# Patient Record
Sex: Female | Born: 1985 | Race: White | Hispanic: No | Marital: Single | Smoking: Current some day smoker
Health system: Southern US, Community
[De-identification: ages and names within clinical notes are randomized; demographics above are authoritative.]

## PROBLEM LIST (undated history)

## (undated) ENCOUNTER — Inpatient Hospital Stay (HOSPITAL_COMMUNITY): Payer: Self-pay

## (undated) ENCOUNTER — Inpatient Hospital Stay: Payer: Self-pay

## (undated) DIAGNOSIS — Z8489 Family history of other specified conditions: Secondary | ICD-10-CM

## (undated) DIAGNOSIS — K649 Unspecified hemorrhoids: Secondary | ICD-10-CM

## (undated) DIAGNOSIS — J45909 Unspecified asthma, uncomplicated: Secondary | ICD-10-CM

## (undated) DIAGNOSIS — R002 Palpitations: Secondary | ICD-10-CM

## (undated) DIAGNOSIS — Z720 Tobacco use: Secondary | ICD-10-CM

## (undated) DIAGNOSIS — B999 Unspecified infectious disease: Secondary | ICD-10-CM

## (undated) DIAGNOSIS — K219 Gastro-esophageal reflux disease without esophagitis: Secondary | ICD-10-CM

## (undated) DIAGNOSIS — R079 Chest pain, unspecified: Secondary | ICD-10-CM

## (undated) DIAGNOSIS — Z9289 Personal history of other medical treatment: Secondary | ICD-10-CM

## (undated) DIAGNOSIS — K5792 Diverticulitis of intestine, part unspecified, without perforation or abscess without bleeding: Secondary | ICD-10-CM

## (undated) DIAGNOSIS — Z973 Presence of spectacles and contact lenses: Secondary | ICD-10-CM

## (undated) DIAGNOSIS — R87629 Unspecified abnormal cytological findings in specimens from vagina: Secondary | ICD-10-CM

## (undated) DIAGNOSIS — I10 Essential (primary) hypertension: Secondary | ICD-10-CM

## (undated) HISTORY — DX: Palpitations: R00.2

## (undated) HISTORY — DX: Chest pain, unspecified: R07.9

## (undated) HISTORY — DX: Personal history of other medical treatment: Z92.89

## (undated) HISTORY — DX: Diverticulitis of intestine, part unspecified, without perforation or abscess without bleeding: K57.92

## (undated) HISTORY — DX: Tobacco use: Z72.0

## (undated) HISTORY — DX: Essential (primary) hypertension: I10

## (undated) HISTORY — PX: TONSILLECTOMY: SUR1361

---
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
## 2006-10-19 ENCOUNTER — Ambulatory Visit (HOSPITAL_COMMUNITY): Admission: RE | Admit: 2006-10-19 | Discharge: 2006-10-19 | Payer: Self-pay | Admitting: Obstetrics

## 2008-01-23 ENCOUNTER — Emergency Department (HOSPITAL_COMMUNITY): Admission: EM | Admit: 2008-01-23 | Discharge: 2008-01-23 | Payer: Self-pay | Admitting: *Deleted

## 2008-01-25 ENCOUNTER — Ambulatory Visit: Payer: Self-pay | Admitting: Gastroenterology

## 2008-01-25 DIAGNOSIS — R112 Nausea with vomiting, unspecified: Secondary | ICD-10-CM

## 2008-01-25 DIAGNOSIS — R197 Diarrhea, unspecified: Secondary | ICD-10-CM | POA: Insufficient documentation

## 2008-01-25 DIAGNOSIS — R110 Nausea: Secondary | ICD-10-CM | POA: Insufficient documentation

## 2008-01-25 LAB — CONVERTED CEMR LAB: Tissue Transglutaminase Ab, IgA: 0.3 units (ref <7)

## 2008-01-31 LAB — CONVERTED CEMR LAB
BUN: 11 mg/dL (ref 6–23)
CO2: 28 meq/L (ref 19–32)
Calcium: 9 mg/dL (ref 8.4–10.5)
Chloride: 107 meq/L (ref 96–112)
Creatinine, Ser: 0.5 mg/dL (ref 0.4–1.2)
GFR calc Af Amer: 198 mL/min
GFR calc non Af Amer: 164 mL/min
Glucose, Bld: 104 mg/dL — ABNORMAL HIGH (ref 70–99)
IgA: 249 mg/dL (ref 68–378)
Potassium: 3.3 meq/L — ABNORMAL LOW (ref 3.5–5.1)
Sed Rate: 24 mm/hr — ABNORMAL HIGH (ref 0–22)
Sodium: 140 meq/L (ref 135–145)
TSH: 1.64 microintl units/mL (ref 0.35–5.50)

## 2008-02-21 ENCOUNTER — Ambulatory Visit: Payer: Self-pay | Admitting: Gastroenterology

## 2008-02-21 LAB — CONVERTED CEMR LAB
BUN: 17 mg/dL (ref 6–23)
CO2: 30 meq/L (ref 19–32)
Calcium: 9.6 mg/dL (ref 8.4–10.5)
Chloride: 105 meq/L (ref 96–112)
Creatinine, Ser: 0.5 mg/dL (ref 0.4–1.2)
GFR calc Af Amer: 198 mL/min
GFR calc non Af Amer: 164 mL/min
Glucose, Bld: 93 mg/dL (ref 70–99)
Potassium: 4.4 meq/L (ref 3.5–5.1)
Sodium: 142 meq/L (ref 135–145)

## 2008-02-22 ENCOUNTER — Ambulatory Visit: Payer: Self-pay | Admitting: Gastroenterology

## 2010-02-04 NOTE — Assessment & Plan Note (Signed)
History of Present Illness Visit Type: consult Primary GI MD: Rob Bunting MD Primary Provider: Holley Bouche, MD Requesting Provider: Holley Bouche, MD Chief Complaint: diarrhea, vomiting History of Present Illness:      very pleasant 25 year old woman who had recent acute vomiting, and diarrheal illness.  Vomitting and diarrhea started acutely 3 days ago, occured 20 times or so.  No overt bleeding.  Began to slow down after went to ER (immodium, Phenergan, Zofran, hydrocodone for lower abdominal cramps were all given).  Was also told she also had a UTI and was given Abx. Ct of chest done for elevated d dimer and hyperventilation, was negative.  NEver had this happen before.  labs in the ER showed elevated Lawal count of 17,000, hemoconcentration (elevated hemoglobin), abnormal urinalysis.  since then her symptoms have definitely improved however last night she had some or diarrhea.  No known sick contacts.  For about a month prior to this, would have to move bowels fairly soon after eating meal.  Greasy meals definitely worse, red meat as well.  A year ago she would once or twice a day.  Has been drinking less caffeine lately.  No antibiotics in past several months except for UTI abx given by er 2-3 days ago.             Prior Medications Reviewed Using: Medication Bottles  Updated Prior Medication List: ZOFRAN ODT 8 MG TBDP (ONDANSETRON) 1 by mouth q 4-6 hours as needed NORCO 5-325 MG TABS (HYDROCODONE-ACETAMINOPHEN) 1-2 by mouth q 4-6 hours  as needed NITROFURANTOIN MACROCRYSTAL 100 MG CAPS (NITROFURANTOIN MACROCRYSTAL) Take 1 cap two times a day for seven days PHENAZOPYRIDINE HCL 200 MG TABS (PHENAZOPYRIDINE HCL) Take 1 tab three times a day for 2 days  Current Allergies: No known allergies   Past Medical History:    Unremarkable    recent obesity  Past Surgical History:    Tonsillectomy    Family History:    Grandmas sister has colon cancer    Diabetes  Heart disease  Social History:    she works as a Diplomatic Services operational officer at [employer], she is single, she has no children, she does not smoke cigarettes, she does not drink alcohol, she drinks one to 2 caffeinated drinks daily    Review of Systems       Pertinent positive and negative review of systems were noted in the above HPI and GI specific review of systems.  All other review of systems was otherwise negative.    Vital Signs:  Patient Profile:   25 Years Old Female Height:     63 inches Weight:      202.50 pounds BMI:     36.00 Pulse rate:   60 / minute Pulse rhythm:   regular BP sitting:   112 / 60  (left arm)  Vitals Entered By: June McMurray CMA (January 25, 2008 9:26 AM)                  Physical Exam  Constitutional: obese otherwise generally well appearing Psychiatric: alert and oriented times 3 Eyes: extraocular movements intact Mouth: oropharynx moist, no lesions Neck: supple, no lymphadenopathy Cardiovascular: heart regular rate and rythm Lungs: CTA bilaterally Abdomen: soft, non-tender, non-distended, no obvious ascites, no peritoneal signs, normal bowel sounds Extremities: no lower extremity edema bilaterally Skin: no lesions on visible extremities     Impression & Recommendations:  Problem # 1:  acute vomiting, diarrheal illness I suspect she had a viral  gastroenteritis. [employer] health commissioners just alerted physicians of another outbreak of [place] virus and so that may be the cause here. She is definitely improving I told her she should expect to be back to her usual within one to 2 more weeks. She can continue her Imodium p.r.n. as well as her Zofran p.r.n.  Problem # 2:  post prandial loose stools for the past 2-3 months she has had postprandial urgency and loose stools. Perhaps she has new lactose intolerance. Perhaps she is developing IBD. I will arrange for her to get a basic set of labs today including a basic metabolic profile,  sedimentation rate, TTG, IgA. She should continue Imodium as needed for now. She will try a lactose free diet in about 2 weeks time to give her further time to recover from her acute illness. If that does not show that she is lactose intolerant then she will begin keeping a food diary. She'll return to see me in 4-6 weeks and sooner if needed.  Other Orders: TLB-Sedimentation Rate (ESR) (85652-ESR) TLB-BMP (Basic Metabolic Panel-BMET) (80048-METABOL) TLB-TSH (Thyroid Stimulating Hormone) (84443-TSH) TLB-IgA (Immunoglobulin A) (82784-IGA) T-Tissue Transglutamase Ab IgA ([id number])   Patient Instructions: 1)  In 2 weeks, completely avoid dairy for 5 days. If you feel better, then have a large dairy meal...if you feel bad again then you are lactose intolerant. 2)  If no changes with dairy free diet, then keep a food diary. 3)  Please schedule a follow-up appointment in 4 to 6 weeks. 4)  You will get lab test(s) done today (esr, bmet, tsh, ttg, Iga).

## 2010-02-04 NOTE — Assessment & Plan Note (Signed)
  GI problem list: 1. Likely viral gastroenteritis, January 2010. Presented with acute vomiting and diarrheal illness that subsided within 2-3 days. Residual dyspepsia, nausea for one to 2 weeks.    History of Present Illness Primary GI MD: Rob Bunting MD Primary Provider: Holley Bouche, MD Requesting Provider: n/a Chief Complaint: 4 week f/u History of Present Illness:     She is perfectly normal now.  No vomitting, no nauseas, no diarrhea.  The mild dyspepsia leading up to her acute GE has also subsided.           Updated Prior Medication List: No Medications Current Allergies (reviewed today): No known allergies   Vital Signs:  Patient Profile:   25 Years Old Female Height:     63 inches Weight:      199.50 pounds BMI:     35.47 Pulse rate:   60 / minute Pulse rhythm:   regular BP sitting:   142 / 64  (right arm) Cuff size:   regular  Vitals Entered By: Christie Nottingham CMA (February 22, 2008 9:35 AM)                  Physical Exam  Constitutional: generally well appearing Psychiatric: alert and oriented times 3 Abdomen: soft, non-tender, non-distended, normal bowel sounds    Impression & Recommendations:  Problem # 1:  DIARRHEA, nausea vomiting I suspect she indeed had a viral gastroenteritis last month. Her severe symptoms resolved after one to 2 days. She was left with post infectious dyspepsia, nausea, slightly loose stools. This is not uncommon. Fortunately her symptoms have completely resolved and she is back to normal. She will return to see me as needed.   Patient Instructions: 1)  Return to see Dr. Christella Hartigan as needed.

## 2010-03-06 ENCOUNTER — Emergency Department (HOSPITAL_COMMUNITY)
Admission: EM | Admit: 2010-03-06 | Discharge: 2010-03-06 | Disposition: A | Discharge status: Home / Self Care | Payer: BC Managed Care – PPO | Attending: Emergency Medicine | Admitting: Emergency Medicine

## 2010-03-06 DIAGNOSIS — R1915 Other abnormal bowel sounds: Secondary | ICD-10-CM | POA: Insufficient documentation

## 2010-03-06 DIAGNOSIS — R197 Diarrhea, unspecified: Secondary | ICD-10-CM | POA: Insufficient documentation

## 2010-03-06 DIAGNOSIS — R109 Unspecified abdominal pain: Secondary | ICD-10-CM | POA: Insufficient documentation

## 2010-03-06 DIAGNOSIS — R112 Nausea with vomiting, unspecified: Secondary | ICD-10-CM | POA: Insufficient documentation

## 2010-03-06 DIAGNOSIS — E860 Dehydration: Secondary | ICD-10-CM | POA: Insufficient documentation

## 2010-03-06 LAB — CBC
HCT: 38.2 % (ref 36.0–46.0)
Hemoglobin: 13.4 g/dL (ref 12.0–15.0)
MCH: 30.9 pg (ref 26.0–34.0)
MCHC: 35.1 g/dL (ref 30.0–36.0)
MCV: 88.2 fL (ref 78.0–100.0)
Platelets: 258 10*3/uL (ref 150–400)
RBC: 4.33 MIL/uL (ref 3.87–5.11)
RDW: 12.2 % (ref 11.5–15.5)
WBC: 13 10*3/uL — ABNORMAL HIGH (ref 4.0–10.5)

## 2010-03-06 LAB — BASIC METABOLIC PANEL
BUN: 10 mg/dL (ref 6–23)
CO2: 24 mEq/L (ref 19–32)
Calcium: 9.9 mg/dL (ref 8.4–10.5)
Chloride: 101 mEq/L (ref 96–112)
Creatinine, Ser: 0.74 mg/dL (ref 0.4–1.2)
GFR calc Af Amer: >60 mL/min (ref >60)
GFR calc non Af Amer: >60 mL/min (ref >60)
Glucose, Bld: 144 mg/dL — ABNORMAL HIGH (ref 70–99)
Potassium: 3.5 mEq/L (ref 3.5–5.1)
Sodium: 138 mEq/L (ref 135–145)

## 2010-03-06 LAB — DIFFERENTIAL
Basophils Absolute: 0 10*3/uL (ref 0.0–0.1)
Basophils Relative: 0 % (ref 0–1)
Eosinophils Absolute: 0 10*3/uL (ref 0.0–0.7)
Eosinophils Relative: 0 % (ref 0–5)
Lymphocytes Relative: 10 % — ABNORMAL LOW (ref 12–46)
Lymphs Abs: 1.3 10*3/uL (ref 0.7–4.0)
Monocytes Absolute: 0.5 10*3/uL (ref 0.1–1.0)
Monocytes Relative: 4 % (ref 3–12)
Neutro Abs: 11.1 10*3/uL — ABNORMAL HIGH (ref 1.7–7.7)
Neutrophils Relative %: 86 % — ABNORMAL HIGH (ref 43–77)

## 2010-03-06 LAB — HEPATIC FUNCTION PANEL
ALT: 37 U/L — ABNORMAL HIGH (ref 0–35)
AST: 26 U/L (ref 0–37)
Albumin: 4.2 g/dL (ref 3.5–5.2)
Alkaline Phosphatase: 82 U/L (ref 39–117)
Bilirubin, Direct: <0.1 mg/dL (ref 0.0–0.3)
Total Bilirubin: 0.7 mg/dL (ref 0.3–1.2)
Total Protein: 7.5 g/dL (ref 6.0–8.3)

## 2010-03-06 LAB — URINALYSIS, ROUTINE W REFLEX MICROSCOPIC
Bilirubin Urine: NEGATIVE
Ketones, ur: >80 mg/dL — AB
Leukocytes, UA: NEGATIVE
Nitrite: NEGATIVE
Protein, ur: 100 mg/dL — AB
Specific Gravity, Urine: 1.025 (ref 1.005–1.030)
Urine Glucose, Fasting: NEGATIVE mg/dL
Urobilinogen, UA: 0.2 mg/dL (ref 0.0–1.0)
pH: 8.5 — ABNORMAL HIGH (ref 5.0–8.0)

## 2010-03-06 LAB — LIPASE, BLOOD: Lipase: 23 U/L (ref 11–59)

## 2010-03-06 LAB — URINE MICROSCOPIC-ADD ON

## 2010-04-21 LAB — URINE MICROSCOPIC-ADD ON

## 2010-04-21 LAB — POCT I-STAT, CHEM 8
BUN: 16 mg/dL (ref 6–23)
Calcium, Ion: 1.11 mmol/L — ABNORMAL LOW (ref 1.12–1.32)
Chloride: 107 mEq/L (ref 96–112)
Creatinine, Ser: 0.8 mg/dL (ref 0.4–1.2)
Glucose, Bld: 163 mg/dL — ABNORMAL HIGH (ref 70–99)
HCT: 44 % (ref 36.0–46.0)
Hemoglobin: 15 g/dL (ref 12.0–15.0)
Potassium: 3.7 mEq/L (ref 3.5–5.1)
Sodium: 142 mEq/L (ref 135–145)
TCO2: 24 mmol/L (ref 0–100)

## 2010-04-21 LAB — URINALYSIS, ROUTINE W REFLEX MICROSCOPIC
Bilirubin Urine: NEGATIVE
Glucose, UA: NEGATIVE mg/dL
Ketones, ur: 15 mg/dL — AB
Nitrite: NEGATIVE
Protein, ur: 30 mg/dL — AB
Specific Gravity, Urine: 1.03 (ref 1.005–1.030)
Urobilinogen, UA: 0.2 mg/dL (ref 0.0–1.0)
pH: 6 (ref 5.0–8.0)

## 2010-04-21 LAB — DIFFERENTIAL
Basophils Absolute: 0 10*3/uL (ref 0.0–0.1)
Basophils Relative: 0 % (ref 0–1)
Eosinophils Absolute: 0.1 10*3/uL (ref 0.0–0.7)
Eosinophils Relative: 1 % (ref 0–5)
Lymphocytes Relative: 8 % — ABNORMAL LOW (ref 12–46)
Lymphs Abs: 1.4 10*3/uL (ref 0.7–4.0)
Monocytes Absolute: 0.6 10*3/uL (ref 0.1–1.0)
Monocytes Relative: 4 % (ref 3–12)
Neutro Abs: 14.8 10*3/uL — ABNORMAL HIGH (ref 1.7–7.7)
Neutrophils Relative %: 88 % — ABNORMAL HIGH (ref 43–77)

## 2010-04-21 LAB — D-DIMER, QUANTITATIVE: D-Dimer, Quant: 0.99 ug/mL-FEU — ABNORMAL HIGH (ref 0.00–0.48)

## 2010-04-21 LAB — CBC
HCT: 45.9 % (ref 36.0–46.0)
Hemoglobin: 15.4 g/dL — ABNORMAL HIGH (ref 12.0–15.0)
MCHC: 33.5 g/dL (ref 30.0–36.0)
MCV: 93.5 fL (ref 78.0–100.0)
Platelets: 283 10*3/uL (ref 150–400)
RBC: 4.91 MIL/uL (ref 3.87–5.11)
RDW: 12.7 % (ref 11.5–15.5)
WBC: 16.9 10*3/uL — ABNORMAL HIGH (ref 4.0–10.5)

## 2010-04-21 LAB — POCT PREGNANCY, URINE: Preg Test, Ur: NEGATIVE

## 2011-04-06 ENCOUNTER — Emergency Department (INDEPENDENT_AMBULATORY_CARE_PROVIDER_SITE_OTHER)
Admission: EM | Admit: 2011-04-06 | Discharge: 2011-04-06 | Disposition: A | Discharge status: Home / Self Care | Payer: BC Managed Care – PPO | Source: Home / Self Care | Attending: Family Medicine | Admitting: Family Medicine

## 2011-04-06 ENCOUNTER — Encounter (HOSPITAL_COMMUNITY): Payer: Self-pay | Admitting: *Deleted

## 2011-04-06 DIAGNOSIS — J302 Other seasonal allergic rhinitis: Secondary | ICD-10-CM

## 2011-04-06 DIAGNOSIS — H6690 Otitis media, unspecified, unspecified ear: Secondary | ICD-10-CM

## 2011-04-06 DIAGNOSIS — J309 Allergic rhinitis, unspecified: Secondary | ICD-10-CM

## 2011-04-06 DIAGNOSIS — H6691 Otitis media, unspecified, right ear: Secondary | ICD-10-CM

## 2011-04-06 MED ORDER — FEXOFENADINE HCL 180 MG PO TABS: 180.0000 mg | ORAL_TABLET | Freq: Every day | ORAL | Status: DC

## 2011-04-06 MED ORDER — FLUTICASONE PROPIONATE 50 MCG/ACT NA SUSP: 2.0000 | Freq: Every day | NASAL | Status: DC

## 2011-04-06 MED ORDER — AMOXICILLIN 500 MG PO CAPS
500.0000 mg | ORAL_CAPSULE | Freq: Three times a day (TID) | ORAL | Status: AC
Start: 2011-04-06 — End: 2011-04-16

## 2011-04-06 NOTE — ED Provider Notes (Addendum)
History     CSN: [id number]  Arrival date & time 04/06/11  1909   First MD Initiated Contact with Patient 04/06/11 1912      Chief Complaint  Patient presents with  . Otalgia    (Consider location/radiation/quality/duration/timing/severity/associated sxs/prior treatment) Patient is a 26 y.o. female presenting with ear pain. The history is provided by the patient and a parent.  Otalgia This is a new problem. The current episode started more than 2 days ago. There is pain in the right (took amox for 5 days last week when left ear became painful, sx improved then moved to right ear , assoc with  seasonal allergy cong.) ear. The problem has been gradually worsening. There has been no fever. The pain is moderate. Associated symptoms include rhinorrhea. Pertinent negatives include no ear discharge.    History reviewed. No pertinent past medical history.  History reviewed. No pertinent past surgical history.  Family History  Problem Relation Age of Onset  . Hypertension Other     History  Substance Use Topics  . Smoking status: Not on file  . Smokeless tobacco: Not on file  . Alcohol Use:     OB History    Grav Para Term Preterm Abortions TAB SAB Ect Mult Living                  Review of Systems  Constitutional: Negative.   HENT: Positive for ear pain, congestion, rhinorrhea, sneezing and postnasal drip. Negative for ear discharge.     Allergies  Review of patient's allergies indicates no known allergies.  Home Medications   Current Outpatient Rx  Name Route Sig Dispense Refill  . ACETAMINOPHEN 500 MG PO TABS Oral Take 500 mg by mouth every 6 (six) hours as needed.    . AMOXICILLIN 250 MG PO CAPS Oral Take 250 mg by mouth 3 (three) times daily.    . AMOXICILLIN 500 MG PO CAPS Oral Take 1 capsule (500 mg total) by mouth 3 (three) times daily. 30 capsule 0  . FEXOFENADINE HCL 180 MG PO TABS Oral Take 1 tablet (180 mg total) by mouth daily. 30 tablet 2  . FLUTICASONE  PROPIONATE 50 MCG/ACT NA SUSP Nasal Place 2 sprays into the nose daily. 1 g 2    BP 146/88  Pulse 60  Temp(Src) 98.1 F (36.7 C) (Oral)  Resp 16  SpO2 100%  LMP 03/29/2011  Physical Exam  Nursing note and vitals reviewed. Constitutional: She appears well-developed and well-nourished.  HENT:  Head: Normocephalic.  Right Ear: External ear and ear canal normal. Tympanic membrane is injected, erythematous and bulging. A middle ear effusion is present.  Left Ear: External ear and ear canal normal. Tympanic membrane is not injected, not erythematous and not bulging. A middle ear effusion is present.  Nose: Mucosal edema and rhinorrhea present.  Mouth/Throat: Oropharynx is clear and moist.  Pulmonary/Chest: Breath sounds normal.  Skin: Skin is warm and dry.    ED Course  Procedures (including critical care time)  Labs Reviewed - No data to display No results found.   1. Otitis media of right ear   2. Seasonal allergic rhinitis       MDM          Linna Hoff, MD 04/06/11 [id number]  Linna Hoff, MD 04/06/11 2050

## 2011-04-06 NOTE — ED Notes (Signed)
Pt  Reports  She  Has  Had   An earache  r  Side      Associated  With  Symptoms  Of  Upper  resp  Congestion  Sinus  stuffyness       X   1  Week  She  Reports  She  Has  Been taking OTC meds   As  Well  As  amox  She  Had         On hand    She  Exhibits normal  behabviourr  Sitting  Upright on exam table  Speaking in  Complete  sentances  In no severe  distress

## 2012-10-01 ENCOUNTER — Encounter (HOSPITAL_COMMUNITY): Payer: Self-pay

## 2012-10-01 ENCOUNTER — Emergency Department (HOSPITAL_COMMUNITY)
Admission: EM | Admit: 2012-10-01 | Discharge: 2012-10-01 | Disposition: A | Discharge status: Home / Self Care | Payer: BC Managed Care – PPO | Attending: Emergency Medicine | Admitting: Emergency Medicine

## 2012-10-01 DIAGNOSIS — F17200 Nicotine dependence, unspecified, uncomplicated: Secondary | ICD-10-CM | POA: Insufficient documentation

## 2012-10-01 DIAGNOSIS — R112 Nausea with vomiting, unspecified: Secondary | ICD-10-CM | POA: Insufficient documentation

## 2012-10-01 DIAGNOSIS — Z3202 Encounter for pregnancy test, result negative: Secondary | ICD-10-CM | POA: Insufficient documentation

## 2012-10-01 DIAGNOSIS — R1013 Epigastric pain: Secondary | ICD-10-CM | POA: Insufficient documentation

## 2012-10-01 LAB — PREGNANCY, URINE: Preg Test, Ur: NEGATIVE

## 2012-10-01 LAB — COMPREHENSIVE METABOLIC PANEL
ALT: 36 U/L — ABNORMAL HIGH (ref 0–35)
AST: 22 U/L (ref 0–37)
Albumin: 4.5 g/dL (ref 3.5–5.2)
Alkaline Phosphatase: 78 U/L (ref 39–117)
BUN: 12 mg/dL (ref 6–23)
CO2: 25 mEq/L (ref 19–32)
Calcium: 9.6 mg/dL (ref 8.4–10.5)
Chloride: 97 mEq/L (ref 96–112)
Creatinine, Ser: 0.51 mg/dL (ref 0.50–1.10)
GFR calc Af Amer: >90 mL/min (ref >90)
GFR calc non Af Amer: >90 mL/min (ref >90)
Glucose, Bld: 127 mg/dL — ABNORMAL HIGH (ref 70–99)
Potassium: 3.5 mEq/L (ref 3.5–5.1)
Sodium: 136 mEq/L (ref 135–145)
Total Bilirubin: 0.4 mg/dL (ref 0.3–1.2)
Total Protein: 8.2 g/dL (ref 6.0–8.3)

## 2012-10-01 LAB — CBC
HCT: 39.2 % (ref 36.0–46.0)
Hemoglobin: 13.6 g/dL (ref 12.0–15.0)
MCH: 31.2 pg (ref 26.0–34.0)
MCHC: 34.7 g/dL (ref 30.0–36.0)
MCV: 89.9 fL (ref 78.0–100.0)
Platelets: 289 10*3/uL (ref 150–400)
RBC: 4.36 MIL/uL (ref 3.87–5.11)
RDW: 12.5 % (ref 11.5–15.5)
WBC: 10.2 10*3/uL (ref 4.0–10.5)

## 2012-10-01 LAB — LIPASE, BLOOD: Lipase: 16 U/L (ref 11–59)

## 2012-10-01 MED ORDER — ONDANSETRON HCL 4 MG/2ML IJ SOLN
4.0000 mg | Freq: Once | INTRAMUSCULAR | Status: AC
  Administered 2012-10-01: 4 mg via INTRAVENOUS
  Filled 2012-10-01: qty 2

## 2012-10-01 MED ORDER — MORPHINE SULFATE 4 MG/ML IJ SOLN
4.0000 mg | Freq: Once | INTRAMUSCULAR | Status: AC
  Administered 2012-10-01: 4 mg via INTRAVENOUS
  Filled 2012-10-01: qty 1

## 2012-10-01 MED ORDER — SODIUM CHLORIDE 0.9 % IV BOLUS (SEPSIS)
1000.0000 mL | Freq: Once | INTRAVENOUS | Status: AC
  Administered 2012-10-01: 1000 mL via INTRAVENOUS

## 2012-10-01 MED ORDER — OMEPRAZOLE 20 MG PO CPDR: 20.0000 mg | DELAYED_RELEASE_CAPSULE | Freq: Every day | ORAL | Status: DC

## 2012-10-01 MED ORDER — SUCRALFATE 1 G PO TABS: 1.0000 g | ORAL_TABLET | Freq: Four times a day (QID) | ORAL | Status: DC

## 2012-10-01 MED ORDER — GI COCKTAIL ~~LOC~~
30.0000 mL | Freq: Once | ORAL | Status: AC
  Administered 2012-10-01: 30 mL via ORAL
  Filled 2012-10-01: qty 30

## 2012-10-01 MED ORDER — PANTOPRAZOLE SODIUM 40 MG IV SOLR
40.0000 mg | Freq: Once | INTRAVENOUS | Status: AC
  Administered 2012-10-01: 40 mg via INTRAVENOUS
  Filled 2012-10-01: qty 40

## 2012-10-01 NOTE — ED Notes (Signed)
Pt c/o epigastric pain x last few months.  Starting at 0200 today she has been vomiting.  Has had cold chills.  Also c/o diarrhea.

## 2012-10-01 NOTE — ED Notes (Signed)
Per pt, n/v/d since this am around 0230. Pt states "pees when coughing" and at least 10 episodes of vomiting today. Pt states episode of diarrhea this am but not at present time.  Pt states with "dye heaves nose numbness and curling of hands with vomiting." Pt states being "sweaty and clammy." Pt states constant pain in epigastric area since this am. Pt denies any other symptoms at present time.

## 2012-10-01 NOTE — ED Notes (Signed)
EDP notified that pt had HR in 40s while resting and when up talking HR in 80s.

## 2012-10-01 NOTE — ED Provider Notes (Signed)
CSN: [id number]     Arrival date & time 10/01/12  1336 History   First MD Initiated Contact with Patient 10/01/12 1459     Chief Complaint  Patient presents with  . Abdominal Pain  . Emesis   (Consider location/radiation/quality/duration/timing/severity/associated sxs/prior Treatment) HPI Patient presents with concern of abdominal pain, nausea, vomiting. She has had abdominal pain for approximately one month.  There was no clear precipitant.  Over the past month she has had episodic epigastric severe pain.  This seems to be correlation with intake of fatty food. However, the patient denies other specific alleviating or exacerbating factors. Approximately 12 hours ago the pain became more severe.  It is now persistent.  There is associated with persistent nausea with innumerable episodes of emesis. No fever, no diarrhea, no other abdominal pain, no chest pain. Patient occasionally smokes, occasionally drinks, has no prior medical problems.  History reviewed. No pertinent past medical history. Past Surgical History  Procedure Laterality Date  . Tonsillectomy     Family History  Problem Relation Age of Onset  . Hypertension Other    History  Substance Use Topics  . Smoking status: Current Some Day Smoker    Types: Cigarettes  . Smokeless tobacco: Not on file  . Alcohol Use: Yes     Comment: occasionally   OB History   Grav Para Term Preterm Abortions TAB SAB Ect Mult Living                 Review of Systems  Constitutional:       Per HPI, otherwise negative  HENT:       Per HPI, otherwise negative  Respiratory:       Per HPI, otherwise negative  Cardiovascular:       Per HPI, otherwise negative  Gastrointestinal: Positive for nausea and vomiting.  Endocrine:       Negative aside from HPI  Genitourinary:       Neg aside from HPI   Musculoskeletal:       Per HPI, otherwise negative  Skin: Negative.   Neurological: Negative for syncope.    Allergies  Review of  patient's allergies indicates no known allergies.  Home Medications  No current outpatient prescriptions on file. BP 139/83  Pulse 86  Temp(Src) 97.6 F (36.4 C) (Oral)  Resp 16  Ht 5\' 3"  (1.6 m)  Wt 200 lb (90.719 kg)  BMI 35.44 kg/m2  SpO2 99%  LMP 09/19/2012 Physical Exam  Nursing note and vitals reviewed. Constitutional: She is oriented to person, place, and time. She appears well-developed and well-nourished. No distress.  HENT:  Head: Normocephalic and atraumatic.  Eyes: Conjunctivae and EOM are normal.  Cardiovascular: Normal rate and regular rhythm.   Pulmonary/Chest: Effort normal and breath sounds normal. No stridor. No respiratory distress.  Abdominal: She exhibits no distension. There is no hepatosplenomegaly. There is tenderness in the epigastric area. There is guarding. There is no rigidity, no rebound and no CVA tenderness.  Musculoskeletal: She exhibits no edema.  Neurological: She is alert and oriented to person, place, and time. No cranial nerve deficit.  Skin: Skin is warm and dry.  Psychiatric: She has a normal mood and affect.    ED Course  Procedures (including critical care time) Labs Review Labs Reviewed  CBC  COMPREHENSIVE METABOLIC PANEL  LIPASE, BLOOD  PREGNANCY, URINE   Imaging Review No results found.   5:22 PM Patient resting comfortably.  We discussed all results.  MDM  No diagnosis  found. This generally well-appearing female presents with worsening epigastric pain, new nausea and vomiting. On exam patient is tenderness about the epigastrium, but is in no distress, is awake, alert, afebrile, hemodynamically stable. With minimal risk factors, and her young age, there is low suspicion for ACS or other cardiopulmonary etiology. Patient improved here with initiation of antacid therapy. Patient was discharged with followup instructions and return precautions after initiation of antacid therapy.    Gerhard Munch, MD 10/01/12 1723

## 2012-10-24 ENCOUNTER — Emergency Department (HOSPITAL_COMMUNITY)
Admission: EM | Admit: 2012-10-24 | Discharge: 2012-10-25 | Disposition: A | Discharge status: Home / Self Care | Payer: BC Managed Care – PPO | Attending: Emergency Medicine | Admitting: Emergency Medicine

## 2012-10-24 ENCOUNTER — Encounter (HOSPITAL_COMMUNITY): Payer: Self-pay | Admitting: Emergency Medicine

## 2012-10-24 DIAGNOSIS — R197 Diarrhea, unspecified: Secondary | ICD-10-CM | POA: Insufficient documentation

## 2012-10-24 DIAGNOSIS — F17200 Nicotine dependence, unspecified, uncomplicated: Secondary | ICD-10-CM | POA: Insufficient documentation

## 2012-10-24 DIAGNOSIS — Z3202 Encounter for pregnancy test, result negative: Secondary | ICD-10-CM | POA: Insufficient documentation

## 2012-10-24 DIAGNOSIS — IMO0001 Reserved for inherently not codable concepts without codable children: Secondary | ICD-10-CM | POA: Insufficient documentation

## 2012-10-24 DIAGNOSIS — R1013 Epigastric pain: Secondary | ICD-10-CM

## 2012-10-24 DIAGNOSIS — R112 Nausea with vomiting, unspecified: Secondary | ICD-10-CM | POA: Insufficient documentation

## 2012-10-24 NOTE — ED Notes (Signed)
Pt states she woke up this morning around 8am feeling sick  Pt states since then she has been vomiting  Pt is c/o burning and pain in her epigastric area

## 2012-10-25 ENCOUNTER — Emergency Department (HOSPITAL_COMMUNITY): Payer: BC Managed Care – PPO

## 2012-10-25 LAB — COMPREHENSIVE METABOLIC PANEL
ALT: 44 U/L — ABNORMAL HIGH (ref 0–35)
AST: 30 U/L (ref 0–37)
Albumin: 4.4 g/dL (ref 3.5–5.2)
Alkaline Phosphatase: 88 U/L (ref 39–117)
BUN: 16 mg/dL (ref 6–23)
CO2: 27 mEq/L (ref 19–32)
Calcium: 10.3 mg/dL (ref 8.4–10.5)
Chloride: 97 mEq/L (ref 96–112)
Creatinine, Ser: 0.59 mg/dL (ref 0.50–1.10)
GFR calc Af Amer: >90 mL/min (ref >90)
GFR calc non Af Amer: >90 mL/min (ref >90)
Glucose, Bld: 135 mg/dL — ABNORMAL HIGH (ref 70–99)
Potassium: 3.5 mEq/L (ref 3.5–5.1)
Sodium: 138 mEq/L (ref 135–145)
Total Bilirubin: 0.3 mg/dL (ref 0.3–1.2)
Total Protein: 8.6 g/dL — ABNORMAL HIGH (ref 6.0–8.3)

## 2012-10-25 LAB — URINALYSIS, ROUTINE W REFLEX MICROSCOPIC
Bilirubin Urine: NEGATIVE
Glucose, UA: 100 mg/dL — AB
Ketones, ur: NEGATIVE mg/dL
Leukocytes, UA: NEGATIVE
Nitrite: NEGATIVE
Protein, ur: 100 mg/dL — AB
Specific Gravity, Urine: 1.031 — ABNORMAL HIGH (ref 1.005–1.030)
Urobilinogen, UA: 0.2 mg/dL (ref 0.0–1.0)
pH: 8 (ref 5.0–8.0)

## 2012-10-25 LAB — CBC WITH DIFFERENTIAL/PLATELET
Basophils Absolute: 0 10*3/uL (ref 0.0–0.1)
Basophils Relative: 0 % (ref 0–1)
Eosinophils Absolute: 0 10*3/uL (ref 0.0–0.7)
Eosinophils Relative: 0 % (ref 0–5)
HCT: 43.7 % (ref 36.0–46.0)
Hemoglobin: 15.3 g/dL — ABNORMAL HIGH (ref 12.0–15.0)
Lymphocytes Relative: 10 % — ABNORMAL LOW (ref 12–46)
Lymphs Abs: 1.5 10*3/uL (ref 0.7–4.0)
MCH: 31.5 pg (ref 26.0–34.0)
MCHC: 35 g/dL (ref 30.0–36.0)
MCV: 89.9 fL (ref 78.0–100.0)
Monocytes Absolute: 0.9 10*3/uL (ref 0.1–1.0)
Monocytes Relative: 5 % (ref 3–12)
Neutro Abs: 13.8 10*3/uL — ABNORMAL HIGH (ref 1.7–7.7)
Neutrophils Relative %: 85 % — ABNORMAL HIGH (ref 43–77)
Platelets: 282 10*3/uL (ref 150–400)
RBC: 4.86 MIL/uL (ref 3.87–5.11)
RDW: 12.5 % (ref 11.5–15.5)
WBC: 16.2 10*3/uL — ABNORMAL HIGH (ref 4.0–10.5)

## 2012-10-25 LAB — URINE MICROSCOPIC-ADD ON

## 2012-10-25 LAB — POCT PREGNANCY, URINE: Preg Test, Ur: NEGATIVE

## 2012-10-25 LAB — LIPASE, BLOOD: Lipase: 18 U/L (ref 11–59)

## 2012-10-25 MED ORDER — GI COCKTAIL ~~LOC~~
30.0000 mL | Freq: Once | ORAL | Status: AC
  Administered 2012-10-25: 30 mL via ORAL
  Filled 2012-10-25: qty 30

## 2012-10-25 MED ORDER — ONDANSETRON HCL 4 MG PO TABS: 4.0000 mg | ORAL_TABLET | Freq: Four times a day (QID) | ORAL | Status: DC

## 2012-10-25 MED ORDER — SODIUM CHLORIDE 0.9 % IV BOLUS (SEPSIS)
1000.0000 mL | Freq: Once | INTRAVENOUS | Status: AC
  Administered 2012-10-25: 1000 mL via INTRAVENOUS

## 2012-10-25 MED ORDER — PROMETHAZINE HCL 25 MG/ML IJ SOLN
12.5000 mg | Freq: Once | INTRAMUSCULAR | Status: AC
  Administered 2012-10-25: 12.5 mg via INTRAVENOUS
  Filled 2012-10-25: qty 1

## 2012-10-25 MED ORDER — ONDANSETRON HCL 4 MG/2ML IJ SOLN
4.0000 mg | Freq: Once | INTRAMUSCULAR | Status: AC
  Administered 2012-10-25: 4 mg via INTRAVENOUS
  Filled 2012-10-25: qty 2

## 2012-10-25 NOTE — ED Provider Notes (Signed)
CSN: [id number]     Arrival date & time 10/24/12  2301 History   First MD Initiated Contact with Patient 10/25/12 0125     Chief Complaint  Patient presents with  . Emesis  . Abdominal Pain   (Consider location/radiation/quality/duration/timing/severity/associated sxs/prior Treatment) Patient is a 27 y.o. female presenting with vomiting and abdominal pain. The history is provided by the patient. No language interpreter was used.  Emesis Severity:  Moderate Duration:  1 day Associated symptoms: abdominal pain, diarrhea and myalgias   Associated symptoms comment:  She started having symptoms of N, V, D early this morning after having a normal day yesterday. The diarrhea subsided but the N, V persisted throughout the day. No fever. She reports epigastric pain, worse with vomiting but persistent pain. No hematemesis or bloody bowel movements. Abdominal Pain Associated symptoms: diarrhea, nausea and vomiting   Associated symptoms: no fever     History reviewed. No pertinent past medical history. Past Surgical History  Procedure Laterality Date  . Tonsillectomy     Family History  Problem Relation Age of Onset  . Hypertension Other   . Cancer Other    History  Substance Use Topics  . Smoking status: Current Some Day Smoker    Types: Cigarettes  . Smokeless tobacco: Not on file  . Alcohol Use: Yes     Comment: occasionally   OB History   Grav Para Term Preterm Abortions TAB SAB Ect Mult Living                 Review of Systems  Constitutional: Negative for fever.  Respiratory: Negative.   Cardiovascular: Negative.   Gastrointestinal: Positive for nausea, vomiting, abdominal pain and diarrhea. Negative for blood in stool.  Musculoskeletal: Positive for myalgias.  Neurological: Negative for light-headedness.    Allergies  Review of patient's allergies indicates no known allergies.  Home Medications  No current outpatient prescriptions on file. BP 143/78  Pulse 91   Temp(Src) 98.4 F (36.9 C) (Oral)  Resp 20  Ht 5\' 3"  (1.6 m)  Wt 205 lb (92.987 kg)  BMI 36.32 kg/m2  SpO2 97%  LMP 09/19/2012 Physical Exam  Constitutional: She is oriented to person, place, and time. She appears well-developed and well-nourished.  HENT:  Head: Normocephalic.  Neck: Normal range of motion. Neck supple.  Cardiovascular: Normal rate and regular rhythm.   No murmur heard. Pulmonary/Chest: Effort normal and breath sounds normal.  Abdominal: Soft. Bowel sounds are normal. There is tenderness. There is no rebound and no guarding.  Epigastric tenderness without mass or guarding.   Musculoskeletal: Normal range of motion.  Neurological: She is alert and oriented to person, place, and time. A cranial nerve deficit is present.  Skin: Skin is warm and dry. No rash noted.  Psychiatric: She has a normal mood and affect.    ED Course  Procedures (including critical care time) Labs Review Labs Reviewed  CBC WITH DIFFERENTIAL - Abnormal; Notable for the following:    WBC 16.2 (*)    Hemoglobin 15.3 (*)    Neutrophils Relative % 85 (*)    Neutro Abs 13.8 (*)    Lymphocytes Relative 10 (*)    All other components within normal limits  COMPREHENSIVE METABOLIC PANEL - Abnormal; Notable for the following:    Glucose, Bld 135 (*)    Total Protein 8.6 (*)    ALT 44 (*)    All other components within normal limits  LIPASE, BLOOD  CBC WITH DIFFERENTIAL  URINALYSIS, ROUTINE W REFLEX MICROSCOPIC  POCT PREGNANCY, URINE   Results for orders placed during the hospital encounter of 10/24/12  URINALYSIS, ROUTINE W REFLEX MICROSCOPIC      Result Value Range   Color, Urine YELLOW  YELLOW   APPearance CLOUDY (*) CLEAR   Specific Gravity, Urine 1.031 (*) 1.005 - 1.030   pH 8.0  5.0 - 8.0   Glucose, UA 100 (*) NEGATIVE mg/dL   Hgb urine dipstick SMALL (*) NEGATIVE   Bilirubin Urine NEGATIVE  NEGATIVE   Ketones, ur NEGATIVE  NEGATIVE mg/dL   Protein, ur 478 (*) NEGATIVE mg/dL    Urobilinogen, UA 0.2  0.0 - 1.0 mg/dL   Nitrite NEGATIVE  NEGATIVE   Leukocytes, UA NEGATIVE  NEGATIVE  CBC WITH DIFFERENTIAL      Result Value Range   WBC 16.2 (*) 4.0 - 10.5 K/uL   RBC 4.86  3.87 - 5.11 MIL/uL   Hemoglobin 15.3 (*) 12.0 - 15.0 g/dL   HCT 29.5  62.1 - 30.8 %   MCV 89.9  78.0 - 100.0 fL   MCH 31.5  26.0 - 34.0 pg   MCHC 35.0  30.0 - 36.0 g/dL   RDW 65.7  84.6 - 96.2 %   Platelets 282  150 - 400 K/uL   Neutrophils Relative % 85 (*) 43 - 77 %   Neutro Abs 13.8 (*) 1.7 - 7.7 K/uL   Lymphocytes Relative 10 (*) 12 - 46 %   Lymphs Abs 1.5  0.7 - 4.0 K/uL   Monocytes Relative 5  3 - 12 %   Monocytes Absolute 0.9  0.1 - 1.0 K/uL   Eosinophils Relative 0  0 - 5 %   Eosinophils Absolute 0.0  0.0 - 0.7 K/uL   Basophils Relative 0  0 - 1 %   Basophils Absolute 0.0  0.0 - 0.1 K/uL  COMPREHENSIVE METABOLIC PANEL      Result Value Range   Sodium 138  135 - 145 mEq/L   Potassium 3.5  3.5 - 5.1 mEq/L   Chloride 97  96 - 112 mEq/L   CO2 27  19 - 32 mEq/L   Glucose, Bld 135 (*) 70 - 99 mg/dL   BUN 16  6 - 23 mg/dL   Creatinine, Ser 9.52  0.50 - 1.10 mg/dL   Calcium 84.1  8.4 - 32.4 mg/dL   Total Protein 8.6 (*) 6.0 - 8.3 g/dL   Albumin 4.4  3.5 - 5.2 g/dL   AST 30  0 - 37 U/L   ALT 44 (*) 0 - 35 U/L   Alkaline Phosphatase 88  39 - 117 U/L   Total Bilirubin 0.3  0.3 - 1.2 mg/dL   GFR calc non Af Amer >90  >90 mL/min   GFR calc Af Amer >90  >90 mL/min  LIPASE, BLOOD      Result Value Range   Lipase 18  11 - 59 U/L  URINE MICROSCOPIC-ADD ON      Result Value Range   Squamous Epithelial / LPF FEW (*) RARE   RBC / HPF 3-6  <3 RBC/hpf   Bacteria, UA RARE  RARE   Urine-Other MUCOUS PRESENT    POCT PREGNANCY, URINE      Result Value Range   Preg Test, Ur NEGATIVE  NEGATIVE    Imaging Review Dg Chest Portable 1 View  10/25/2012   CLINICAL DATA:  Upper abdominal pain and vomiting.  EXAM: PORTABLE CHEST - 1 VIEW  COMPARISON:  CT chest 01/23/2008  FINDINGS: The heart size  and mediastinal contours are within normal limits. Both lungs are clear. The visualized skeletal structures are unremarkable.  IMPRESSION: No active disease.   Electronically Signed   By: Burman Nieves M.D.   On: 10/25/2012 02:10    EKG Interpretation   None       MDM  No diagnosis found. 1. Epigastric pain 2. N, V, D  DDx: viral GE vs cholecystitis. Symptoms controlled, afebrile. No RUQ tenderness on initial exam or on re-evaluation. Tolerating PO fluids. She reports feeling able to go home. Discussed 24 hour follow up with Dr. Tiburcio Pea.    Arnoldo Hooker, PA-C 10/25/12 [phone]

## 2012-10-25 NOTE — ED Provider Notes (Signed)
Medical screening examination/treatment/procedure(s) were performed by non-physician practitioner and as supervising physician I was immediately available for consultation/collaboration.  Sunnie Nielsen, MD 10/25/12 [phone]

## 2012-10-25 NOTE — Progress Notes (Signed)
300cc of emesis immediately following GI cocktail and fluid challenge.

## 2013-01-05 NOTE — L&D Delivery Note (Signed)
Delivery Note At 9:42 PM a non-viable female was delivered via Vaginal, Spontaneous Delivery in the toilet while the patient was attempting to void.  APGAR: 0, 0; weight .   Placenta status remains in situ  Called to see patient because when she went to the bathroom to urinate the fetus delivered in the toilet. The cord was clamped and cut and the fetus was extricated from the commode. The patient was then returned to her bed. Exam demonstrated the placenta remained within the uterus. The patient was experiencing minimal vaginal bleeding. Methergine 0.2 mg was administered intramuscularly and 800 mcg of Cytotec was placed vaginally. Will wait to see if the placenta will deliver spontaneously. Discussed with the patient that surgery involving a dilation and curettage may be required for placental delivery.  Anesthesia: None  Episiotomy: None Lacerations: None Suture Repair: None Est. Blood Loss (mL):    Tyla Burgner H. 09/06/2013, 11:47 PM

## 2013-07-02 ENCOUNTER — Encounter (HOSPITAL_COMMUNITY): Payer: Self-pay | Admitting: *Deleted

## 2013-07-02 ENCOUNTER — Inpatient Hospital Stay (HOSPITAL_COMMUNITY)
Admission: AD | Admit: 2013-07-02 | Discharge: 2013-07-03 | Disposition: A | Discharge status: Home / Self Care | Payer: BC Managed Care – PPO | Source: Ambulatory Visit | Attending: Obstetrics and Gynecology | Admitting: Obstetrics and Gynecology

## 2013-07-02 DIAGNOSIS — R1032 Left lower quadrant pain: Secondary | ICD-10-CM | POA: Insufficient documentation

## 2013-07-02 DIAGNOSIS — O219 Vomiting of pregnancy, unspecified: Secondary | ICD-10-CM

## 2013-07-02 DIAGNOSIS — O210 Mild hyperemesis gravidarum: Secondary | ICD-10-CM | POA: Insufficient documentation

## 2013-07-02 HISTORY — DX: Gastro-esophageal reflux disease without esophagitis: K21.9

## 2013-07-02 LAB — POCT PREGNANCY, URINE: Preg Test, Ur: POSITIVE — AB

## 2013-07-02 NOTE — MAU Note (Signed)
Pt reports she has been vomiting since Friday am, MD called in phenergan but it is not helping.

## 2013-07-03 ENCOUNTER — Inpatient Hospital Stay (HOSPITAL_COMMUNITY): Payer: BC Managed Care – PPO

## 2013-07-03 ENCOUNTER — Encounter (HOSPITAL_COMMUNITY): Payer: Self-pay | Admitting: *Deleted

## 2013-07-03 DIAGNOSIS — O219 Vomiting of pregnancy, unspecified: Secondary | ICD-10-CM

## 2013-07-03 LAB — COMPREHENSIVE METABOLIC PANEL
ALT: 24 U/L (ref 0–35)
AST: 26 U/L (ref 0–37)
Albumin: 4.3 g/dL (ref 3.5–5.2)
Alkaline Phosphatase: 74 U/L (ref 39–117)
BUN: 14 mg/dL (ref 6–23)
CO2: 29 mEq/L (ref 19–32)
Calcium: 10.5 mg/dL (ref 8.4–10.5)
Chloride: 90 mEq/L — ABNORMAL LOW (ref 96–112)
Creatinine, Ser: 0.61 mg/dL (ref 0.50–1.10)
GFR calc Af Amer: >90 mL/min (ref >90)
GFR calc non Af Amer: >90 mL/min (ref >90)
Glucose, Bld: 115 mg/dL — ABNORMAL HIGH (ref 70–99)
Potassium: 3.3 mEq/L — ABNORMAL LOW (ref 3.7–5.3)
Sodium: 135 mEq/L — ABNORMAL LOW (ref 137–147)
Total Bilirubin: 0.6 mg/dL (ref 0.3–1.2)
Total Protein: 7.8 g/dL (ref 6.0–8.3)

## 2013-07-03 LAB — URINALYSIS, ROUTINE W REFLEX MICROSCOPIC
Bilirubin Urine: NEGATIVE
Glucose, UA: NEGATIVE mg/dL
Ketones, ur: >80 mg/dL — AB
Leukocytes, UA: NEGATIVE
Nitrite: POSITIVE — AB
Protein, ur: 100 mg/dL — AB
Specific Gravity, Urine: >1.03 — ABNORMAL HIGH (ref 1.005–1.030)
Urobilinogen, UA: 0.2 mg/dL (ref 0.0–1.0)
pH: 6 (ref 5.0–8.0)

## 2013-07-03 LAB — URINE MICROSCOPIC-ADD ON

## 2013-07-03 LAB — CBC
HCT: 43.1 % (ref 36.0–46.0)
Hemoglobin: 15.6 g/dL — ABNORMAL HIGH (ref 12.0–15.0)
MCH: 32.4 pg (ref 26.0–34.0)
MCHC: 36.2 g/dL — ABNORMAL HIGH (ref 30.0–36.0)
MCV: 89.6 fL (ref 78.0–100.0)
Platelets: 274 10*3/uL (ref 150–400)
RBC: 4.81 MIL/uL (ref 3.87–5.11)
RDW: 13 % (ref 11.5–15.5)
WBC: 14.3 10*3/uL — ABNORMAL HIGH (ref 4.0–10.5)

## 2013-07-03 LAB — HCG, QUANTITATIVE, PREGNANCY: hCG, Beta Chain, Quant, S: 10364 m[IU]/mL — ABNORMAL HIGH (ref <5)

## 2013-07-03 MED ORDER — ONDANSETRON 8 MG PO TBDP: 8.0000 mg | ORAL_TABLET | Freq: Three times a day (TID) | ORAL | Status: DC | PRN

## 2013-07-03 MED ORDER — M.V.I. ADULT IV INJ
Freq: Once | INTRAVENOUS | Status: AC
  Administered 2013-07-03: 02:00:00 via INTRAVENOUS
  Filled 2013-07-03: qty 10

## 2013-07-03 MED ORDER — POTASSIUM CHLORIDE CRYS ER 20 MEQ PO TBCR: 20.0000 meq | EXTENDED_RELEASE_TABLET | Freq: Every day | ORAL | Status: DC

## 2013-07-03 MED ORDER — ONDANSETRON 8 MG PO TBDP
8.0000 mg | ORAL_TABLET | Freq: Once | ORAL | Status: AC
  Administered 2013-07-03: 8 mg via ORAL
  Filled 2013-07-03: qty 1

## 2013-07-03 MED ORDER — POTASSIUM CHLORIDE CRYS ER 20 MEQ PO TBCR
30.0000 meq | EXTENDED_RELEASE_TABLET | Freq: Once | ORAL | Status: AC
  Administered 2013-07-03: 30 meq via ORAL
  Filled 2013-07-03: qty 1

## 2013-07-03 MED ORDER — PROMETHAZINE HCL 25 MG/ML IJ SOLN
25.0000 mg | Freq: Once | INTRAVENOUS | Status: AC
  Administered 2013-07-03: 25 mg via INTRAVENOUS
  Filled 2013-07-03: qty 1

## 2013-07-03 NOTE — Discharge Instructions (Signed)

## 2013-07-03 NOTE — MAU Provider Note (Signed)
History     CSN: [id number]  Arrival date and time: 07/02/13 2317   First Provider Initiated Contact with Patient 07/03/13 0019      Chief Complaint  Patient presents with  . Hyperemesis Gravidarum   HPI  Bonnie Shepard is a 28 y.o. G1P0 at ~5 weeks who presents today with nausea and vomiting. She states that since Friday she has been vomiting 10-15 times per day. She states that she has an RX for phenergan at home, but isn't helping. She states that she after about 2 hours she is vomiting again. She reports a burning sensation in the upper part of her abdomen as well. She has also been having muscle cramps in her hands. She has had some pain with urination, and LLQ pain.   Past Medical History  Diagnosis Date  . Acid reflux     Past Surgical History  Procedure Laterality Date  . Tonsillectomy      Family History  Problem Relation Age of Onset  . Hypertension Other   . Cancer Other     History  Substance Use Topics  . Smoking status: Current Some Day Smoker    Types: Cigarettes  . Smokeless tobacco: Not on file  . Alcohol Use: No     Comment: occasionally    Allergies: No Known Allergies  Prescriptions prior to admission  Medication Sig Dispense Refill  . omeprazole (PRILOSEC) 10 MG capsule Take 10 mg by mouth daily.      . Prenatal Vit-Fe Fumarate-FA (MULTIVITAMIN-PRENATAL) 27-0.8 MG TABS tablet Take 1 tablet by mouth daily at 12 noon.      . promethazine (PHENERGAN) 25 MG tablet Take 25 mg by mouth every 6 (six) hours as needed for nausea or vomiting.        ROS Physical Exam   Blood pressure 147/83, pulse 58, temperature 98.4 F (36.9 C), temperature source Oral, resp. rate 18, height 5\' 3"  (1.6 m), weight 80.74 kg (178 lb), last menstrual period 04/07/2013, SpO2 100.00%.  Physical Exam  MAU Course  Procedures  Results for orders placed during the hospital encounter of 07/02/13 (from the past 24 hour(s))  URINALYSIS, ROUTINE W REFLEX MICROSCOPIC      Status: Abnormal   Collection Time    07/02/13 11:44 PM      Result Value Ref Range   Color, Urine YELLOW  YELLOW   APPearance HAZY (*) CLEAR   Specific Gravity, Urine >1.030 (*) 1.005 - 1.030   pH 6.0  5.0 - 8.0   Glucose, UA NEGATIVE  NEGATIVE mg/dL   Hgb urine dipstick MODERATE (*) NEGATIVE   Bilirubin Urine NEGATIVE  NEGATIVE   Ketones, ur >80 (*) NEGATIVE mg/dL   Protein, ur 409 (*) NEGATIVE mg/dL   Urobilinogen, UA 0.2  0.0 - 1.0 mg/dL   Nitrite POSITIVE (*) NEGATIVE   Leukocytes, UA NEGATIVE  NEGATIVE  URINE MICROSCOPIC-ADD ON     Status: Abnormal   Collection Time    07/02/13 11:44 PM      Result Value Ref Range   Squamous Epithelial / LPF MANY (*) RARE   WBC, UA 0-2  <3 WBC/hpf   RBC / HPF 3-6  <3 RBC/hpf   Bacteria, UA MANY (*) RARE   Urine-Other MUCOUS PRESENT    POCT PREGNANCY, URINE     Status: Abnormal   Collection Time    07/02/13 11:53 PM      Result Value Ref Range   Preg Test, Ur POSITIVE (*) NEGATIVE  CBC     Status: Abnormal   Collection Time    07/03/13 12:35 AM      Result Value Ref Range   WBC 14.3 (*) 4.0 - 10.5 K/uL   RBC 4.81  3.87 - 5.11 MIL/uL   Hemoglobin 15.6 (*) 12.0 - 15.0 g/dL   HCT 16.143.1  09.636.0 - 04.546.0 %   MCV 89.6  78.0 - 100.0 fL   MCH 32.4  26.0 - 34.0 pg   MCHC 36.2 (*) 30.0 - 36.0 g/dL   RDW 40.913.0  81.111.5 - 91.415.5 %   Platelets 274  150 - 400 K/uL  COMPREHENSIVE METABOLIC PANEL     Status: Abnormal   Collection Time    07/03/13 12:35 AM      Result Value Ref Range   Sodium 135 (*) 137 - 147 mEq/L   Potassium 3.3 (*) 3.7 - 5.3 mEq/L   Chloride 90 (*) 96 - 112 mEq/L   CO2 29  19 - 32 mEq/L   Glucose, Bld 115 (*) 70 - 99 mg/dL   BUN 14  6 - 23 mg/dL   Creatinine, Ser 7.820.61  0.50 - 1.10 mg/dL   Calcium 95.610.5  8.4 - 21.310.5 mg/dL   Total Protein 7.8  6.0 - 8.3 g/dL   Albumin 4.3  3.5 - 5.2 g/dL   AST 26  0 - 37 U/L   ALT 24  0 - 35 U/L   Alkaline Phosphatase 74  39 - 117 U/L   Total Bilirubin 0.6  0.3 - 1.2 mg/dL   GFR calc non Af Amer  >90  >90 mL/min   GFR calc Af Amer >90  >90 mL/min  HCG, QUANTITATIVE, PREGNANCY     Status: Abnormal   Collection Time    07/03/13 12:35 AM      Result Value Ref Range   hCG, Beta Chain, Quant, S 10364 (*) <5 mIU/mL   [place] Ob Comp Less 14 Wks  07/03/2013   CLINICAL DATA:  Early pregnancy with left lower quadrant pain  EXAM: OBSTETRIC <14 WK US AND TRANSVAGINAL OB US  TECHNIQUE: Both transabdominal and transvaginal ultrasound examinations were performed for complete evaluation of the gestation as well as the maternal uterus, adnexal regions, and pelvic cul-de-sac. Transvaginal technique was performed to assess early pregnancy.  COMPARISON:  None.  FINDINGS: Intrauterine gestational sac: Visualized/normal in shape.  Yolk sac:  Present  Embryo:  Present  Cardiac Activity: Present  Heart Rate:  104 bpm  CRL:   3.6  mm   6 w 1 d                  US EDC: 02/25/2014  Maternal uterus/adnexae: No subchorionic hemorrhage. The ovaries are symmetric and normal in appearance. No adnexal mass. No free pelvic fluid.  IMPRESSION: Single, living intrauterine gestation - sonographic age 66 weeks 1 day. No findings to explain left lower quadrant pain.   Electronically Signed   By: Tiburcio PeaJonathan  Watts M.D.   On: 07/03/2013 03:07   [place] Ob Transvaginal  07/03/2013   CLINICAL DATA:  Early pregnancy with left lower quadrant pain  EXAM: OBSTETRIC <14 WK US AND TRANSVAGINAL OB US  TECHNIQUE: Both transabdominal and transvaginal ultrasound examinations were performed for complete evaluation of the gestation as well as the maternal uterus, adnexal regions, and pelvic cul-de-sac. Transvaginal technique was performed to assess early pregnancy.  COMPARISON:  None.  FINDINGS: Intrauterine gestational sac: Visualized/normal in shape.  Yolk sac:  Present  Embryo:  Present  Cardiac Activity: Present  Heart Rate:  104 bpm  CRL:   3.6  mm   6 w 1 d                  US EDC: 02/25/2014  Maternal uterus/adnexae: No subchorionic hemorrhage. The  ovaries are symmetric and normal in appearance. No adnexal mass. No free pelvic fluid.  IMPRESSION: Single, living intrauterine gestation - sonographic age 25 weeks 1 day. No findings to explain left lower quadrant pain.   Electronically Signed   By: Tiburcio PeaJonathan  Watts M.D.   On: 07/03/2013 03:07    0243: 1L D5LR with 25mg  phenergan           1L D5LR with multivitamins  Patient has not vomited, and is tolerating ice chips.  0250: Patient vomited once 0311: 8 mg Zofran ODT and will attempt PO K-DUR 0415: Patient was able to keep down PO K-DUR  Assessment and Plan   1. Nausea/vomiting in pregnancy    First trimester precautions reviewed Return to MAU as needed RX: zofran 8mg  ODT 30 meq KDUR #4  Follow-up Information   [facility] (As scheduled)    Contact information:   [address] [place] [address] [phone]       Tawnya CrookHogan, Heather Donovan 07/03/2013, 12:21 AM

## 2013-07-06 LAB — URINE CULTURE: Colony Count: >=100000

## 2013-07-26 ENCOUNTER — Encounter (HOSPITAL_COMMUNITY): Payer: Self-pay | Admitting: *Deleted

## 2013-07-26 ENCOUNTER — Inpatient Hospital Stay (HOSPITAL_COMMUNITY): Payer: BC Managed Care – PPO

## 2013-07-26 ENCOUNTER — Inpatient Hospital Stay (HOSPITAL_COMMUNITY)
Admission: AD | Admit: 2013-07-26 | Discharge: 2013-07-26 | Disposition: A | Discharge status: Home / Self Care | Payer: BC Managed Care – PPO | Source: Ambulatory Visit | Attending: Obstetrics & Gynecology | Admitting: Obstetrics & Gynecology

## 2013-07-26 DIAGNOSIS — O210 Mild hyperemesis gravidarum: Secondary | ICD-10-CM | POA: Insufficient documentation

## 2013-07-26 DIAGNOSIS — O209 Hemorrhage in early pregnancy, unspecified: Secondary | ICD-10-CM | POA: Insufficient documentation

## 2013-07-26 DIAGNOSIS — O219 Vomiting of pregnancy, unspecified: Secondary | ICD-10-CM

## 2013-07-26 LAB — WET PREP, GENITAL
Clue Cells Wet Prep HPF POC: NONE SEEN
Trich, Wet Prep: NONE SEEN
Yeast Wet Prep HPF POC: NONE SEEN

## 2013-07-26 LAB — URINALYSIS, ROUTINE W REFLEX MICROSCOPIC
Bilirubin Urine: NEGATIVE
Glucose, UA: NEGATIVE mg/dL
Ketones, ur: >80 mg/dL — AB
Leukocytes, UA: NEGATIVE
Nitrite: NEGATIVE
Protein, ur: 100 mg/dL — AB
Specific Gravity, Urine: 1.01 (ref 1.005–1.030)
Urobilinogen, UA: 0.2 mg/dL (ref 0.0–1.0)
pH: 7.5 (ref 5.0–8.0)

## 2013-07-26 LAB — URINE MICROSCOPIC-ADD ON

## 2013-07-26 MED ORDER — METOCLOPRAMIDE HCL 10 MG PO TABS: 10.0000 mg | ORAL_TABLET | Freq: Four times a day (QID) | ORAL | Status: DC

## 2013-07-26 MED ORDER — METOCLOPRAMIDE HCL 5 MG/ML IJ SOLN
10.0000 mg | Freq: Once | INTRAMUSCULAR | Status: AC
  Administered 2013-07-26: 10 mg via INTRAVENOUS
  Filled 2013-07-26: qty 2

## 2013-07-26 MED ORDER — LACTATED RINGERS IV BOLUS (SEPSIS)
1000.0000 mL | Freq: Once | INTRAVENOUS | Status: AC
  Administered 2013-07-26: 1000 mL via INTRAVENOUS

## 2013-07-26 MED ORDER — DEXTROSE IN LACTATED RINGERS 5 % IV SOLN: INTRAVENOUS | Status: DC

## 2013-07-26 NOTE — MAU Note (Signed)
Patient states she has been having nausea and vomiting for about 6 weeks. States about 1100  had a gush of dark red vaginal bleeding, no pain

## 2013-07-26 NOTE — Discharge Instructions (Signed)
Vaginal Bleeding During Pregnancy, First Trimester  A small amount of bleeding (spotting) from the vagina is relatively common in early pregnancy. It usually stops on its own. Various things may cause bleeding or spotting in early pregnancy. Some bleeding may be related to the pregnancy, and some may not. In most cases, the bleeding is normal and is not a problem. However, bleeding can also be a sign of something serious. Be sure to tell your health care provider about any vaginal bleeding right away.  Some possible causes of vaginal bleeding during the first trimester include:  · Infection or inflammation of the cervix.  · Growths (polyps) on the cervix.  · Miscarriage or threatened miscarriage.  · Pregnancy tissue has developed outside of the uterus and in a fallopian tube (tubal pregnancy).  · Tiny cysts have developed in the uterus instead of pregnancy tissue (molar pregnancy).  HOME CARE INSTRUCTIONS   Watch your condition for any changes. The following actions may help to lessen any discomfort you are feeling:  · Follow your health care provider's instructions for limiting your activity. If your health care provider orders bed rest, you may need to stay in bed and only get up to use the bathroom. However, your health care provider may allow you to continue light activity.  · If needed, make plans for someone to help with your regular activities and responsibilities while you are on bed rest.  · Keep track of the number of pads you use each day, how often you change pads, and how soaked (saturated) they are. Write this down.  · Do not use tampons. Do not douche.  · Do not have sexual intercourse or orgasms until approved by your health care provider.  · If you pass any tissue from your vagina, save the tissue so you can show it to your health care provider.  · Only take over-the-counter or prescription medicines as directed by your health care provider.  · Do not take aspirin because it can make you  bleed.  · Keep all follow-up appointments as directed by your health care provider.  SEEK MEDICAL CARE IF:  · You have any vaginal bleeding during any part of your pregnancy.  · You have cramps or labor pains.  · You have a fever, not controlled by medicine.  SEEK IMMEDIATE MEDICAL CARE IF:   · You have severe cramps in your back or belly (abdomen).  · You pass large clots or tissue from your vagina.  · Your bleeding increases.  · You feel light-headed or weak, or you have fainting episodes.  · You have chills.  · You are leaking fluid or have a gush of fluid from your vagina.  · You pass out while having a bowel movement.  MAKE SURE YOU:  · Understand these instructions.  · Will watch your condition.  · Will get help right away if you are not doing well or get worse.  Document Released: 10/01/2004 Document Revised: 12/27/2012 Document Reviewed: 08/29/2012  ExitCare® Patient Information ©2015 [employer]. This information is not intended to replace advice given to you by your health care provider. Make sure you discuss any questions you have with your health care provider.

## 2013-07-27 LAB — CULTURE, OB URINE: Colony Count: 30000

## 2013-07-27 LAB — GC/CHLAMYDIA PROBE AMP
CT Probe RNA: NEGATIVE
GC Probe RNA: NEGATIVE

## 2013-08-17 ENCOUNTER — Encounter (HOSPITAL_COMMUNITY): Payer: Self-pay | Admitting: *Deleted

## 2013-08-17 ENCOUNTER — Inpatient Hospital Stay (HOSPITAL_COMMUNITY)
Admission: AD | Admit: 2013-08-17 | Discharge: 2013-08-17 | Disposition: A | Discharge status: Home / Self Care | Payer: BC Managed Care – PPO | Source: Ambulatory Visit | Attending: Obstetrics and Gynecology | Admitting: Obstetrics and Gynecology

## 2013-08-17 DIAGNOSIS — O219 Vomiting of pregnancy, unspecified: Secondary | ICD-10-CM

## 2013-08-17 DIAGNOSIS — O210 Mild hyperemesis gravidarum: Secondary | ICD-10-CM | POA: Insufficient documentation

## 2013-08-17 DIAGNOSIS — O99330 Smoking (tobacco) complicating pregnancy, unspecified trimester: Secondary | ICD-10-CM | POA: Diagnosis not present

## 2013-08-17 HISTORY — DX: Unspecified infectious disease: B99.9

## 2013-08-17 HISTORY — DX: Unspecified abnormal cytological findings in specimens from vagina: R87.629

## 2013-08-17 HISTORY — DX: Unspecified asthma, uncomplicated: J45.909

## 2013-08-17 LAB — URINALYSIS, ROUTINE W REFLEX MICROSCOPIC
Bilirubin Urine: NEGATIVE
Glucose, UA: NEGATIVE mg/dL
Ketones, ur: 40 mg/dL — AB
Leukocytes, UA: NEGATIVE
Nitrite: NEGATIVE
Protein, ur: NEGATIVE mg/dL
Specific Gravity, Urine: 1.02 (ref 1.005–1.030)
Urobilinogen, UA: 0.2 mg/dL (ref 0.0–1.0)
pH: 6.5 (ref 5.0–8.0)

## 2013-08-17 LAB — URINE MICROSCOPIC-ADD ON

## 2013-08-17 MED ORDER — METOCLOPRAMIDE HCL 10 MG PO TABS: 10.0000 mg | ORAL_TABLET | Freq: Three times a day (TID) | ORAL | Status: DC

## 2013-08-17 MED ORDER — METOCLOPRAMIDE HCL 5 MG/ML IJ SOLN
10.0000 mg | Freq: Once | INTRAMUSCULAR | Status: AC
  Administered 2013-08-17: 10 mg via INTRAVENOUS
  Filled 2013-08-17: qty 2

## 2013-08-17 MED ORDER — DEXTROSE 5 % IN LACTATED RINGERS IV BOLUS
1000.0000 mL | Freq: Once | INTRAVENOUS | Status: AC
  Administered 2013-08-17: 1000 mL via INTRAVENOUS

## 2013-08-17 NOTE — MAU Provider Note (Signed)
Chief Complaint: Emesis During Pregnancy   First Provider Initiated Contact with Patient 08/17/13 1139     SUBJECTIVE HPI: Bonnie Shepard is a 28 y.o. G1P0 at [redacted]w[redacted]d by LMP who presents with exacerbation of N/V of pregnancy. Phenergan not helping. Keeping down some sips of water. Denies fever, chills, sick contact, diarrhea, constipation, abd pain or VB.   Past Medical History  Diagnosis Date  . Acid reflux   . Asthma   . Infection     freq UTI  . Vaginal Pap smear, abnormal     f/u wnl   OB History  Gravida Para Term Preterm AB SAB TAB Ectopic Multiple Living  1             # Outcome Date GA Lbr Len/2nd Weight Sex Delivery Anes PTL Lv  1 CUR              Past Surgical History  Procedure Laterality Date  . Tonsillectomy     History   Social History  . Marital Status: Single    Spouse Name: N/A    Number of Children: N/A  . Years of Education: N/A   Occupational History  . Not on file.   Social History Main Topics  . Smoking status: Current Some Day Smoker -- 7 years    Types: Cigarettes  . Smokeless tobacco: Never Used  . Alcohol Use: No     Comment: occasionally  . Drug Use: No  . Sexual Activity: Yes    Birth Control/ Protection: Condom   Other Topics Concern  . Not on file   Social History Narrative  . No narrative on file   No current facility-administered medications on file prior to encounter.   Current Outpatient Prescriptions on File Prior to Encounter  Medication Sig Dispense Refill  . promethazine (PHENERGAN) 25 MG tablet Take 25 mg by mouth every 6 (six) hours as needed for nausea or vomiting.       No Known Allergies  ROS: Pertinent items in HPI  OBJECTIVE Blood pressure 133/77, pulse 81, temperature 98 F (36.7 C), temperature source Oral, resp. rate 18, height 5\' 2"  (1.575 m), weight 81.194 kg (179 lb), last menstrual period 04/07/2013. GENERAL: Well-developed, well-nourished female in mild distress. Tearful, spitting frequently. Vomiting  once witnessed by staff.  HEENT: Normocephalic. Mucus membranes moist.  HEART: normal rate RESP: normal effort ABDOMEN: Soft, non-tender NEURO: Alert and oriented FHR 141 by doppler.  LAB RESULTS Results for orders placed during the hospital encounter of 08/17/13 (from the past 24 hour(s))  URINALYSIS, ROUTINE W REFLEX MICROSCOPIC     Status: Abnormal   Collection Time    08/17/13 10:40 AM      Result Value Ref Range   Color, Urine YELLOW  YELLOW   APPearance CLEAR  CLEAR   Specific Gravity, Urine 1.020  1.005 - 1.030   pH 6.5  5.0 - 8.0   Glucose, UA NEGATIVE  NEGATIVE mg/dL   Hgb urine dipstick SMALL (*) NEGATIVE   Bilirubin Urine NEGATIVE  NEGATIVE   Ketones, ur 40 (*) NEGATIVE mg/dL   Protein, ur NEGATIVE  NEGATIVE mg/dL   Urobilinogen, UA 0.2  0.0 - 1.0 mg/dL   Nitrite NEGATIVE  NEGATIVE   Leukocytes, UA NEGATIVE  NEGATIVE  URINE MICROSCOPIC-ADD ON     Status: None   Collection Time    08/17/13 10:40 AM      Result Value Ref Range   Squamous Epithelial / LPF RARE  RARE  WBC, UA 3-6  <3 WBC/hpf   Bacteria, UA RARE  RARE    IMAGING NA  MAU COURSE Regan. IV bolus.  Felling much better and tolerating PO's.  ASSESSMENT 1. Nausea and vomiting of pregnancy, antepartum     PLAN Discharge home in stable; condition per consult w/ Dr. Henderson CloudHorvath. Rec taking Reglan on schedule for next week.      Follow-up Information   Follow up with [facility] (As scheduled or , As needed if symptoms worsen)    Contact information:   [address] [place] [address] [phone]      [facility] with [facility] (As needed in emergencies)    Contact information:   [address] [id number] [place] [address] [phone]       Medication List         metoCLOPramide 10 MG tablet  Commonly known as:  REGLAN  Take 1 tablet (10 mg total) by mouth 3 (three)  times daily with meals.     ondansetron 8 MG disintegrating tablet  Commonly known as:  ZOFRAN-ODT  Take 8 mg by mouth every 8 (eight) hours as needed for nausea or vomiting.     pantoprazole 40 MG tablet  Commonly known as:  PROTONIX  Take 40 mg by mouth daily as needed (for acid reflux.).     prenatal multivitamin Tabs tablet  Take 1 tablet by mouth daily at 12 noon.     promethazine 25 MG tablet  Commonly known as:  PHENERGAN  Take 25 mg by mouth every 6 (six) hours as needed for nausea or vomiting.         [place], CNM 08/17/2013  1:24 PM

## 2013-08-17 NOTE — MAU Note (Signed)
Ongoing/worsening problem with vomiting.  "didn't wait as long this time"

## 2013-08-17 NOTE — Discharge Instructions (Signed)
Morning Sickness °Morning sickness is when you feel sick to your stomach (nauseous) during pregnancy. This nauseous feeling may or may not come with vomiting. It often occurs in the morning but can be a problem any time of day. Morning sickness is most common during the first trimester, but it may continue throughout pregnancy. While morning sickness is unpleasant, it is usually harmless unless you develop severe and continual vomiting (hyperemesis gravidarum). This condition requires more intense treatment.  °CAUSES  °The cause of morning sickness is not completely known but seems to be related to normal hormonal changes that occur in pregnancy. °RISK FACTORS °You are at greater risk if you: °· Experienced nausea or vomiting before your pregnancy. °· Had morning sickness during a previous pregnancy. °· Are pregnant with more than one baby, such as twins. °TREATMENT  °Do not use any medicines (prescription, over-the-counter, or herbal) for morning sickness without first talking to your health care provider. Your health care provider may prescribe or recommend: °· Vitamin B6 supplements. °· Anti-nausea medicines. °· The herbal medicine ginger. °HOME CARE INSTRUCTIONS  °· Only take over-the-counter or prescription medicines as directed by your health care provider. °· Taking multivitamins before getting pregnant can prevent or decrease the severity of morning sickness in most women. °· Eat a piece of dry toast or unsalted crackers before getting out of bed in the morning. °· Eat five or six small meals a day. °· Eat dry and bland foods (rice, baked potato). Foods high in carbohydrates are often helpful. °· Do not drink liquids with your meals. Drink liquids between meals. °· Avoid greasy, fatty, and spicy foods. °· Get someone to cook for you if the smell of any food causes nausea and vomiting. °· If you feel nauseous after taking prenatal vitamins, take the vitamins at night or with a snack.  °· Snack on protein  foods (nuts, yogurt, cheese) between meals if you are hungry. °· Eat unsweetened gelatins for desserts. °· Wearing an acupressure wristband (worn for sea sickness) may be helpful. °· Acupuncture may be helpful. °· Do not smoke. °· Get a humidifier to keep the air in your house free of odors. °· Get plenty of fresh air. °SEEK MEDICAL CARE IF:  °· Your home remedies are not working, and you need medicine. °· You feel dizzy or lightheaded. °· You are losing weight. °SEEK IMMEDIATE MEDICAL CARE IF:  °· You have persistent and uncontrolled nausea and vomiting. °· You pass out (faint). °MAKE SURE YOU: °· Understand these instructions. °· Will watch your condition. °· Will get help right away if you are not doing well or get worse. °Document Released: 02/12/2006 Document Revised: 12/27/2012 Document Reviewed: 06/08/2012 °ExitCare® Patient Information ©2015 [employer]. This information is not intended to replace advice given to you by your health care provider. Make sure you discuss any questions you have with your health care provider. ° ° °Eating Plan for Hyperemesis Gravidarum °Severe cases of hyperemesis gravidarum can lead to dehydration and malnutrition. The hyperemesis eating plan is one way to lessen the symptoms of nausea and vomiting. It is often used with prescribed medicines to control your symptoms.  °WHAT CAN I DO TO RELIEVE MY SYMPTOMS? °Listen to your body. Everyone is different and has different preferences. Find what works best for you. Some of the following things may help: °· Eat and drink slowly. °· Eat 5-6 small meals daily instead of 3 large meals.   °· Eat crackers before you get out of bed in the   morning.   °· Starchy foods are usually well tolerated (such as cereal, toast, bread, potatoes, pasta, rice, and pretzels).   °· Ginger may help with nausea. Add ¼ tsp ground ginger to hot tea or choose ginger tea.   °· Try drinking 100% fruit juice or an electrolyte drink. °· Continue to take your  prenatal vitamins as directed by your health care provider. If you are having trouble taking your prenatal vitamins, talk with your health care provider about different options. °· Include at least 1 serving of protein with your meals and snacks (such as meats or poultry, beans, nuts, eggs, or yogurt). Try eating a protein-rich snack before bed (such as cheese and crackers or a half turkey or peanut butter sandwich). °WHAT THINGS SHOULD I AVOID TO REDUCE MY SYMPTOMS? °The following things may help reduce your symptoms: °· Avoid foods with strong smells. Try eating meals in well-ventilated areas that are free of odors. °· Avoid drinking water or other beverages with meals. Try not to drink anything less than 30 minutes before and after meals. °· Avoid drinking more than 1 cup of fluid at a time. °· Avoid fried or high-fat foods, such as butter and cream sauces. °· Avoid spicy foods. °· Avoid skipping meals the best you can. Nausea can be more intense on an empty stomach. If you cannot tolerate food at that time, do not force it. Try sucking on ice chips or other frozen items and make up the calories later. °· Avoid lying down within 2 hours after eating. °Document Released: 10/19/2006 Document Revised: 12/27/2012 Document Reviewed: 10/26/2012 °ExitCare® Patient Information ©2015 [employer]. This information is not intended to replace advice given to you by your health care provider. Make sure you discuss any questions you have with your health care provider. ° ° °

## 2013-09-06 ENCOUNTER — Inpatient Hospital Stay (HOSPITAL_COMMUNITY)
Admission: AD | Admit: 2013-09-06 | Discharge: 2013-09-08 | DRG: 770 | Disposition: A | Discharge status: Home / Self Care | Payer: BC Managed Care – PPO | Source: Ambulatory Visit | Attending: Obstetrics and Gynecology | Admitting: Obstetrics and Gynecology

## 2013-09-06 ENCOUNTER — Inpatient Hospital Stay (HOSPITAL_COMMUNITY): Payer: BC Managed Care – PPO

## 2013-09-06 ENCOUNTER — Encounter (HOSPITAL_COMMUNITY): Payer: Self-pay | Admitting: Pediatrics

## 2013-09-06 DIAGNOSIS — O209 Hemorrhage in early pregnancy, unspecified: Secondary | ICD-10-CM | POA: Diagnosis present

## 2013-09-06 DIAGNOSIS — J45909 Unspecified asthma, uncomplicated: Secondary | ICD-10-CM | POA: Diagnosis present

## 2013-09-06 DIAGNOSIS — O021 Missed abortion: Secondary | ICD-10-CM | POA: Diagnosis present

## 2013-09-06 DIAGNOSIS — F17200 Nicotine dependence, unspecified, uncomplicated: Secondary | ICD-10-CM | POA: Diagnosis present

## 2013-09-06 DIAGNOSIS — K219 Gastro-esophageal reflux disease without esophagitis: Secondary | ICD-10-CM | POA: Diagnosis present

## 2013-09-06 DIAGNOSIS — Z8249 Family history of ischemic heart disease and other diseases of the circulatory system: Secondary | ICD-10-CM

## 2013-09-06 DIAGNOSIS — IMO0002 Reserved for concepts with insufficient information to code with codable children: Secondary | ICD-10-CM

## 2013-09-06 DIAGNOSIS — IMO0001 Reserved for inherently not codable concepts without codable children: Secondary | ICD-10-CM

## 2013-09-06 LAB — TYPE AND SCREEN
ABO/RH(D): O POS
Antibody Screen: NEGATIVE

## 2013-09-06 LAB — CBC WITH DIFFERENTIAL/PLATELET
Basophils Absolute: 0 10*3/uL (ref 0.0–0.1)
Basophils Relative: 0 % (ref 0–1)
Eosinophils Absolute: 0 10*3/uL (ref 0.0–0.7)
Eosinophils Relative: 0 % (ref 0–5)
HCT: 37.5 % (ref 36.0–46.0)
Hemoglobin: 13.7 g/dL (ref 12.0–15.0)
Lymphocytes Relative: 4 % — ABNORMAL LOW (ref 12–46)
Lymphs Abs: 1.6 10*3/uL (ref 0.7–4.0)
MCH: 32.9 pg (ref 26.0–34.0)
MCHC: 36.5 g/dL — ABNORMAL HIGH (ref 30.0–36.0)
MCV: 89.9 fL (ref 78.0–100.0)
Monocytes Absolute: 1.6 10*3/uL — ABNORMAL HIGH (ref 0.1–1.0)
Monocytes Relative: 4 % (ref 3–12)
Neutro Abs: 38 10*3/uL — ABNORMAL HIGH (ref 1.7–7.7)
Neutrophils Relative %: 92 % — ABNORMAL HIGH (ref 43–77)
Platelets: 229 10*3/uL (ref 150–400)
RBC: 4.17 MIL/uL (ref 3.87–5.11)
RDW: 12.6 % (ref 11.5–15.5)
WBC Morphology: <5
WBC: 41.2 10*3/uL — ABNORMAL HIGH (ref 4.0–10.5)

## 2013-09-06 LAB — ABO/RH: ABO/RH(D): O POS

## 2013-09-06 MED ORDER — PROMETHAZINE HCL 25 MG/ML IJ SOLN
25.0000 mg | Freq: Once | INTRAMUSCULAR | Status: AC
  Administered 2013-09-06: 25 mg via INTRAVENOUS
  Filled 2013-09-06: qty 1

## 2013-09-06 MED ORDER — HYDROMORPHONE HCL PF 1 MG/ML IJ SOLN
1.0000 mg | INTRAMUSCULAR | Status: DC | PRN
  Administered 2013-09-07: 1 mg via INTRAVENOUS
  Filled 2013-09-06: qty 1

## 2013-09-06 MED ORDER — MISOPROSTOL 200 MCG PO TABS
800.0000 ug | ORAL_TABLET | Freq: Once | ORAL | Status: AC
  Administered 2013-09-06: 800 ug via VAGINAL

## 2013-09-06 MED ORDER — FAMOTIDINE IN NACL 20-0.9 MG/50ML-% IV SOLN
20.0000 mg | Freq: Once | INTRAVENOUS | Status: AC
  Administered 2013-09-06: 20 mg via INTRAVENOUS
  Filled 2013-09-06: qty 50

## 2013-09-06 MED ORDER — PHENYLEPHRINE 40 MCG/ML (10ML) SYRINGE FOR IV PUSH (FOR BLOOD PRESSURE SUPPORT): 80.0000 ug | PREFILLED_SYRINGE | INTRAVENOUS | Status: DC | PRN

## 2013-09-06 MED ORDER — FENTANYL 2.5 MCG/ML BUPIVACAINE 1/10 % EPIDURAL INFUSION (WH - ANES): 14.0000 mL/h | INTRAMUSCULAR | Status: DC | PRN

## 2013-09-06 MED ORDER — DIPHENHYDRAMINE HCL 50 MG/ML IJ SOLN
12.5000 mg | INTRAMUSCULAR | Status: DC | PRN
Start: 2013-09-06 — End: 2013-09-07

## 2013-09-06 MED ORDER — HYDROMORPHONE HCL PF 1 MG/ML IJ SOLN
1.0000 mg | INTRAMUSCULAR | Status: AC
  Administered 2013-09-06: 1 mg via INTRAVENOUS
  Filled 2013-09-06: qty 1

## 2013-09-06 MED ORDER — LACTATED RINGERS IV SOLN: 500.0000 mL | Freq: Once | INTRAVENOUS | Status: DC

## 2013-09-06 MED ORDER — EPHEDRINE 5 MG/ML INJ: 10.0000 mg | INTRAVENOUS | Status: DC | PRN

## 2013-09-06 MED ORDER — METHYLERGONOVINE MALEATE 0.2 MG/ML IJ SOLN
INTRAMUSCULAR | Status: AC
  Administered 2013-09-06: 0.2 mg
  Filled 2013-09-06: qty 1

## 2013-09-06 MED ORDER — AMPICILLIN SODIUM 2 G IJ SOLR
1.0000 g | Freq: Four times a day (QID) | INTRAMUSCULAR | Status: DC
  Filled 2013-09-06 (×3): qty 1000

## 2013-09-06 MED ORDER — AMPICILLIN SODIUM 2 G IJ SOLR
2.0000 g | Freq: Once | INTRAMUSCULAR | Status: DC
  Filled 2013-09-06: qty 2000

## 2013-09-06 MED ORDER — SODIUM CHLORIDE 0.9 % IV SOLN
1.0000 g | Freq: Four times a day (QID) | INTRAVENOUS | Status: DC
  Administered 2013-09-07: 1 g via INTRAVENOUS
  Filled 2013-09-06 (×4): qty 1000

## 2013-09-06 MED ORDER — SODIUM CHLORIDE 0.9 % IV SOLN
2.0000 g | Freq: Once | INTRAVENOUS | Status: AC
  Administered 2013-09-06: 2 g via INTRAVENOUS
  Filled 2013-09-06: qty 2000

## 2013-09-06 MED ORDER — LACTATED RINGERS IV BOLUS (SEPSIS)
125.0000 mL | Freq: Once | INTRAVENOUS | Status: AC
  Administered 2013-09-06: 125 mL via INTRAVENOUS
  Administered 2013-09-06: 500 mL via INTRAVENOUS
  Administered 2013-09-06: 1000 mL via INTRAVENOUS

## 2013-09-06 MED ORDER — OXYTOCIN 40 UNITS IN LACTATED RINGERS INFUSION - SIMPLE MED
INTRAVENOUS | Status: AC
  Filled 2013-09-06: qty 1000

## 2013-09-06 MED ORDER — LACTATED RINGERS IV BOLUS (SEPSIS): 1000.0000 mL | Freq: Once | INTRAVENOUS | Status: DC

## 2013-09-06 MED ORDER — GENTAMICIN SULFATE 40 MG/ML IJ SOLN
130.0000 mg | Freq: Three times a day (TID) | INTRAVENOUS | Status: DC
  Administered 2013-09-06 – 2013-09-07 (×2): 130 mg via INTRAVENOUS
  Filled 2013-09-06 (×3): qty 3.25

## 2013-09-06 MED ORDER — METHYLERGONOVINE MALEATE 0.2 MG PO TABS: 0.2000 mg | ORAL_TABLET | Freq: Once | ORAL | Status: DC

## 2013-09-06 MED ORDER — MISOPROSTOL 200 MCG PO TABS
ORAL_TABLET | ORAL | Status: AC
  Filled 2013-09-06: qty 4

## 2013-09-06 NOTE — Progress Notes (Signed)
Bedside ultrasound ordered questionable fetal parts in vagina.

## 2013-09-06 NOTE — Plan of Care (Signed)
Problem: Consults Goal: Chaplain Consult [facility] Goal: Lactation Consult Initiated if indicated Outcome: Not Applicable Date Met:  13/08/65 [facility] Goal: Neonatologist Consult Outcome: Not Applicable Date Met:  78/46/96 [facility] Goal: NICU Tour Outcome: Not Applicable Date Met:  29/52/84 [facility]     Problem: Phase I Progression Outcomes Goal: FHR checked 5 minutes after meds (ROM) Rupture of Membranes Outcome: Not Applicable Date Met:  13/24/40 [facility]  Problem: Phase II Progression Outcomes Goal: Fetal monitoring per orders Outcome: Not Applicable Date Met:  11/01/23 [facility] Goal: Initiate breastfeeding within 1hr delivery Outcome: Not Applicable Date Met:  36/64/40 [facility]

## 2013-09-06 NOTE — MAU Note (Signed)
Pt reports she started having abd cramping at 10 am and then started bleeding.  Reports bleeding is heavy changed pad 3 times since 12.

## 2013-09-06 NOTE — Anesthesia Preprocedure Evaluation (Addendum)
Anesthesia Evaluation  Patient identified by MRN, date of birth, ID band Patient awake    Reviewed: Allergy & Precautions, H&P , NPO status , Patient's Chart, lab work & pertinent test results  Airway Mallampati: II TM Distance: >3 FB Neck ROM: Full    Dental no notable dental hx.    Pulmonary asthma , Current Smoker,  breath sounds clear to auscultation  Pulmonary exam normal       Cardiovascular negative cardio ROS  Rhythm:Regular Rate:Normal     Neuro/Psych negative neurological ROS  negative psych ROS   GI/Hepatic Neg liver ROS, GERD-  Medicated,  Endo/Other  negative endocrine ROS  Renal/GU negative Renal ROS     Musculoskeletal negative musculoskeletal ROS (+)   Abdominal   Peds  Hematology negative hematology ROS (+)   Anesthesia Other Findings   Reproductive/Obstetrics negative OB ROS                          Anesthesia Physical Anesthesia Plan  ASA: II  Anesthesia Plan: MAC   Post-op Pain Management:    Induction: Intravenous  Airway Management Planned:   Additional Equipment:   Intra-op Plan:   Post-operative Plan:   Informed Consent: I have reviewed the patients History and Physical, chart, labs and discussed the procedure including the risks, benefits and alternatives for the proposed anesthesia with the patient or authorized representative who has indicated his/her understanding and acceptance.   Dental advisory given  Plan Discussed with: CRNA  Anesthesia Plan Comments:        Anesthesia Quick Evaluation

## 2013-09-06 NOTE — H&P (Signed)
Bonnie Shepard is a 28 y.o. female presenting for vaginal bleeding  28 year old gravida 1 para 0 at 15+3 weeks presents for evaluation of vaginal bleeding. A speculum exam was performed by the nurse midwife in maternity admissions. Upon speculum exam she reported visualization of the umbilical cord protruding through the cervical os. Fetal heart tones could not be auscultated. A bedside ultrasound was performed. Ultrasound demonstrated the fetal vertex to be within the cervical os. Anhydramnios was noted. No fetal heart tones were visualized. The decision was made to admit the patient to labor and delivery for pain management and anticipated delivery.  The night before admission the patient states that she was having nausea and vomiting. She was vomiting every hour. Approximately 10 AM this morning she began experiencing vaginal bleeding. By noon she says that she started feeling cramping. The patient did not present to maternity admissions until later in the day due to needing to secure coverage for her work. She denies fever, she denies any sick contacts, she denies consuming any foods normally associated with Listeria contamination.  History OB History   Grav Para Term Preterm Abortions TAB SAB Ect Mult Living   1              Past Medical History  Diagnosis Date  . Acid reflux   . Asthma   . Infection     freq UTI  . Vaginal Pap smear, abnormal     f/u wnl   Past Surgical History  Procedure Laterality Date  . Tonsillectomy     Family History: family history includes Cancer in her other; Heart disease in her maternal grandmother; Hypertension in her father, maternal grandmother, mother, and other. There is no history of Hearing loss. Social History:  reports that she has been smoking Cigarettes.  She has been smoking about 0.00 packs per day for the past 7 years. She has never used smokeless tobacco. She reports that she does not drink alcohol or use illicit drugs.   Prenatal Transfer  Tool  Maternal Diabetes: Too early for screening Genetic Screening: Normal NIPT <1:10000 T13/18/21, female gender Maternal Ultrasounds/Referrals: formal anatomy ultrasound not yet performed. First trimester ultrasound within normal limits Fetal Ultrasounds or other Referrals:  None Maternal Substance Abuse:  Occassional tobacco Significant Maternal Medications:  None Significant Maternal Lab Results:  WBC 40K Other Comments:    Results for orders placed during the hospital encounter of 09/06/13 (from the past 24 hour(s))  CBC WITH DIFFERENTIAL     Status: Abnormal   Collection Time    09/06/13  6:01 PM      Result Value Ref Range   WBC 41.2 (*) 4.0 - 10.5 K/uL   RBC 4.17  3.87 - 5.11 MIL/uL   Hemoglobin 13.7  12.0 - 15.0 g/dL   HCT 69.6  29.5 - 28.4 %   MCV 89.9  78.0 - 100.0 fL   MCH 32.9  26.0 - 34.0 pg   MCHC 36.5 (*) 30.0 - 36.0 g/dL   RDW 13.2  44.0 - 10.2 %   Platelets 229  150 - 400 K/uL   Neutrophils Relative % 92 (*) 43 - 77 %   Lymphocytes Relative 4 (*) 12 - 46 %   Monocytes Relative 4  3 - 12 %   Eosinophils Relative 0  0 - 5 %   Basophils Relative 0  0 - 1 %   Neutro Abs 38.0 (*) 1.7 - 7.7 K/uL   Lymphs Abs 1.6  0.7 -  4.0 K/uL   Monocytes Absolute 1.6 (*) 0.1 - 1.0 K/uL   Eosinophils Absolute 0.0  0.0 - 0.7 K/uL   Basophils Absolute 0.0  0.0 - 0.1 K/uL   WBC Morphology <5% BANDS SEEN     ABO/RH     Status: None   Collection Time    09/06/13  6:02 PM      Result Value Ref Range   ABO/RH(D) O POS    TYPE AND SCREEN     Status: None   Collection Time    09/06/13  6:30 PM      Result Value Ref Range   ABO/RH(D) O POS     Antibody Screen NEG     Sample Expiration 09/09/2013      ROS    Blood pressure 130/67, pulse 86, temperature 98.5 F (36.9 C), temperature source Oral, resp. rate 20, height  (1.575 m), weight 81.194 kg (179 lb), last menstrual period 04/07/2013, SpO2 100.00%. Exam Physical Exam  Prenatal labs: ABO, Rh: --/--/O POS (09/02  1830) Antibody: NEG (09/02 1830) Rubella:  immune RPR:   nonreactive HBsAg:   negative HIV:   nonreactive GBS:   not done  Assessment/Plan: 1) admit 2) pain management with either epidural, fentanyl PCA, IV Dilaudid as per patient preference 3) if patient does not seem to be progressing we'll plan to augment with vaginal Cytotec 4) given the patient's significant Azzarello count despite being afebrile we'll proceed with IV ampicillin and gentamicin for presumed septic abortion. 5) will plan to send placenta for pathology. Also we'll perform a placental bacterial culture.   Bonnie Shepard H. 09/06/2013, 11:27 PM

## 2013-09-06 NOTE — MAU Note (Signed)
Urine in lab 

## 2013-09-06 NOTE — Progress Notes (Signed)
ANTIBIOTIC CONSULT NOTE - INITIAL  Pharmacy Consult for Gentamicin Indication: leukocytosis/spontaneous abortion  No Known Allergies  Patient Measurements: Height:  (157.5 cm) Weight: 179 lb (81.194 kg) IBW/kg (Calculated) : 50.1 Adjusted Body Weight: 59.5 kg  Vital Signs: Temp: 98.6 F (37 C) (09/02 1950) Temp src: Oral (09/02 1950) BP: 129/71 mmHg (09/02 1950) Pulse Rate: 88 (09/02 1950)  Labs:  Recent Labs  09/06/13 1801  WBC 41.2*  HGB 13.7  PLT 229    Medications:  Ampicillin 2 g IV x 1 followed by 1g Q6h  Assessment: 28 y.o. female G1P0 at [redacted]w[redacted]d admitted with spontaneous abortion. WBC elevated at 41.  Estimated Ke = 0.287, Vd = 0.33  Goal of Therapy:  Gentamicin peak 6-8 mg/L and Trough < 1 mg/L  Plan:  Gentamicin 130 mg IV every 8 hrs  Check Scr with next labs if gentamicin continued. Will check gentamicin levels if continued > 72hr or clinically indicated.  Lenore Manner [place] 09/06/2013,8:04 PM

## 2013-09-06 NOTE — MAU Note (Signed)
Pt states she started vomiting last night around 2000.  Today at 1200 she started having bright red vaginal bleeding.  States she has bleed through 3 pads since 1200, but it has lightened up now.

## 2013-09-06 NOTE — MAU Provider Note (Signed)
History     CSN: [id number]  Arrival date and time: 09/06/13 1524   First Provider Initiated Contact with Patient 09/06/13 1724      Chief Complaint  Patient presents with  . Vaginal Bleeding   HPI Bonnie Shepard 28 y.o. G1P0 at [redacted]w[redacted]d presents to MAU with nausea and vomiting that began yesterday at 8pm. She has had significant nausea and vomiting all through the pregnancy lasting 2-3 days occurring once a month.  Today at 10 am, she began having cramping that is 7-10/10 pain and at noon, she noticed bleeding of bright red blood.  She continues to have cramping in her lower abdomen as well as significant back pain.  Her bleeding was consistent from 12-2 and then lightened up some after that.  She has had a prior episode of bleeding in July that spontaneously resolved.   OB History   Grav Para Term Preterm Abortions TAB SAB Ect Mult Living   1               Past Medical History  Diagnosis Date  . Acid reflux   . Asthma   . Infection     freq UTI  . Vaginal Pap smear, abnormal     f/u wnl    Past Surgical History  Procedure Laterality Date  . Tonsillectomy      Family History  Problem Relation Age of Onset  . Hypertension Other   . Cancer Other   . Hypertension Mother   . Hypertension Father   . Hypertension Maternal Grandmother   . Heart disease Maternal Grandmother   . Hearing loss Neg Hx     History  Substance Use Topics  . Smoking status: Current Some Day Smoker -- 7 years    Types: Cigarettes  . Smokeless tobacco: Never Used  . Alcohol Use: No     Comment: occasionally    Allergies: No Known Allergies  Prescriptions prior to admission  Medication Sig Dispense Refill  . metoCLOPramide (REGLAN) 10 MG tablet Take 1 tablet (10 mg total) by mouth 3 (three) times daily with meals.  60 tablet  2  . ondansetron (ZOFRAN-ODT) 8 MG disintegrating tablet Take 8 mg by mouth every 8 (eight) hours as needed for nausea or vomiting.      . pantoprazole (PROTONIX) 40 MG  tablet Take 40 mg by mouth daily as needed (for acid reflux.).      Marland Kitchen Prenatal Vit-Fe Fumarate-FA (PRENATAL MULTIVITAMIN) TABS tablet Take 1 tablet by mouth daily at 12 noon.      . promethazine (PHENERGAN) 25 MG tablet Take 25 mg by mouth every 6 (six) hours as needed for nausea or vomiting.        Review of Systems  Constitutional: Positive for chills and diaphoresis. Negative for fever.  HENT: Positive for sore throat. Negative for congestion.   Eyes: Negative for blurred vision and double vision.  Respiratory: Positive for cough. Negative for shortness of breath and wheezing.   Cardiovascular: Negative for chest pain and palpitations.  Gastrointestinal: Positive for heartburn, nausea, vomiting, abdominal pain and constipation. Negative for diarrhea.  Genitourinary: Negative for dysuria and frequency.  Skin: Negative for itching and rash.  Neurological: Positive for weakness and headaches. Negative for dizziness and tingling.  Psychiatric/Behavioral: Negative for depression, suicidal ideas and substance abuse.   Physical Exam   Blood pressure 148/80, pulse 84, temperature 98.4 F (36.9 C), resp. rate 18, height  (1.575 m), weight 81.194 kg (179 lb), last menstrual  period 04/07/2013, SpO2 100.00%.  Physical Exam  Constitutional: She is oriented to person, place, and time. She appears well-developed and well-nourished.  HENT:  Head: Normocephalic and atraumatic.  Eyes: EOM are normal.  Neck: Normal range of motion.  Cardiovascular: Normal rate and regular rhythm.   Respiratory: Effort normal and breath sounds normal. No respiratory distress.  GI: Soft. Bowel sounds are normal. She exhibits no distension. There is no tenderness.  Genitourinary:  Moderate amt of blood in vaginal vault with minimal pooling.  Excess blood removed to reveal bluish umbilical cord.   Cervical check reveals cervix open to 2cm with fetal parts present in os.    Musculoskeletal: Normal range of motion.   Neurological: She is alert and oriented to person, place, and time.  Skin: Skin is warm and dry.  Psychiatric: She has a normal mood and affect.   Results for orders placed during the hospital encounter of 09/06/13 (from the past 24 hour(s))  CBC WITH DIFFERENTIAL     Status: Abnormal (Preliminary result)   Collection Time    09/06/13  6:01 PM      Result Value Ref Range   WBC 41.2 (*) 4.0 - 10.5 K/uL   RBC 4.17  3.87 - 5.11 MIL/uL   Hemoglobin 13.7  12.0 - 15.0 g/dL   HCT 16.1  09.6 - 04.5 %   MCV 89.9  78.0 - 100.0 fL   MCH 32.9  26.0 - 34.0 pg   MCHC 36.5 (*) 30.0 - 36.0 g/dL   RDW 40.9  81.1 - 91.4 %   Platelets 229  150 - 400 K/uL   Neutrophils Relative % PENDING  43 - 77 %   Neutro Abs PENDING  1.7 - 7.7 K/uL   Band Neutrophils PENDING  0 - 10 %   Lymphocytes Relative PENDING  12 - 46 %   Lymphs Abs PENDING  0.7 - 4.0 K/uL   Monocytes Relative PENDING  3 - 12 %   Monocytes Absolute PENDING  0.1 - 1.0 K/uL   Eosinophils Relative PENDING  0 - 5 %   Eosinophils Absolute PENDING  0.0 - 0.7 K/uL   Basophils Relative PENDING  0 - 1 %   Basophils Absolute PENDING  0.0 - 0.1 K/uL   WBC Morphology PENDING     RBC Morphology PENDING     Smear Review PENDING     nRBC PENDING  0 /100 WBC   Metamyelocytes Relative PENDING     Myelocytes PENDING     Promyelocytes Absolute PENDING     Blasts PENDING     Prelim U/S report reveals fetal head in cervix.  No cardiac activity.   MAU Course  Procedures None  IV in place - LR - given  Pepcid,  Phenergan -  Dilaudid.  Pt continues to feel cramping q 5 minutes.  She reports feeling like the baby is coming out.    MDM Discussed with Dr. Fredia Sorrow.  Informed of U/S report/Leukocytosis/orders thus far.  She plans to give nurse orders to admit pt to L&D for further care.  Assessment and Plan  A: Spontaneous Abortion  P: Admit to L&D.   Care turned over to Dr. Tenny Craw.    Bertram Denver 09/06/2013, 5:26 PM

## 2013-09-07 ENCOUNTER — Encounter (HOSPITAL_COMMUNITY)
Admission: AD | Disposition: A | Discharge status: Home / Self Care | Payer: Self-pay | Source: Ambulatory Visit | Attending: Obstetrics and Gynecology

## 2013-09-07 ENCOUNTER — Encounter (HOSPITAL_COMMUNITY): Payer: BC Managed Care – PPO | Admitting: Anesthesiology

## 2013-09-07 ENCOUNTER — Inpatient Hospital Stay (HOSPITAL_COMMUNITY): Payer: BC Managed Care – PPO | Admitting: Anesthesiology

## 2013-09-07 ENCOUNTER — Encounter (HOSPITAL_COMMUNITY): Payer: Self-pay | Admitting: Anesthesiology

## 2013-09-07 DIAGNOSIS — IMO0001 Reserved for inherently not codable concepts without codable children: Secondary | ICD-10-CM

## 2013-09-07 HISTORY — PX: DILATION AND EVACUATION: SHX1459

## 2013-09-07 LAB — CBC
HCT: 30.8 % — ABNORMAL LOW (ref 36.0–46.0)
Hemoglobin: 10.9 g/dL — ABNORMAL LOW (ref 12.0–15.0)
MCH: 32.3 pg (ref 26.0–34.0)
MCHC: 35.4 g/dL (ref 30.0–36.0)
MCV: 91.4 fL (ref 78.0–100.0)
Platelets: 192 10*3/uL (ref 150–400)
RBC: 3.37 MIL/uL — ABNORMAL LOW (ref 3.87–5.11)
RDW: 12.9 % (ref 11.5–15.5)
WBC: 34.6 10*3/uL — ABNORMAL HIGH (ref 4.0–10.5)

## 2013-09-07 SURGERY — DILATION AND EVACUATION, UTERUS
Anesthesia: Monitor Anesthesia Care | Site: Vagina

## 2013-09-07 SURGERY — Surgical Case
Anesthesia: Regional

## 2013-09-07 MED ORDER — MIDAZOLAM HCL 5 MG/5ML IJ SOLN
INTRAMUSCULAR | Status: DC | PRN
  Administered 2013-09-07: 2 mg via INTRAVENOUS

## 2013-09-07 MED ORDER — CITRIC ACID-SODIUM CITRATE 334-500 MG/5ML PO SOLN: 30.0000 mL | ORAL | Status: DC | PRN

## 2013-09-07 MED ORDER — IBUPROFEN 600 MG PO TABS
600.0000 mg | ORAL_TABLET | Freq: Four times a day (QID) | ORAL | Status: DC
  Administered 2013-09-07 – 2013-09-08 (×5): 600 mg via ORAL
  Filled 2013-09-07 (×5): qty 1

## 2013-09-07 MED ORDER — OXYCODONE-ACETAMINOPHEN 5-325 MG PO TABS: 1.0000 | ORAL_TABLET | ORAL | Status: DC | PRN

## 2013-09-07 MED ORDER — BENZOCAINE-MENTHOL 20-0.5 % EX AERO: 1.0000 "application " | INHALATION_SPRAY | CUTANEOUS | Status: DC | PRN

## 2013-09-07 MED ORDER — FLEET ENEMA 7-19 GM/118ML RE ENEM: 1.0000 | ENEMA | RECTAL | Status: DC | PRN

## 2013-09-07 MED ORDER — MISOPROSTOL 200 MCG PO TABS
ORAL_TABLET | ORAL | Status: AC
  Filled 2013-09-07: qty 2

## 2013-09-07 MED ORDER — ONDANSETRON HCL 4 MG/2ML IJ SOLN
INTRAMUSCULAR | Status: DC | PRN
  Administered 2013-09-07: 4 mg via INTRAVENOUS

## 2013-09-07 MED ORDER — LACTATED RINGERS IV SOLN: 500.0000 mL | INTRAVENOUS | Status: DC | PRN

## 2013-09-07 MED ORDER — ONDANSETRON HCL 4 MG PO TABS: 4.0000 mg | ORAL_TABLET | ORAL | Status: DC | PRN

## 2013-09-07 MED ORDER — FAMOTIDINE 20 MG PO TABS: 20.0000 mg | ORAL_TABLET | Freq: Two times a day (BID) | ORAL | Status: DC

## 2013-09-07 MED ORDER — OXYTOCIN 10 UNIT/ML IJ SOLN
INTRAMUSCULAR | Status: AC
  Filled 2013-09-07: qty 2

## 2013-09-07 MED ORDER — METHYLERGONOVINE MALEATE 0.2 MG/ML IJ SOLN
INTRAMUSCULAR | Status: DC | PRN
  Administered 2013-09-07: 0.2 mg via INTRAMUSCULAR

## 2013-09-07 MED ORDER — DOXYCYCLINE HYCLATE 100 MG PO TABS
100.0000 mg | ORAL_TABLET | Freq: Two times a day (BID) | ORAL | Status: DC
  Administered 2013-09-07 – 2013-09-08 (×3): 100 mg via ORAL
  Filled 2013-09-07 (×5): qty 1

## 2013-09-07 MED ORDER — VASOPRESSIN 20 UNIT/ML IJ SOLN
INTRAMUSCULAR | Status: AC
  Filled 2013-09-07: qty 1

## 2013-09-07 MED ORDER — LIDOCAINE HCL (PF) 1 % IJ SOLN: 30.0000 mL | INTRAMUSCULAR | Status: DC | PRN

## 2013-09-07 MED ORDER — SIMETHICONE 80 MG PO CHEW: 80.0000 mg | CHEWABLE_TABLET | ORAL | Status: DC | PRN

## 2013-09-07 MED ORDER — LIDOCAINE HCL 1 % IJ SOLN
INTRAMUSCULAR | Status: AC
  Filled 2013-09-07: qty 20

## 2013-09-07 MED ORDER — OXYCODONE HCL 5 MG/5ML PO SOLN: 5.0000 mg | Freq: Once | ORAL | Status: DC | PRN

## 2013-09-07 MED ORDER — MEPERIDINE HCL 25 MG/ML IJ SOLN: 6.2500 mg | INTRAMUSCULAR | Status: DC | PRN

## 2013-09-07 MED ORDER — LIDOCAINE HCL 1 % IJ SOLN
INTRAMUSCULAR | Status: DC | PRN
  Administered 2013-09-07 (×2): 10 mL

## 2013-09-07 MED ORDER — HYDROMORPHONE HCL PF 1 MG/ML IJ SOLN: 0.2500 mg | INTRAMUSCULAR | Status: DC | PRN

## 2013-09-07 MED ORDER — TETANUS-DIPHTH-ACELL PERTUSSIS 5-2.5-18.5 LF-MCG/0.5 IM SUSP: 0.5000 mL | Freq: Once | INTRAMUSCULAR | Status: DC

## 2013-09-07 MED ORDER — SENNOSIDES-DOCUSATE SODIUM 8.6-50 MG PO TABS
2.0000 | ORAL_TABLET | ORAL | Status: DC
  Administered 2013-09-08: 2 via ORAL
  Filled 2013-09-07: qty 2

## 2013-09-07 MED ORDER — VASOPRESSIN 20 UNIT/ML IJ SOLN
INTRAVENOUS | Status: DC | PRN
  Administered 2013-09-07: 08:00:00 via INTRAMUSCULAR

## 2013-09-07 MED ORDER — LACTATED RINGERS IV SOLN
INTRAVENOUS | Status: DC | PRN
  Administered 2013-09-07 (×3): via INTRAVENOUS

## 2013-09-07 MED ORDER — WITCH HAZEL-GLYCERIN EX PADS: 1.0000 "application " | MEDICATED_PAD | CUTANEOUS | Status: DC | PRN

## 2013-09-07 MED ORDER — LIDOCAINE HCL (CARDIAC) 20 MG/ML IV SOLN
INTRAVENOUS | Status: DC | PRN
  Administered 2013-09-07: 60 mg via INTRAVENOUS

## 2013-09-07 MED ORDER — PROPOFOL INFUSION 10 MG/ML OPTIME
INTRAVENOUS | Status: DC | PRN
  Administered 2013-09-07: 60 ug/kg/min via INTRAVENOUS

## 2013-09-07 MED ORDER — MISOPROSTOL 200 MCG PO TABS
800.0000 ug | ORAL_TABLET | Freq: Once | ORAL | Status: AC
  Administered 2013-09-07: 800 ug via VAGINAL
  Filled 2013-09-07: qty 4

## 2013-09-07 MED ORDER — PROMETHAZINE HCL 25 MG/ML IJ SOLN: 6.2500 mg | INTRAMUSCULAR | Status: DC | PRN

## 2013-09-07 MED ORDER — MIDAZOLAM HCL 2 MG/2ML IJ SOLN
INTRAMUSCULAR | Status: AC
  Filled 2013-09-07: qty 2

## 2013-09-07 MED ORDER — LIDOCAINE HCL (CARDIAC) 20 MG/ML IV SOLN
INTRAVENOUS | Status: AC
  Filled 2013-09-07: qty 5

## 2013-09-07 MED ORDER — OXYTOCIN 40 UNITS IN LACTATED RINGERS INFUSION - SIMPLE MED: 62.5000 mL/h | INTRAVENOUS | Status: DC

## 2013-09-07 MED ORDER — ONDANSETRON HCL 4 MG/2ML IJ SOLN
INTRAMUSCULAR | Status: AC
  Filled 2013-09-07: qty 2

## 2013-09-07 MED ORDER — ONDANSETRON HCL 4 MG/2ML IJ SOLN
4.0000 mg | INTRAMUSCULAR | Status: DC | PRN
  Administered 2013-09-07: 4 mg via INTRAVENOUS
  Filled 2013-09-07: qty 2

## 2013-09-07 MED ORDER — OXYCODONE-ACETAMINOPHEN 5-325 MG PO TABS: 2.0000 | ORAL_TABLET | ORAL | Status: DC | PRN

## 2013-09-07 MED ORDER — SODIUM CHLORIDE 0.9 % IJ SOLN
INTRAMUSCULAR | Status: AC
  Filled 2013-09-07: qty 50

## 2013-09-07 MED ORDER — ONDANSETRON HCL 4 MG/2ML IJ SOLN: 4.0000 mg | Freq: Four times a day (QID) | INTRAMUSCULAR | Status: DC | PRN

## 2013-09-07 MED ORDER — FENTANYL CITRATE 0.05 MG/ML IJ SOLN
INTRAMUSCULAR | Status: AC
  Filled 2013-09-07: qty 2

## 2013-09-07 MED ORDER — OXYTOCIN 40 UNITS IN LACTATED RINGERS INFUSION - SIMPLE MED
INTRAVENOUS | Status: DC | PRN
  Administered 2013-09-07: 40 [IU] via INTRAVENOUS

## 2013-09-07 MED ORDER — DIBUCAINE 1 % RE OINT: 1.0000 "application " | TOPICAL_OINTMENT | RECTAL | Status: DC | PRN

## 2013-09-07 MED ORDER — PROPOFOL 10 MG/ML IV EMUL
INTRAVENOUS | Status: AC
  Filled 2013-09-07: qty 40

## 2013-09-07 MED ORDER — MISOPROSTOL 100 MCG PO TABS
ORAL_TABLET | ORAL | Status: DC | PRN
  Administered 2013-09-07: 800 ug via RECTAL

## 2013-09-07 MED ORDER — OXYTOCIN BOLUS FROM INFUSION: 500.0000 mL | INTRAVENOUS | Status: DC

## 2013-09-07 MED ORDER — LACTATED RINGERS IV SOLN: INTRAVENOUS | Status: DC

## 2013-09-07 MED ORDER — PRENATAL MULTIVITAMIN CH
1.0000 | ORAL_TABLET | Freq: Every day | ORAL | Status: DC
  Administered 2013-09-07 – 2013-09-08 (×2): 1 via ORAL
  Filled 2013-09-07 (×2): qty 1

## 2013-09-07 MED ORDER — DEXTROSE 5 % IV SOLN
2.0000 g | Freq: Four times a day (QID) | INTRAVENOUS | Status: DC
  Administered 2013-09-07 – 2013-09-08 (×5): 2 g via INTRAVENOUS
  Filled 2013-09-07 (×7): qty 2

## 2013-09-07 MED ORDER — FAMOTIDINE 20 MG PO TABS
20.0000 mg | ORAL_TABLET | Freq: Two times a day (BID) | ORAL | Status: DC | PRN
  Administered 2013-09-07: 20 mg via ORAL
  Filled 2013-09-07: qty 1

## 2013-09-07 MED ORDER — OXYCODONE HCL 5 MG PO TABS: 5.0000 mg | ORAL_TABLET | Freq: Once | ORAL | Status: DC | PRN

## 2013-09-07 MED ORDER — LANOLIN HYDROUS EX OINT: TOPICAL_OINTMENT | CUTANEOUS | Status: DC | PRN

## 2013-09-07 MED ORDER — ACETAMINOPHEN 325 MG PO TABS: 650.0000 mg | ORAL_TABLET | ORAL | Status: DC | PRN

## 2013-09-07 MED ORDER — CITRIC ACID-SODIUM CITRATE 334-500 MG/5ML PO SOLN
ORAL | Status: AC
  Administered 2013-09-07: 30 mL
  Filled 2013-09-07: qty 15

## 2013-09-07 MED ORDER — DIPHENHYDRAMINE HCL 25 MG PO CAPS: 25.0000 mg | ORAL_CAPSULE | Freq: Four times a day (QID) | ORAL | Status: DC | PRN

## 2013-09-07 MED ORDER — CALCIUM CARBONATE ANTACID 500 MG PO CHEW
1.0000 | CHEWABLE_TABLET | Freq: Three times a day (TID) | ORAL | Status: DC | PRN
  Administered 2013-09-07: 200 mg via ORAL
  Filled 2013-09-07: qty 1

## 2013-09-07 MED ORDER — FENTANYL CITRATE 0.05 MG/ML IJ SOLN
INTRAMUSCULAR | Status: DC | PRN
  Administered 2013-09-07: 25 ug via INTRAVENOUS

## 2013-09-07 MED ORDER — MISOPROSTOL 200 MCG PO TABS
ORAL_TABLET | ORAL | Status: AC
  Filled 2013-09-07: qty 4

## 2013-09-07 MED ORDER — ZOLPIDEM TARTRATE 5 MG PO TABS: 5.0000 mg | ORAL_TABLET | Freq: Every evening | ORAL | Status: DC | PRN

## 2013-09-07 SURGICAL SUPPLY — 19 items
ADAPTER VACURETTE TBG SET 14 (CANNULA) ×1 IMPLANT
CATH ROBINSON RED A/P 16FR (CATHETERS) ×2 IMPLANT
CLOTH BEACON ORANGE TIMEOUT ST (SAFETY) ×2 IMPLANT
DECANTER SPIKE VIAL GLASS SM (MISCELLANEOUS) ×2 IMPLANT
DILATOR CANAL MILEX (MISCELLANEOUS) IMPLANT
GLOVE BIO SURGEON STRL SZ7 (GLOVE) ×2 IMPLANT
GOWN STRL REUS W/TWL LRG LVL3 (GOWN DISPOSABLE) ×4 IMPLANT
KIT BERKELEY 1ST TRIMESTER 3/8 (MISCELLANEOUS) ×2 IMPLANT
NS IRRIG 1000ML POUR BTL (IV SOLUTION) ×2 IMPLANT
PACK VAGINAL MINOR WOMEN LF (CUSTOM PROCEDURE TRAY) ×2 IMPLANT
PAD OB MATERNITY 4.3X12.25 (PERSONAL CARE ITEMS) ×2 IMPLANT
PAD PREP 24X48 CUFFED NSTRL (MISCELLANEOUS) ×2 IMPLANT
SET BERKELEY SUCTION TUBING (SUCTIONS) ×2 IMPLANT
TOWEL OR 17X24 6PK STRL BLUE (TOWEL DISPOSABLE) ×4 IMPLANT
VACURETTE 10 RIGID CVD (CANNULA) IMPLANT
VACURETTE 14MM CVD 1/2 BASE (CANNULA) ×1 IMPLANT
VACURETTE 7MM CVD STRL WRAP (CANNULA) IMPLANT
VACURETTE 8 RIGID CVD (CANNULA) IMPLANT
VACURETTE 9 RIGID CVD (CANNULA) IMPLANT

## 2013-09-07 SURGICAL SUPPLY — 31 items
ADH SKN CLS APL DERMABOND .7 (GAUZE/BANDAGES/DRESSINGS)
BLADE SURG 10 STRL SS (BLADE) ×2 IMPLANT
CLAMP CORD UMBIL (MISCELLANEOUS) IMPLANT
CLOTH BEACON ORANGE TIMEOUT ST (SAFETY) ×1 IMPLANT
DERMABOND ADVANCED (GAUZE/BANDAGES/DRESSINGS)
DERMABOND ADVANCED .7 DNX12 (GAUZE/BANDAGES/DRESSINGS) IMPLANT
DRAPE LG THREE QUARTER DISP (DRAPES) IMPLANT
DRSG OPSITE POSTOP 4X10 (GAUZE/BANDAGES/DRESSINGS) ×1 IMPLANT
DURAPREP 26ML APPLICATOR (WOUND CARE) ×1 IMPLANT
ELECT REM PT RETURN 9FT ADLT (ELECTROSURGICAL)
ELECTRODE REM PT RTRN 9FT ADLT (ELECTROSURGICAL) ×1 IMPLANT
EXTRACTOR VACUUM M CUP 4 TUBE (SUCTIONS) IMPLANT
GLOVE BIO SURGEON STRL SZ7 (GLOVE) ×1 IMPLANT
GOWN STRL REUS W/TWL LRG LVL3 (GOWN DISPOSABLE) ×2 IMPLANT
KIT ABG SYR 3ML LUER SLIP (SYRINGE) IMPLANT
NDL HYPO 25X5/8 SAFETYGLIDE (NEEDLE) IMPLANT
NEEDLE HYPO 25X5/8 SAFETYGLIDE (NEEDLE) IMPLANT
NS IRRIG 1000ML POUR BTL (IV SOLUTION) ×1 IMPLANT
PACK C SECTION WH (CUSTOM PROCEDURE TRAY) ×1 IMPLANT
PAD OB MATERNITY 4.3X12.25 (PERSONAL CARE ITEMS) ×1 IMPLANT
RTRCTR C-SECT PINK 25CM LRG (MISCELLANEOUS) ×1 IMPLANT
STAPLER VISISTAT 35W (STAPLE) IMPLANT
SUT CHROMIC 1 CTX 36 (SUTURE) ×2 IMPLANT
SUT CHROMIC 2 0 CT 1 (SUTURE) ×1 IMPLANT
SUT PDS AB 0 CTX 60 (SUTURE) ×1 IMPLANT
SUT VIC AB 2-0 CT1 27 (SUTURE)
SUT VIC AB 2-0 CT1 TAPERPNT 27 (SUTURE) ×1 IMPLANT
SUT VIC AB 4-0 KS 27 (SUTURE) IMPLANT
TOWEL OR 17X24 6PK STRL BLUE (TOWEL DISPOSABLE) ×1 IMPLANT
TRAY FOLEY CATH 14FR (SET/KITS/TRAYS/PACK) ×1 IMPLANT
WATER STERILE IRR 1000ML POUR (IV SOLUTION) ×1 IMPLANT

## 2013-09-07 NOTE — Transfer of Care (Signed)
Immediate Anesthesia Transfer of Care Note  Patient: Bonnie Shepard  Procedure(s) Performed: Procedure(s): DILATATION AND EVACUATION (N/A)  Patient Location: PACU  Anesthesia Type:MAC  Level of Consciousness: awake, alert , oriented and patient cooperative  Airway & Oxygen Therapy: Patient Spontanous Breathing  Post-op Assessment: Report given to PACU RN and Post -op Vital signs reviewed and stable  Post vital signs: Reviewed and stable  Complications: No apparent anesthesia complications

## 2013-09-07 NOTE — Anesthesia Postprocedure Evaluation (Signed)
Anesthesia Post Note  Patient: Bonnie Shepard  Procedure(s) Performed: Procedure(s) (LRB): DILATATION AND EVACUATION (N/A)  Anesthesia type: MAC  Patient location: PACU  Post pain: Pain level controlled  Post assessment: Post-op Vital signs reviewed  Last Vitals:  Filed Vitals:   09/07/13 0808  BP: 128/78  Pulse: 67  Temp: 37.1 C  Resp: 20    Post vital signs: Reviewed  Level of consciousness: sedated  Complications: No apparent anesthesia complications

## 2013-09-07 NOTE — Op Note (Signed)
Pre-Operative Diagnosis: 1) Retained placenta after delivery of 15 week fetal demise  Postoperative Diagnosis: Same Procedure: Suction Dilation and Curettage with Ultrasound guidance Surgeon: Dr. Waynard Reeds Assistant: None Operative Findings: Retained placental tissue Specimen: Retained placenta EBL: 450cc  Ms. Nevers Is a 28 year old gravida 1 para 0010 who presents for definitive surgical management for retained placenta after delivery of a 15 week demised fetus. Please see the patient's history and physical for complete details of the history. Management options were discussed with the patient. R/B/A reviewed. Following appropriate informed consent was taken to the operating room. The patient was appropriately identified during a time out procedure. MAC anesthesia was administered and the patient was placed in the dorsal lithotomy position. The patient was prepped and draped in the normal sterile fashion. With vaginal prep approximately 200 cc of clotted blood was removed from the vagina. A speculum was placed into the vagina, a single-tooth tenaculum was placed on the anterior lip of the cervix, and 20 cc of 1% lidocaine was administered in a paracervical fashion. Cervical dilation was not required. A 14 suction curet was then passed to the fundus, the vacuum was engaged, and 3 suction passes were performed with a curette. A Sharp curettage was performed and a gritty texture was noted. A final suction pass was performed with minimal results. A bedside abdominal ultrasound was performed and showed no additional retained placental tissue. The patient had some persistent trickling of blood. IV pitocin was started and methergine 0.2mg  was given IM. Rectal cytotech was placed. She continued to have some persistent trickling. An additional 20 cc of dilute pitressin 20 units in 50cc of NS was administered in a paracervical fashion. Repeat TAUS did not show any hematometria or retained placenta. Bleeding  was minimal. This completed the procedure. The patient tolerated the procedure well was brought to the recovery room in stable condition for the procedure. All sponge and needle counts correct x2.

## 2013-09-07 NOTE — Progress Notes (Signed)
Patient ID: Bonnie Shepard, female   DOB: 01-17-1985, 28 y.o.   MRN: [id number]  Pt is s/p 2 doses of 800 mcg of cytotec. On vaginal exam a portion of the placenta was palpated in the cervix. Prior to administering narcotic pain medication the patient was consented for suction D&C if bedside removal was unsuccessful. The patient was administered a milligram of Dilaudid and then placed in stirrups. Speculum was placed in the vagina. A transabdominal ultrasound was performed while attempts were made to remove the placenta with ring forceps. The placenta was noted to be densely adherent to the posterior uterine wall and could not easily be removed. The decision was made to proceed with suction dilation and evacuation.

## 2013-09-07 NOTE — Addendum Note (Signed)
Addendum created 09/07/13 1354 by Algis Greenhouse, CRNA   Modules edited: Notes Section   Notes Section:  File: [id number]

## 2013-09-07 NOTE — Care Management Note (Signed)
Dr. Tenny Craw attempted to remove placenta from uterus, decision made to take to OR for a D & C.

## 2013-09-07 NOTE — Anesthesia Postprocedure Evaluation (Signed)
  Anesthesia Post-op Note  Patient: Bonnie Shepard  Procedure(s) Performed: Procedure(s): DILATATION AND EVACUATION (N/A)  Patient Location: PACU and Women's Unit  Anesthesia Type:MAC  Level of Consciousness: awake  Airway and Oxygen Therapy: Patient Spontanous Breathing  Post-op Pain: mild  Post-op Assessment: Post-op Vital signs reviewed  Post-op Vital Signs: Reviewed and stable  Last Vitals:  Filed Vitals:   09/07/13 1100  BP: 119/70  Pulse: 77  Temp: 36.8 C  Resp: 18    Complications: No apparent anesthesia complications

## 2013-09-08 ENCOUNTER — Encounter (HOSPITAL_COMMUNITY): Payer: Self-pay | Admitting: Obstetrics and Gynecology

## 2013-09-08 LAB — CBC WITH DIFFERENTIAL/PLATELET
Basophils Absolute: 0 10*3/uL (ref 0.0–0.1)
Basophils Relative: 0 % (ref 0–1)
Eosinophils Absolute: 0.1 10*3/uL (ref 0.0–0.7)
Eosinophils Relative: 1 % (ref 0–5)
HCT: 25.3 % — ABNORMAL LOW (ref 36.0–46.0)
Hemoglobin: 8.6 g/dL — ABNORMAL LOW (ref 12.0–15.0)
Lymphocytes Relative: 19 % (ref 12–46)
Lymphs Abs: 2.1 10*3/uL (ref 0.7–4.0)
MCH: 31.6 pg (ref 26.0–34.0)
MCHC: 34 g/dL (ref 30.0–36.0)
MCV: 93 fL (ref 78.0–100.0)
Monocytes Absolute: 0.5 10*3/uL (ref 0.1–1.0)
Monocytes Relative: 5 % (ref 3–12)
Neutro Abs: 8.8 10*3/uL — ABNORMAL HIGH (ref 1.7–7.7)
Neutrophils Relative %: 76 % (ref 43–77)
Platelets: 157 10*3/uL (ref 150–400)
RBC: 2.72 MIL/uL — ABNORMAL LOW (ref 3.87–5.11)
RDW: 13 % (ref 11.5–15.5)
WBC: 11.5 10*3/uL — ABNORMAL HIGH (ref 4.0–10.5)

## 2013-09-08 MED ORDER — DOXYCYCLINE HYCLATE 100 MG PO TABS: 100.0000 mg | ORAL_TABLET | Freq: Two times a day (BID) | ORAL | Status: AC

## 2013-09-08 NOTE — Progress Notes (Signed)
Discharge teaching complete. Pt understood all instructions and did not have any questions. Pt walked out of the hospital with tech and discharged home to family.

## 2013-09-08 NOTE — Progress Notes (Signed)
Bonnie Shepard is coping as well as can be expected with the loss of her baby at 15 weeks.  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  Her SO is at home with his family and she is eager to get home to spend time together.  She was given information on Heartstrings and contact information for [place] for follow-up support.  Please page as needs arise, [phone].  Chaplain Dyanne Carrel 12:40 PM   09/08/13 1200  Clinical Encounter Type  Visited With Patient;Patient and family together  Visit Type Spiritual support  Referral From Nurse  Spiritual Encounters  Spiritual Needs Emotional

## 2013-09-08 NOTE — Discharge Summary (Signed)
  Pt was admitted 9/2 from MAU after nonviable [redacted]w[redacted]d fetal demise confirmed in MAU.  Pt proceed to have vaginal delivery of female fetus, apgars 0,0 and WBC was noted to be >40, while pt remained afebrile throughout her stay.  D&E was performed for retained placenta after cytotec x 2 unsuccessful. She received IV abx, see record for details and by POD#1 WBC normalized to 11.  She reported feeling well without fever or tenderness and was discharged POD #1 with 14d Rx for doxycycline 100bid, and given instructions to f/u with Dr. Tenny Craw in 1 week.

## 2013-09-11 LAB — TISSUE CULTURE

## 2013-09-13 LAB — ANAEROBIC CULTURE

## 2013-11-06 ENCOUNTER — Encounter (HOSPITAL_COMMUNITY): Payer: Self-pay | Admitting: Obstetrics and Gynecology

## 2014-01-18 ENCOUNTER — Encounter (HOSPITAL_COMMUNITY): Payer: Self-pay | Admitting: Obstetrics and Gynecology

## 2014-05-07 ENCOUNTER — Encounter (HOSPITAL_COMMUNITY): Payer: Self-pay | Admitting: *Deleted

## 2014-06-14 ENCOUNTER — Encounter (HOSPITAL_COMMUNITY): Payer: Self-pay | Admitting: Obstetrics and Gynecology

## 2014-07-28 ENCOUNTER — Emergency Department (HOSPITAL_COMMUNITY)
Admission: EM | Admit: 2014-07-28 | Discharge: 2014-07-28 | Disposition: A | Discharge status: Home / Self Care | Payer: Self-pay | Attending: Emergency Medicine | Admitting: Emergency Medicine

## 2014-07-28 ENCOUNTER — Encounter (HOSPITAL_COMMUNITY): Payer: Self-pay | Admitting: Emergency Medicine

## 2014-07-28 DIAGNOSIS — Y9389 Activity, other specified: Secondary | ICD-10-CM | POA: Insufficient documentation

## 2014-07-28 DIAGNOSIS — W540XXA Bitten by dog, initial encounter: Secondary | ICD-10-CM | POA: Insufficient documentation

## 2014-07-28 DIAGNOSIS — J45909 Unspecified asthma, uncomplicated: Secondary | ICD-10-CM | POA: Insufficient documentation

## 2014-07-28 DIAGNOSIS — Y998 Other external cause status: Secondary | ICD-10-CM | POA: Insufficient documentation

## 2014-07-28 DIAGNOSIS — Z8719 Personal history of other diseases of the digestive system: Secondary | ICD-10-CM | POA: Insufficient documentation

## 2014-07-28 DIAGNOSIS — Y9289 Other specified places as the place of occurrence of the external cause: Secondary | ICD-10-CM | POA: Insufficient documentation

## 2014-07-28 DIAGNOSIS — Z87440 Personal history of urinary (tract) infections: Secondary | ICD-10-CM | POA: Insufficient documentation

## 2014-07-28 DIAGNOSIS — R200 Anesthesia of skin: Secondary | ICD-10-CM | POA: Insufficient documentation

## 2014-07-28 DIAGNOSIS — Z720 Tobacco use: Secondary | ICD-10-CM | POA: Insufficient documentation

## 2014-07-28 DIAGNOSIS — S51852A Open bite of left forearm, initial encounter: Secondary | ICD-10-CM | POA: Insufficient documentation

## 2014-07-28 MED ORDER — AMOXICILLIN-POT CLAVULANATE 875-125 MG PO TABS: 1.0000 | ORAL_TABLET | Freq: Two times a day (BID) | ORAL | Status: DC

## 2014-07-28 NOTE — Discharge Instructions (Signed)
Full use of your hand as tolerated. Recheck with any discoloration, or any worsening symptoms of numbness or weakness with your hand.  Animal Bite An animal bite can result in a scratch on the skin, deep open cut, puncture of the skin, crush injury, or tearing away of the skin or a body part. Dogs are responsible for most animal bites. Children are bitten more often than adults. An animal bite can range from very mild to more serious. A small bite from your house pet is no cause for alarm. However, some animal bites can become infected or injure a bone or other tissue. You must seek medical care if:  The skin is broken and bleeding does not slow down or stop after 15 minutes.  The puncture is deep and difficult to clean (such as a cat bite).  Pain, warmth, redness, or pus develops around the wound.  The bite is from a stray animal or rodent. There may be a risk of rabies infection.  The bite is from a snake, raccoon, skunk, fox, coyote, or bat. There may be a risk of rabies infection.  The person bitten has a chronic illness such as diabetes, liver disease, or cancer, or the person takes medicine that lowers the immune system.  There is concern about the location and severity of the bite. It is important to clean and protect an animal bite wound right away to prevent infection. Follow these steps:  Clean the wound with plenty of water and soap.  Apply an antibiotic cream.  Apply gentle pressure over the wound with a clean towel or gauze to slow or stop bleeding.  Elevate the affected area above the heart to help stop any bleeding.  Seek medical care. Getting medical care within 8 hours of the animal bite leads to the best possible outcome. DIAGNOSIS  Your caregiver will most likely:  Take a detailed history of the animal and the bite injury.  Perform a wound exam.  Take your medical history. Blood tests or X-rays may be performed. Sometimes, infected bite wounds are cultured  and sent to a lab to identify the infectious bacteria.  TREATMENT  Medical treatment will depend on the location and type of animal bite as well as the patient's medical history. Treatment may include:  Wound care, such as cleaning and flushing the wound with saline solution, bandaging, and elevating the affected area.  Antibiotics.  Tetanus immunization.  Rabies immunization.  Leaving the wound open to heal. This is often done with animal bites, due to the high risk of infection. However, in certain cases, wound closure with stitches, wound adhesive, skin adhesive strips, or staples may be used. Infected bites that are left untreated may require intravenous (IV) antibiotics and surgical treatment in the hospital. HOME CARE INSTRUCTIONS  Follow your caregiver's instructions for wound care.  Take all medicines as directed.  If your caregiver prescribes antibiotics, take them as directed. Finish them even if you start to feel better.  Follow up with your caregiver for further exams or immunizations as directed. You may need a tetanus shot if:  You cannot remember when you had your last tetanus shot.  You have never had a tetanus shot.  The injury broke your skin. If you get a tetanus shot, your arm may swell, get red, and feel warm to the touch. This is common and not a problem. If you need a tetanus shot and you choose not to have one, there is a rare chance of getting  tetanus. Sickness from tetanus can be serious. SEEK MEDICAL CARE IF:  You notice warmth, redness, soreness, swelling, pus discharge, or a bad smell coming from the wound.  You have a red line on the skin coming from the wound.  You have a fever, chills, or a general ill feeling.  You have nausea or vomiting.  You have continued or worsening pain.  You have trouble moving the injured part.  You have other questions or concerns. MAKE SURE YOU:  Understand these instructions.  Will watch your  condition.  Will get help right away if you are not doing well or get worse. Document Released: 09/09/2010 Document Revised: 03/16/2011 Document Reviewed: 09/09/2010 [facility] Patient Information 2015 [place], [place]. This information is not intended to replace advice given to you by your health care provider. Make sure you discuss any questions you have with your health care provider.

## 2014-07-28 NOTE — ED Provider Notes (Signed)
CSN: [id number]     Arrival date & time 07/28/14  4098 History   First MD Initiated Contact with Patient 07/28/14 1007     Chief Complaint  Patient presents with  . Animal Bite  . Numbness      HPI  Emergency dilation without bite. Breaking up a fight between her dog and another dog was bitten on the dorsum of her left arm small to midsize dog. The dog "they quickly let go. The dog did not helppurchase on her arm. She states her fingers feel "tingly".  Past Medical History  Diagnosis Date  . Acid reflux   . Asthma   . Infection     freq UTI  . Vaginal Pap smear, abnormal     f/u wnl   Past Surgical History  Procedure Laterality Date  . Tonsillectomy    . Dilation and evacuation N/A 09/07/2013    Procedure: DILATATION AND EVACUATION;  Surgeon: Freddrick March. Tenny Craw, MD;  Location: [facility];  Service: Gynecology;  Laterality: N/A;   Family History  Problem Relation Age of Onset  . Hypertension Other   . Cancer Other   . Hypertension Mother   . Hypertension Father   . Hypertension Maternal Grandmother   . Heart disease Maternal Grandmother   . Hearing loss Neg Hx    History  Substance Use Topics  . Smoking status: Current Some Day Smoker -- 7 years    Types: Cigarettes  . Smokeless tobacco: Never Used  . Alcohol Use: No     Comment: occasionally   OB History    Gravida Para Term Preterm AB TAB SAB Ectopic Multiple Living   1              Review of Systems  Constitutional: Negative for fever, chills, diaphoresis, appetite change and fatigue.  HENT: Negative for mouth sores, sore throat and trouble swallowing.   Eyes: Negative for visual disturbance.  Respiratory: Negative for cough, chest tightness, shortness of breath and wheezing.   Cardiovascular: Negative for chest pain.  Gastrointestinal: Negative for nausea, vomiting, abdominal pain, diarrhea and abdominal distention.  Endocrine: Negative for polydipsia, polyphagia and polyuria.  Genitourinary: Negative for dysuria,  frequency and hematuria.  Musculoskeletal: Negative for gait problem.       Og bites to the dorsum of the left distal forearm. No bites volar.  Skin: Negative for color change, pallor and rash.  Neurological: Negative for dizziness, syncope, light-headedness and headaches.  Hematological: Does not bruise/bleed easily.  Psychiatric/Behavioral: Negative for behavioral problems and confusion.      Allergies  Review of patient's allergies indicates no known allergies.  Home Medications   Prior to Admission medications   Medication Sig Start Date End Date Taking? Authorizing Provider  amoxicillin-clavulanate (AUGMENTIN) 875-125 MG per tablet Take 1 tablet by mouth 2 (two) times daily. 07/28/14   Rolland Porter, MD   BP 128/91 mmHg  Pulse 70  Temp(Src) 98.9 F (37.2 C) (Oral)  Resp 18  SpO2 100% Physical Exam  Constitutional: She is oriented to person, place, and time. She appears well-developed and well-nourished. No distress.  HENT:  Head: Normocephalic.  Eyes: Conjunctivae are normal. Pupils are equal, round, and reactive to light. No scleral icterus.  Neck: Normal range of motion. Neck supple. No thyromegaly present.  Cardiovascular: Normal rate and regular rhythm.  Exam reveals no gallop and no friction rub.   No murmur heard. Pulmonary/Chest: Effort normal and breath sounds normal. No respiratory distress. She has no wheezes.  She has no rales.  Abdominal: Soft. Bowel sounds are normal. She exhibits no distension. There is no tenderness. There is no rebound.  Musculoskeletal: Normal range of motion.       Hands: Neurological: She is alert and oriented to person, place, and time.  Skin: Skin is warm and dry. No rash noted.  Psychiatric: She has a normal mood and affect. Her behavior is normal.    ED Course  Procedures (including critical care time) Labs Review Labs Reviewed - No data to display  Imaging Review No results found.   EKG Interpretation None      MDM    Final diagnoses:  Dog bite    She describes some paresthesias along her ulnar 3 fingers. She has normal intrinsic function and normal use and movement. Her wounds are all dorsal. No sign that the dog health purchase volar and dorsal. Doubt ulnar nerve laceration, doubt ulnar artery laceration. Normal Allen's test. Plan is antibiotics prophylaxis. Wound care given here,  recheck with any discoloration or further numbness weakness of the fingers or any failure to improve. Up-to-date on her tetanus. Doubt rabies as a Pharmacist, community.    Rolland Porter, MD 07/28/14 1029

## 2014-07-28 NOTE — ED Notes (Signed)
Pt states that she was breaking up a fight between her dog and a strange dog.  States that the strange dog bit her in the lt forearm.  Pt now states that she cannot feel her fingers.  Pt able to move all fingers distal to injury.

## 2014-10-01 IMAGING — US US OB TRANSVAGINAL
1 series · 14 of 28 positions shown · non-contrast
Comparison: None.

CLINICAL DATA: Early pregnancy with left lower quadrant pain

EXAM:
OBSTETRIC <14 WK US AND TRANSVAGINAL OB US
TECHNIQUE: Both transabdominal and transvaginal ultrasound examinations were
performed for complete evaluation of the gestation as well as the
maternal uterus, adnexal regions, and pelvic cul-de-sac.
Transvaginal technique was performed to assess early pregnancy.

[Series 1: us ob comp less 14 wks · 42 acquisitions, 14 frames shown]
[im 2/42]
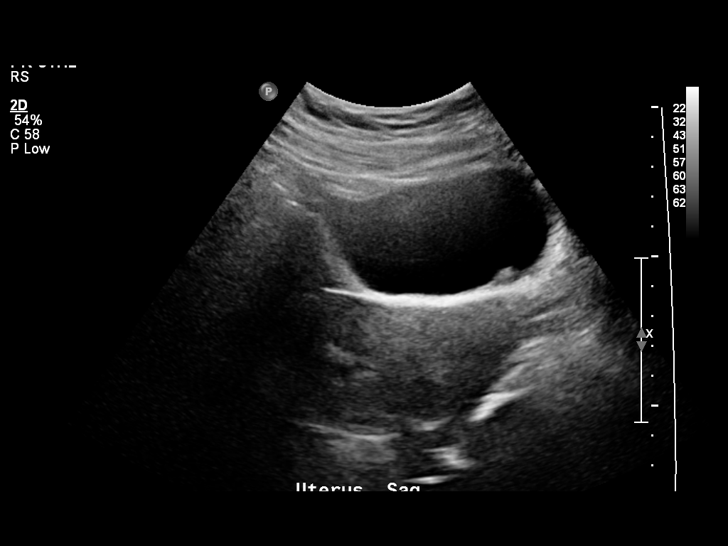
[im 5/42]
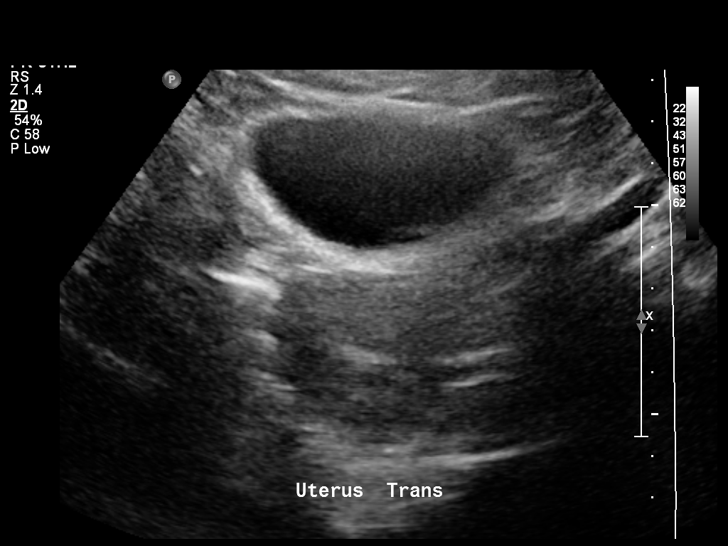
[im 8/42]
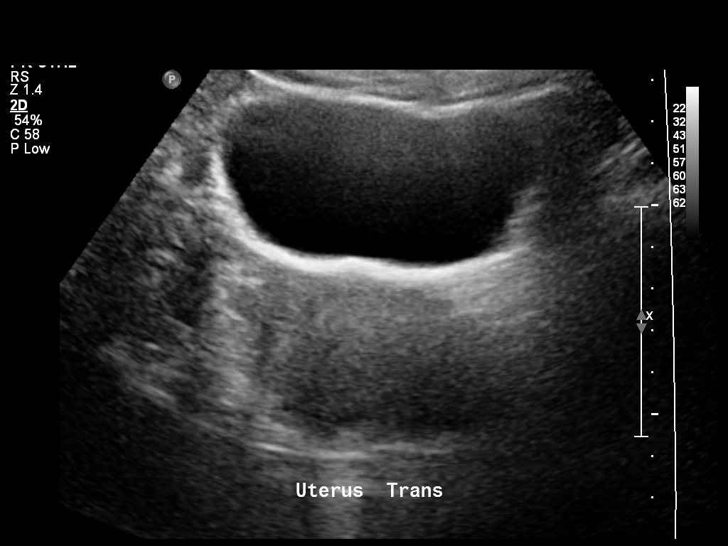
[im 11/42]
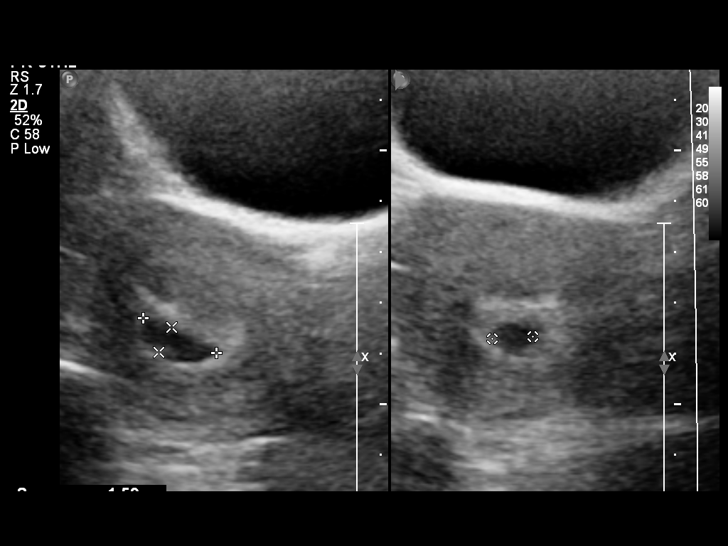
[im 14/42]
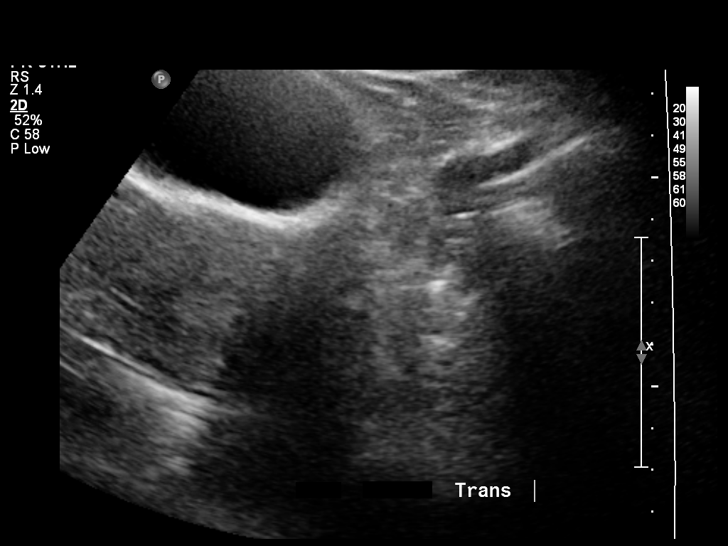
[im 17/42]
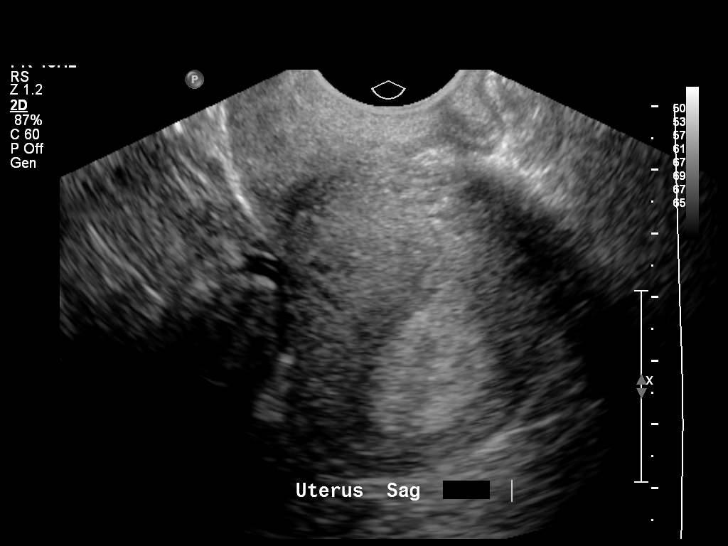
[im 20/42]
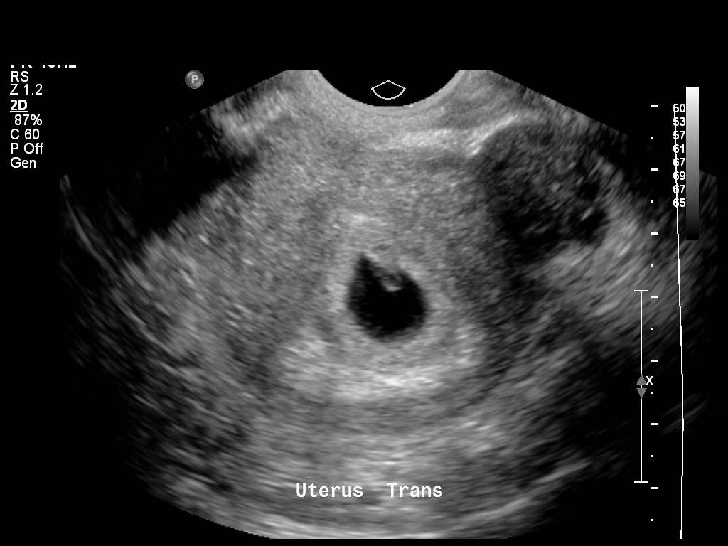
[im 23/42]
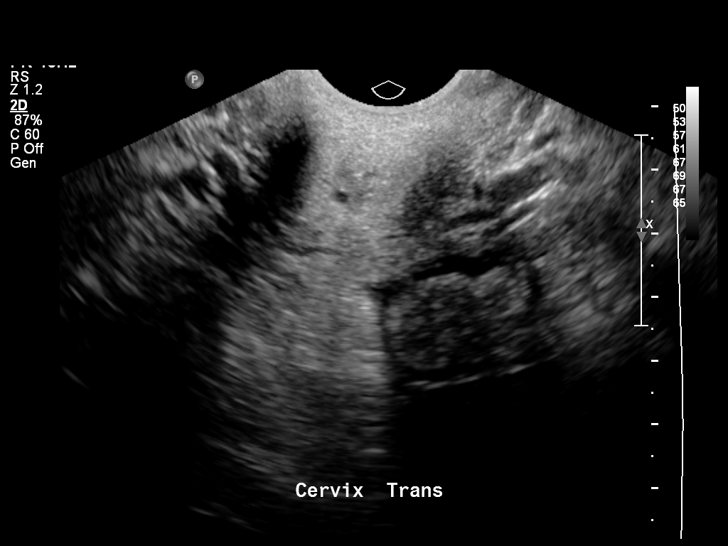
[im 26/42]
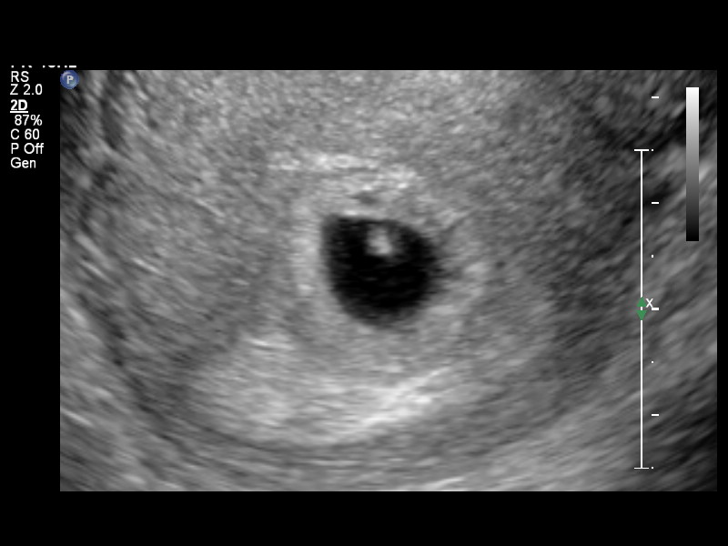
[im 29/42]
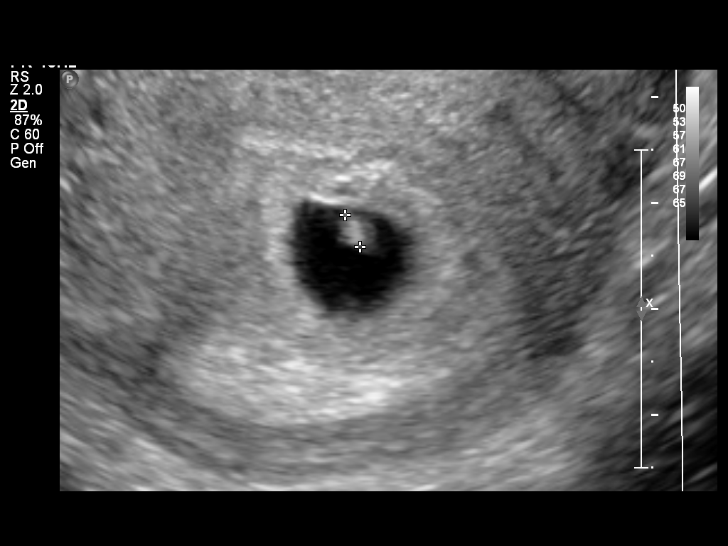
[im 32/42]
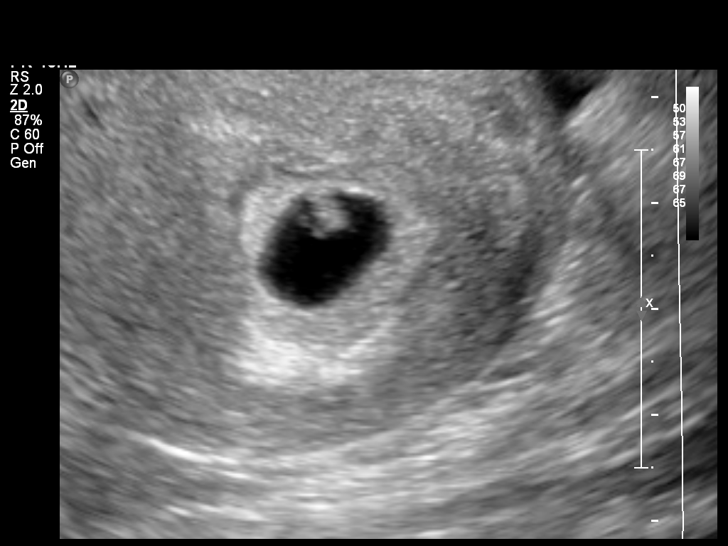
[im 35/42]
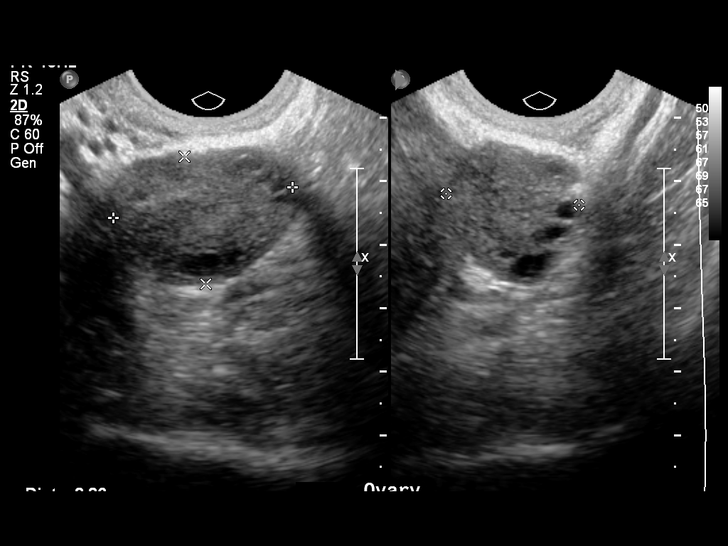
[im 38/42]
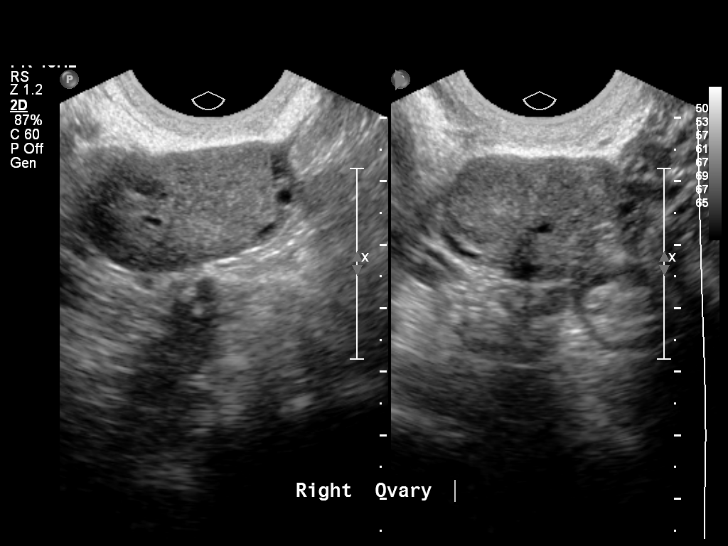
[im 42/42]
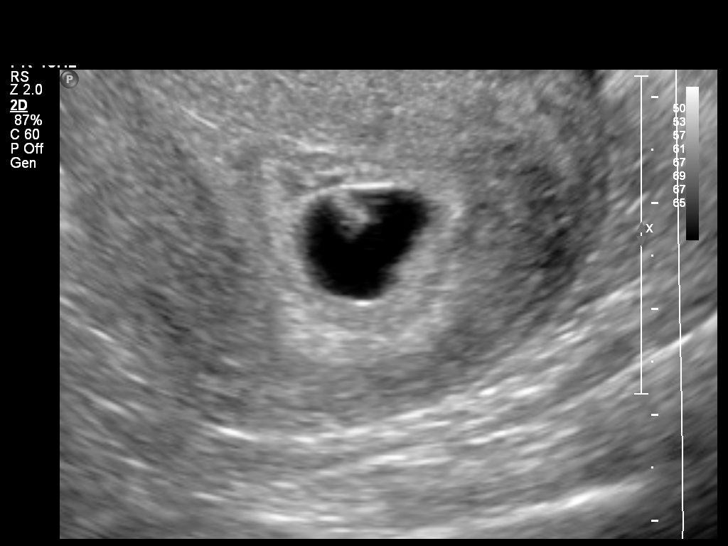

[14 of 28 positions shown; findings below may reference images not displayed]

FINDINGS: Intrauterine gestational sac: Visualized/normal in shape.

Yolk sac:  Present

Embryo:  Present

Cardiac Activity: Present

Heart Rate:  104 bpm

CRL:   3.6  mm   6 w 1 d                  US EDC: 02/25/2014

Maternal uterus/adnexae: No subchorionic hemorrhage. The ovaries are
symmetric and normal in appearance. No adnexal mass. No free pelvic
fluid.
IMPRESSION: Single, living intrauterine gestation - sonographic age 6 weeks 1
day. No findings to explain left lower quadrant pain.

## 2015-01-06 NOTE — L&D Delivery Note (Signed)
Delivery Note At 1:21 AM a viable female was delivered via Vaginal, Spontaneous Delivery (Presentation: vertex; OA).  APGAR: 8, 9; weight pending.   Placenta status: spontaneous, intact.  Cord: 3 vessel with the following complications: wrapped around body, delivered through.  Cord pH: not obtained  Anesthesia:   Episiotomy: None Lacerations:  1st degree perineal, not requiring repair Suture Repair: n/a Est. Blood Loss (mL):  300  Mom to postpartum.  Baby to Couplet care / Skin to Skin.  Jinny BlossomKaty D Mayo 10/19/2015, 1:36 AM  Patient is a G2P0010 at 738w6d who was admitted for IOL of pre-e, uncomplicated prenatal course.  She progressed with augmentation via cytotec, Pit and AROM.  I was gloved and present for delivery in its entirety.  Second stage of labor progressed to SVD.  No decels during second stage noted.  Complications: none  Lacerations: 1st degree perineal  EBL: 300  Quamere Mussell, CNM 2:08 AM  10/19/2015

## 2015-03-04 ENCOUNTER — Inpatient Hospital Stay (HOSPITAL_COMMUNITY)
Admission: AD | Admit: 2015-03-04 | Discharge: 2015-03-04 | Disposition: A | Discharge status: Home / Self Care | Payer: Medicaid Other | Source: Ambulatory Visit | Attending: Family Medicine | Admitting: Family Medicine

## 2015-03-04 ENCOUNTER — Encounter (HOSPITAL_COMMUNITY): Payer: Self-pay | Admitting: *Deleted

## 2015-03-04 ENCOUNTER — Inpatient Hospital Stay (HOSPITAL_COMMUNITY): Payer: Medicaid Other

## 2015-03-04 DIAGNOSIS — R109 Unspecified abdominal pain: Secondary | ICD-10-CM | POA: Insufficient documentation

## 2015-03-04 DIAGNOSIS — O99331 Smoking (tobacco) complicating pregnancy, first trimester: Secondary | ICD-10-CM | POA: Insufficient documentation

## 2015-03-04 DIAGNOSIS — D72829 Elevated white blood cell count, unspecified: Secondary | ICD-10-CM | POA: Insufficient documentation

## 2015-03-04 DIAGNOSIS — E860 Dehydration: Secondary | ICD-10-CM | POA: Diagnosis not present

## 2015-03-04 DIAGNOSIS — K219 Gastro-esophageal reflux disease without esophagitis: Secondary | ICD-10-CM | POA: Insufficient documentation

## 2015-03-04 DIAGNOSIS — O219 Vomiting of pregnancy, unspecified: Secondary | ICD-10-CM

## 2015-03-04 DIAGNOSIS — O210 Mild hyperemesis gravidarum: Secondary | ICD-10-CM

## 2015-03-04 DIAGNOSIS — J45909 Unspecified asthma, uncomplicated: Secondary | ICD-10-CM | POA: Insufficient documentation

## 2015-03-04 DIAGNOSIS — O26891 Other specified pregnancy related conditions, first trimester: Secondary | ICD-10-CM | POA: Insufficient documentation

## 2015-03-04 DIAGNOSIS — Z3A01 Less than 8 weeks gestation of pregnancy: Secondary | ICD-10-CM | POA: Insufficient documentation

## 2015-03-04 DIAGNOSIS — R030 Elevated blood-pressure reading, without diagnosis of hypertension: Secondary | ICD-10-CM | POA: Insufficient documentation

## 2015-03-04 DIAGNOSIS — O26899 Other specified pregnancy related conditions, unspecified trimester: Secondary | ICD-10-CM

## 2015-03-04 DIAGNOSIS — IMO0001 Reserved for inherently not codable concepts without codable children: Secondary | ICD-10-CM

## 2015-03-04 LAB — COMPREHENSIVE METABOLIC PANEL
ALT: 32 U/L (ref 14–54)
AST: 24 U/L (ref 15–41)
Albumin: 4.3 g/dL (ref 3.5–5.0)
Alkaline Phosphatase: 66 U/L (ref 38–126)
Anion gap: 10 (ref 5–15)
BUN: 10 mg/dL (ref 6–20)
CO2: 29 mmol/L (ref 22–32)
Calcium: 9 mg/dL (ref 8.9–10.3)
Chloride: 92 mmol/L — ABNORMAL LOW (ref 101–111)
Creatinine, Ser: 0.64 mg/dL (ref 0.44–1.00)
GFR calc Af Amer: >60 mL/min (ref >60)
GFR calc non Af Amer: >60 mL/min (ref >60)
Glucose, Bld: 122 mg/dL — ABNORMAL HIGH (ref 65–99)
Potassium: 2.8 mmol/L — ABNORMAL LOW (ref 3.5–5.1)
Sodium: 131 mmol/L — ABNORMAL LOW (ref 135–145)
Total Bilirubin: 1 mg/dL (ref 0.3–1.2)
Total Protein: 7.2 g/dL (ref 6.5–8.1)

## 2015-03-04 LAB — CBC
HCT: 39.2 % (ref 36.0–46.0)
Hemoglobin: 14 g/dL (ref 12.0–15.0)
MCH: 31.5 pg (ref 26.0–34.0)
MCHC: 35.7 g/dL (ref 30.0–36.0)
MCV: 88.1 fL (ref 78.0–100.0)
Platelets: 303 10*3/uL (ref 150–400)
RBC: 4.45 MIL/uL (ref 3.87–5.11)
RDW: 12.4 % (ref 11.5–15.5)
WBC: 19.1 10*3/uL — ABNORMAL HIGH (ref 4.0–10.5)

## 2015-03-04 LAB — URINALYSIS, ROUTINE W REFLEX MICROSCOPIC
Bilirubin Urine: NEGATIVE
Glucose, UA: NEGATIVE mg/dL
Ketones, ur: >80 mg/dL — AB
Leukocytes, UA: NEGATIVE
Nitrite: NEGATIVE
Protein, ur: 30 mg/dL — AB
Specific Gravity, Urine: 1.01 (ref 1.005–1.030)
pH: >9 — ABNORMAL HIGH (ref 5.0–8.0)

## 2015-03-04 LAB — WET PREP, GENITAL
Clue Cells Wet Prep HPF POC: NONE SEEN
Sperm: NONE SEEN
Trich, Wet Prep: NONE SEEN
Yeast Wet Prep HPF POC: NONE SEEN

## 2015-03-04 LAB — LIPASE, BLOOD: Lipase: 16 U/L (ref 11–51)

## 2015-03-04 LAB — POCT PREGNANCY, URINE: Preg Test, Ur: POSITIVE — AB

## 2015-03-04 LAB — HCG, QUANTITATIVE, PREGNANCY: hCG, Beta Chain, Quant, S: 116134 m[IU]/mL — ABNORMAL HIGH (ref <5)

## 2015-03-04 LAB — URINE MICROSCOPIC-ADD ON

## 2015-03-04 MED ORDER — LACTATED RINGERS IV BOLUS (SEPSIS)
1000.0000 mL | Freq: Once | INTRAVENOUS | Status: AC
  Administered 2015-03-04: 1000 mL via INTRAVENOUS

## 2015-03-04 MED ORDER — RANITIDINE HCL 150 MG PO CAPS
150.0000 mg | ORAL_CAPSULE | Freq: Two times a day (BID) | ORAL | Status: DC
Start: 2015-03-04 — End: 2015-03-04

## 2015-03-04 MED ORDER — POTASSIUM CHLORIDE 2 MEQ/ML IV SOLN
Freq: Once | INTRAVENOUS | Status: AC
  Administered 2015-03-04: 13:00:00 via INTRAVENOUS
  Filled 2015-03-04: qty 1000

## 2015-03-04 MED ORDER — METOCLOPRAMIDE HCL 10 MG PO TABS: 10.0000 mg | ORAL_TABLET | Freq: Three times a day (TID) | ORAL | Status: DC

## 2015-03-04 MED ORDER — METOCLOPRAMIDE HCL 5 MG/ML IJ SOLN
10.0000 mg | Freq: Once | INTRAMUSCULAR | Status: AC
  Administered 2015-03-04: 10 mg via INTRAVENOUS
  Filled 2015-03-04: qty 2

## 2015-03-04 MED ORDER — FAMOTIDINE IN NACL 20-0.9 MG/50ML-% IV SOLN
20.0000 mg | Freq: Once | INTRAVENOUS | Status: AC
  Administered 2015-03-04: 20 mg via INTRAVENOUS
  Filled 2015-03-04: qty 50

## 2015-03-04 MED ORDER — RANITIDINE HCL 150 MG PO CAPS: 150.0000 mg | ORAL_CAPSULE | Freq: Two times a day (BID) | ORAL | Status: DC

## 2015-03-04 MED ORDER — DEXTROSE 5 % IN LACTATED RINGERS IV BOLUS
1000.0000 mL | Freq: Once | INTRAVENOUS | Status: AC
  Administered 2015-03-04: 1000 mL via INTRAVENOUS

## 2015-03-04 NOTE — MAU Note (Signed)
Pt states states she started throwing up Friday.  She states she was sent home with nausea medication and it has not helped.  Pt states her face sometimes goes red and her hands and lips go numb.

## 2015-03-04 NOTE — MAU Provider Note (Signed)
History     CSN: [id number]  Arrival date and time: 03/04/15 4627   First Provider Initiated Contact with Patient 03/04/15 [phone]      Chief Complaint  Patient presents with  . Emesis   HPI   Ms.Bonnie Shepard is 30 y.o. female G2P0010 at 32w5dpresenting to MAU with nausea and vomiting. She found out on Friday that she was pregnant; she went to another ED that day with the same symptoms. She was given benadryl for nausea and does not feel like this is helping. She is vomiting every hour.   Denies vaginal bleeding   Denies history of hypertension    OB History    Gravida Para Term Preterm AB TAB SAB Ectopic Multiple Living   2    1           Past Medical History  Diagnosis Date  . Acid reflux   . Asthma   . Infection     freq UTI  . Vaginal Pap smear, abnormal     f/u wnl    Past Surgical History  Procedure Laterality Date  . Tonsillectomy    . Dilation and evacuation N/A 09/07/2013    Procedure: DILATATION AND EVACUATION;  Surgeon: KFarrel Gobble RHarrington Challenger MD;  Location: [facility];  Service: Gynecology;  Laterality: N/A;    Family History  Problem Relation Age of Onset  . Hypertension Other   . Cancer Other   . Hypertension Mother   . Hypertension Father   . Hypertension Maternal Grandmother   . Heart disease Maternal Grandmother   . Hearing loss Neg Hx     Social History  Substance Use Topics  . Smoking status: Current Some Day Smoker -- 7 years    Types: Cigarettes  . Smokeless tobacco: Never Used  . Alcohol Use: No     Comment: occasionally    Allergies: No Known Allergies  Prescriptions prior to admission  Medication Sig Dispense Refill Last Dose  . amoxicillin-clavulanate (AUGMENTIN) 875-125 MG per tablet Take 1 tablet by mouth 2 (two) times daily. 14 tablet 0    Results for orders placed or performed during the hospital encounter of 03/04/15 (from the past 48 hour(s))  Pregnancy, urine POC     Status: Abnormal   Collection Time: 03/04/15  9:19 AM  Result  Value Ref Range   Preg Test, Ur POSITIVE (A) NEGATIVE    Comment:        THE SENSITIVITY OF THIS METHODOLOGY IS >24 mIU/mL   [facility]inal[facility], [facility]     Status: Abnormal   Collection Time: 03/04/15  9:21 AM  Result Value Ref Range   Color, Urine YELLOW YELLOW   APPearance CLEAR CLEAR   Specific Gravity, Urine 1.010 1.005 - 1.030   pH >9.0 (H) 5.0 - 8.0   Glucose, UA NEGATIVE NEGATIVE mg/dL   Hgb urine dipstick SMALL (A) NEGATIVE   Bilirubin Urine NEGATIVE NEGATIVE   Ketones, ur >80 (A) NEGATIVE mg/dL   Protein, ur 30 (A) NEGATIVE mg/dL   Nitrite NEGATIVE NEGATIVE   Leukocytes, UA NEGATIVE NEGATIVE  Urine microscopic-add on     Status: Abnormal   Collection Time: 03/04/15  9:21 AM  Result Value Ref Range   Squamous Epithelial / LPF 0-5 (A) NONE SEEN   WBC, UA 0-5 0 - 5 WBC/hpf   RBC / HPF 0-5 0 - 5 RBC/hpf   Bacteria, UA RARE (A) NONE SEEN  Wet prep, genital  Status: Abnormal   Collection Time: 03/04/15  9:35 AM  Result Value Ref Range   Yeast Wet Prep HPF POC NONE SEEN NONE SEEN   Trich, Wet Prep NONE SEEN NONE SEEN   Clue Cells Wet Prep HPF POC NONE SEEN NONE SEEN   WBC, Wet Prep HPF POC FEW (A) NONE SEEN    Comment: FEW BACTERIA SEEN   Sperm NONE SEEN   CBC     Status: Abnormal   Collection Time: 03/04/15  9:39 AM  Result Value Ref Range   WBC 19.1 (H) 4.0 - 10.5 K/uL   RBC 4.45 3.87 - 5.11 MIL/uL   Hemoglobin 14.0 12.0 - 15.0 g/dL   HCT 39.2 36.0 - 46.0 %   MCV 88.1 78.0 - 100.0 fL   MCH 31.5 26.0 - 34.0 pg   MCHC 35.7 30.0 - 36.0 g/dL   RDW 12.4 11.5 - 15.5 %   Platelets 303 150 - 400 K/uL  hCG, quantitative, pregnancy     Status: Abnormal   Collection Time: 03/04/15  9:39 AM  Result Value Ref Range   hCG, Beta Chain, Quant, S 732202 (H) <5 mIU/mL    Comment:          GEST. AGE      CONC.  (mIU/mL)   <=1 WEEK        5 - 50     2 WEEKS       50 - 500     3 WEEKS       100 - 10,000     4 WEEKS     1,000 - 30,000     5 WEEKS      3,500 - 115,000   6-8 WEEKS     12,000 - 270,000    12 WEEKS     15,000 - 220,000        FEMALE AND NON-PREGNANT FEMALE:     LESS THAN 5 mIU/mL   Comprehensive metabolic panel     Status: Abnormal   Collection Time: 03/04/15  9:39 AM  Result Value Ref Range   Sodium 131 (L) 135 - 145 mmol/L   Potassium 2.8 (L) 3.5 - 5.1 mmol/L   Chloride 92 (L) 101 - 111 mmol/L   CO2 29 22 - 32 mmol/L   Glucose, Bld 122 (H) 65 - 99 mg/dL   BUN 10 6 - 20 mg/dL   Creatinine, Ser 0.64 0.44 - 1.00 mg/dL   Calcium 9.0 8.9 - 10.3 mg/dL   Total Protein 7.2 6.5 - 8.1 g/dL   Albumin 4.3 3.5 - 5.0 g/dL   AST 24 15 - 41 U/L   ALT 32 14 - 54 U/L   Alkaline Phosphatase 66 38 - 126 U/L   Total Bilirubin 1.0 0.3 - 1.2 mg/dL   GFR calc non Af Amer >60 >60 mL/min   GFR calc Af Amer >60 >60 mL/min    Comment: (NOTE) The eGFR has been calculated using the CKD EPI equation. This calculation has not been validated in all clinical situations. eGFR's persistently <60 mL/min signify possible Chronic Kidney Disease.    Anion gap 10 5 - 15  Lipase, blood     Status: None   Collection Time: 03/04/15  9:44 AM  Result Value Ref Range   Lipase 16 11 - 51 U/L   US Ob Comp Less 14 Wks  03/04/2015  CLINICAL DATA:  Abdominal pain in pregnancy. EXAM: OBSTETRIC <14 WK [place] AND TRANSVAGINAL OB US TECHNIQUE:  Both transabdominal and transvaginal ultrasound examinations were performed for complete evaluation of the gestation as well as the maternal uterus, adnexal regions, and pelvic cul-de-sac. Transvaginal technique was performed to assess early pregnancy. COMPARISON:  None FINDINGS: Intrauterine gestational sac: Single Yolk sac:  Yes Embryo:  Yes Cardiac Activity: Yes Heart Rate: 128  bpm MSD:   mm    w     d CRL:  9.8  mm   7 w   1 d                  [place] EDC: 10-20-15 Subchorionic hemorrhage:  None visualized. Maternal uterus/adnexae: Subchorionic hemorrhage: None Right ovary: Normal Left ovary: Normal Other :None Free fluid:   Trace IMPRESSION: Single living intrauterine gestation. The estimated gestational age is 2 weeks and 1 day. Electronically Signed   By: Kerby Moors M.D.   On: 03/04/2015 11:30   US Ob Transvaginal  03/04/2015  CLINICAL DATA:  Abdominal pain in pregnancy. EXAM: OBSTETRIC <14 WK [place] AND TRANSVAGINAL OB US TECHNIQUE: Both transabdominal and transvaginal ultrasound examinations were performed for complete evaluation of the gestation as well as the maternal uterus, adnexal regions, and pelvic cul-de-sac. Transvaginal technique was performed to assess early pregnancy. COMPARISON:  None FINDINGS: Intrauterine gestational sac: Single Yolk sac:  Yes Embryo:  Yes Cardiac Activity: Yes Heart Rate: 128  bpm MSD:   mm    w     d CRL:  9.8  mm   7 w   1 d                  [place] EDC: 10-20-15 Subchorionic hemorrhage:  None visualized. Maternal uterus/adnexae: Subchorionic hemorrhage: None Right ovary: Normal Left ovary: Normal Other :None Free fluid:  Trace IMPRESSION: Single living intrauterine gestation. The estimated gestational age is 14 weeks and 1 day. Electronically Signed   By: Kerby Moors M.D.   On: 03/04/2015 11:30    Review of Systems  Constitutional: Negative for fever and chills.  Gastrointestinal: Positive for nausea, vomiting and abdominal pain (Bilaterl lower abdominal pain ).  Neurological: Negative for dizziness.   Physical Exam   Blood pressure 149/78, pulse 56, temperature 98.7 F (37.1 C), temperature source Oral, resp. rate 16, height '5\' 3"'$  (1.6 m), weight 198 lb 3.2 oz (89.903 kg), last menstrual period 01/02/2015, unknown if currently breastfeeding.  Physical Exam  Constitutional: She is oriented to person, place, and time. She appears well-developed and well-nourished. No distress.  GI: Soft. Normal appearance. There is tenderness in the right lower quadrant, suprapubic area and left lower quadrant. There is no rigidity, no rebound and no guarding.  Genitourinary:  Speculum  exam: Vagina - Small amount of creamy discharge, no odor Cervix - No contact bleeding Bimanual exam: Cervix closed Uterus non tender, gravid  Adnexa non tender, no masses bilaterally GC/Chlam, wet prep done Chaperone present for exam.  Musculoskeletal: Normal range of motion.  Neurological: She is alert and oriented to person, place, and time.  Skin: Skin is warm. She is not diaphoretic.  Psychiatric: Her behavior is normal.    MAU Course  Procedures  None  MDM  LR  Reglan pepcid   K level 2.8: potassium IV 20 meq given over 2 hours.  Care everywhere reviewed: WBC 15: 2/24 Lipase 674 Repeat Lipase level today> normal today Discussed labs and physical exam with Dr. Nehemiah Settle.   Patient currently rates her pain 0/10 Previous CBC's show leukocytosis   Assessment and Plan  A:  1. Leukocytosis   2. Abdominal pain in pregnancy, antepartum   3. Dehydration   4. Hyperemesis gravidarum   5. Elevated BP     P:  Discharge home in stable condition RX: Reglan, Zantac  Increase PO fluid intake Small, frequent meals Referral sent to the [facility] for prenatal care Over the counter potassium as directed on the bottle .     Lezlie Lye, NP 03/04/2015 3:39 PM

## 2015-03-04 NOTE — Discharge Instructions (Signed)
Food Choices for Gastroesophageal Reflux Disease, Adult When you have gastroesophageal reflux disease (GERD), the foods you eat and your eating habits are very important. Choosing the right foods can help ease the discomfort of GERD. WHAT GENERAL GUIDELINES DO I NEED TO FOLLOW?  Choose fruits, vegetables, whole grains, low-fat dairy products, and low-fat meat, fish, and poultry.  Limit fats such as oils, salad dressings, butter, nuts, and avocado.  Keep a food diary to identify foods that cause symptoms.  Avoid foods that cause reflux. These may be different for different people.  Eat frequent small meals instead of three large meals each day.  Eat your meals slowly, in a relaxed setting.  Limit fried foods.  Cook foods using methods other than frying.  Avoid drinking alcohol.  Avoid drinking large amounts of liquids with your meals.  Avoid bending over or lying down until 2-3 hours after eating. WHAT FOODS ARE NOT RECOMMENDED? The following are some foods and drinks that may worsen your symptoms: Vegetables Tomatoes. Tomato juice. Tomato and spaghetti sauce. Chili peppers. Onion and garlic. Horseradish. Fruits Oranges, grapefruit, and lemon (fruit and juice). Meats High-fat meats, fish, and poultry. This includes hot dogs, ribs, ham, sausage, salami, and bacon. Dairy Whole milk and chocolate milk. Sour cream. Cream. Butter. Ice cream. Cream cheese.  Beverages Coffee and tea, with or without caffeine. Carbonated beverages or energy drinks. Condiments Hot sauce. Barbecue sauce.  Sweets/Desserts Chocolate and cocoa. Donuts. Peppermint and spearmint. Fats and Oils High-fat foods, including [place] fries and potato chips. Other Vinegar. Strong spices, such as black pepper, [place] pepper, red pepper, cayenne, curry powder, cloves, ginger, and chili powder. The items listed above may not be a complete list of foods and beverages to avoid. Contact your dietitian for more  information.   This information is not intended to replace advice given to you by your health care provider. Make sure you discuss any questions you have with your health care provider.   Document Released: 12/22/2004 Document Revised: 01/12/2014 Document Reviewed: 10/26/2012 Elsevier Interactive Patient Education 2016 [employer].  Eating Plan for Hyperemesis Gravidarum Severe cases of hyperemesis gravidarum can lead to dehydration and malnutrition. The hyperemesis eating plan is one way to lessen the symptoms of nausea and vomiting. It is often used with prescribed medicines to control your symptoms.  WHAT CAN I DO TO RELIEVE MY SYMPTOMS? Listen to your body. Everyone is different and has different preferences. Find what works best for you. Some of the following things may help:  Eat and drink slowly.  Eat 5-6 small meals daily instead of 3 large meals.   Eat crackers before you get out of bed in the morning.   Starchy foods are usually well tolerated (such as cereal, toast, bread, potatoes, pasta, rice, and pretzels).   Ginger may help with nausea. Add  tsp ground ginger to hot tea or choose ginger tea.   Try drinking 100% fruit juice or an electrolyte drink.  Continue to take your prenatal vitamins as directed by your health care provider. If you are having trouble taking your prenatal vitamins, talk with your health care provider about different options.  Include at least 1 serving of protein with your meals and snacks (such as meats or poultry, beans, nuts, eggs, or yogurt). Try eating a protein-rich snack before bed (such as cheese and crackers or a half [place] or peanut butter sandwich). WHAT THINGS SHOULD I AVOID TO REDUCE MY SYMPTOMS? The following things may help reduce your symptoms:  Avoid foods with strong smells. Try eating meals in well-ventilated areas that are free of odors.  Avoid drinking water or other beverages with meals. Try not to drink anything less  than 30 minutes before and after meals.  Avoid drinking more than 1 cup of fluid at a time.  Avoid fried or high-fat foods, such as butter and cream sauces.  Avoid spicy foods.  Avoid skipping meals the best you can. Nausea can be more intense on an empty stomach. If you cannot tolerate food at that time, do not force it. Try sucking on ice chips or other frozen items and make up the calories later.  Avoid lying down within 2 hours after eating.   This information is not intended to replace advice given to you by your health care provider. Make sure you discuss any questions you have with your health care provider.   Document Released: 10/19/2006 Document Revised: 12/27/2012 Document Reviewed: 10/26/2012 Elsevier Interactive Patient Education [employer]

## 2015-03-05 LAB — GC/CHLAMYDIA PROBE AMP (~~LOC~~) NOT AT ARMC
Chlamydia: NEGATIVE
Neisseria Gonorrhea: NEGATIVE

## 2015-03-05 LAB — HIV ANTIBODY (ROUTINE TESTING W REFLEX): HIV Screen 4th Generation wRfx: NONREACTIVE

## 2015-04-09 ENCOUNTER — Encounter: Payer: Self-pay | Admitting: Family

## 2015-04-09 ENCOUNTER — Ambulatory Visit (INDEPENDENT_AMBULATORY_CARE_PROVIDER_SITE_OTHER): Payer: Medicaid Other | Admitting: Family

## 2015-04-09 VITALS — BP 120/77 | HR 67 | Wt 207.3 lb

## 2015-04-09 DIAGNOSIS — O09291 Supervision of pregnancy with other poor reproductive or obstetric history, first trimester: Secondary | ICD-10-CM

## 2015-04-09 DIAGNOSIS — Z36 Encounter for antenatal screening of mother: Secondary | ICD-10-CM | POA: Diagnosis not present

## 2015-04-09 DIAGNOSIS — Z124 Encounter for screening for malignant neoplasm of cervix: Secondary | ICD-10-CM

## 2015-04-09 DIAGNOSIS — O0990 Supervision of high risk pregnancy, unspecified, unspecified trimester: Secondary | ICD-10-CM | POA: Insufficient documentation

## 2015-04-09 DIAGNOSIS — Z3481 Encounter for supervision of other normal pregnancy, first trimester: Secondary | ICD-10-CM

## 2015-04-09 DIAGNOSIS — O3680X1 Pregnancy with inconclusive fetal viability, fetus 1: Secondary | ICD-10-CM

## 2015-04-09 DIAGNOSIS — O09891 Supervision of other high risk pregnancies, first trimester: Secondary | ICD-10-CM

## 2015-04-09 DIAGNOSIS — O09292 Supervision of pregnancy with other poor reproductive or obstetric history, second trimester: Secondary | ICD-10-CM | POA: Insufficient documentation

## 2015-04-09 LAB — POCT URINALYSIS DIP (DEVICE)
Bilirubin Urine: NEGATIVE
Glucose, UA: NEGATIVE mg/dL
Ketones, ur: NEGATIVE mg/dL
Leukocytes, UA: NEGATIVE
Nitrite: NEGATIVE
Protein, ur: NEGATIVE mg/dL
Specific Gravity, Urine: >=1.03 (ref 1.005–1.030)
Urobilinogen, UA: 0.2 mg/dL (ref 0.0–1.0)
pH: 6 (ref 5.0–8.0)

## 2015-04-09 NOTE — Progress Notes (Signed)
Pt thinks that she has hsv but no current outbreaks. Never tested for it.  Pt declines flu shot.

## 2015-04-09 NOTE — Patient Instructions (Signed)

## 2015-04-09 NOTE — Progress Notes (Signed)
Subjective:    Bonnie Shepard is a G2P0010 4611w2d being seen today for her first obstetrical visit.  Her obstetrical history is significant for obesity and history of 2nd trmester loss. Patient uncertain regarding intending to breast feed. Pregnancy history fully reviewed.  Patient reports nausea, no bleeding and vomiting.  Nausea and vomiting improves with reglan.    Filed Vitals:   04/09/15 0839  BP: 120/77  Pulse: 67  Weight: 207 lb 4.8 oz (94.031 kg)    HISTORY: OB History  Gravida Para Term Preterm AB SAB TAB Ectopic Multiple Living  2    1         # Outcome Date GA Lbr Len/2nd Weight Sex Delivery Anes PTL Lv  2 Current           1 AB  3414w0d    SAB        Past Medical History  Diagnosis Date  . Asthma   . Vaginal Pap smear, abnormal     f/u wnl  . Infection     freq UTI  . Acid reflux    Past Surgical History  Procedure Laterality Date  . Dilation and evacuation N/A 09/07/2013    Procedure: DILATATION AND EVACUATION;  Surgeon: Freddrick MarchKendra H. Tenny Crawoss, MD;  Location: [facility];  Service: Gynecology;  Laterality: N/A;  . Tonsillectomy     Family History  Problem Relation Age of Onset  . Hypertension Other   . Cancer Other   . Hypertension Mother   . Hypertension Father   . Hypertension Maternal Grandmother   . Heart disease Maternal Grandmother   . Hearing loss Neg Hx      Exam    Uterus:  Fundal Height: 14 cm   BP 120/77 mmHg  Pulse 67  Wt 207 lb 4.8 oz (94.031 kg)  LMP 01/02/2015 Uterine Size: size equals dates  Pelvic Exam:    Perineum: No Hemorrhoids, Normal Perineum   Vulva: normal   Vagina:  normal mucosa, normal discharge, no palpable nodules   pH: Not done   Cervix: no bleeding following Pap, no cervical motion tenderness and no lesions   Adnexa: normal adnexa and no mass, fullness, tenderness   Bony Pelvis: Adequate  System: Breast:  No nipple retraction or dimpling, No nipple discharge or bleeding, No axillary or supraclavicular adenopathy, Normal to  palpation without dominant masses   Skin: normal coloration and turgor, no rashes    Neurologic: negative   Extremities: normal strength, tone, and muscle mass   HEENT neck supple with midline trachea and thyroid without masses   Mouth/Teeth mucous membranes moist, pharynx normal without lesions   Neck supple and no masses   Cardiovascular: regular rate and rhythm, no murmurs or gallops   Respiratory:  appears well, vitals normal, no respiratory distress, acyanotic, normal RR, neck free of mass or lymphadenopathy, chest clear, no wheezing, crepitations, rhonchi, normal symmetric air entry   Abdomen: soft, non-tender; bowel sounds normal; no masses,  no organomegaly   Urinary: urethral meatus normal      Assessment:    Pregnancy: G2P0010 Patient Active Problem List   Diagnosis Date Noted  . Supervision of normal pregnancy, antepartum 04/09/2015  . 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 Pap smear collected. Early 1 hr. Prenatal vitamins. Problem list reviewed and updated. Genetic Screening discussed First Screen: declined. Follow up in 4 weeks.  Marlis EdelsonKARIM, WALIDAH N  04/09/2015   

## 2015-04-09 NOTE — Progress Notes (Signed)
Bedside US for viability = Single IUP, FHR - 154 bpm per PW doppler. FM present.

## 2015-04-10 ENCOUNTER — Encounter: Payer: Self-pay | Admitting: Family

## 2015-04-10 DIAGNOSIS — R7309 Other abnormal glucose: Secondary | ICD-10-CM | POA: Insufficient documentation

## 2015-04-10 LAB — GLUCOSE TOLERANCE, 1 HOUR (50G) W/O FASTING: Glucose, 1 Hr, gestational: 162 mg/dL — ABNORMAL HIGH (ref <140)

## 2015-04-10 LAB — CULTURE, OB URINE: Colony Count: 50000

## 2015-04-10 LAB — CYTOLOGY - PAP

## 2015-04-11 LAB — PRENATAL PROFILE (SOLSTAS)
Antibody Screen: NEGATIVE
Basophils Absolute: 0 cells/uL (ref 0–200)
Basophils Relative: 0 %
Eosinophils Absolute: 95 cells/uL (ref 15–500)
Eosinophils Relative: 1 %
HCT: 36.9 % (ref 35.0–45.0)
HIV 1&2 Ab, 4th Generation: NONREACTIVE
Hemoglobin: 12.3 g/dL (ref 11.7–15.5)
Hepatitis B Surface Ag: NEGATIVE
Lymphocytes Relative: 21 %
Lymphs Abs: 1995 cells/uL (ref 850–3900)
MCH: 30.6 pg (ref 27.0–33.0)
MCHC: 33.3 g/dL (ref 32.0–36.0)
MCV: 91.8 fL (ref 80.0–100.0)
MPV: 9.7 fL (ref 7.5–12.5)
Monocytes Absolute: 380 cells/uL (ref 200–950)
Monocytes Relative: 4 %
Neutro Abs: 7030 cells/uL (ref 1500–7800)
Neutrophils Relative %: 74 %
Platelets: 263 10*3/uL (ref 140–400)
RBC: 4.02 MIL/uL (ref 3.80–5.10)
RDW: 13.3 % (ref 11.0–15.0)
Rh Type: POSITIVE
Rubella: 1.19 Index — ABNORMAL HIGH (ref <0.90)
WBC: 9.5 10*3/uL (ref 3.8–10.8)

## 2015-04-11 LAB — CANNABANOIDS (GC/LC/MS), URINE: THC-COOH (GC/LC/MS), ur confirm: 296 ng/mL — AB (ref <5)

## 2015-04-12 LAB — PRESCRIPTION MONITORING PROFILE (19 PANEL)
Amphetamine/Meth: NEGATIVE ng/mL
Barbiturate Screen, Urine: NEGATIVE ng/mL
Benzodiazepine Screen, Urine: NEGATIVE ng/mL
Buprenorphine, Urine: NEGATIVE ng/mL
Carisoprodol, Urine: NEGATIVE ng/mL
Cocaine Metabolites: NEGATIVE ng/mL
Creatinine, Urine: 168.85 mg/dL (ref >20.0)
Fentanyl, Ur: NEGATIVE ng/mL
MDMA URINE: NEGATIVE ng/mL
Meperidine, Ur: NEGATIVE ng/mL
Methadone Screen, Urine: NEGATIVE ng/mL
Methaqualone: NEGATIVE ng/mL
Nitrites, Initial: NEGATIVE ug/mL
Opiate Screen, Urine: NEGATIVE ng/mL
Oxycodone Screen, Ur: NEGATIVE ng/mL
Phencyclidine, Ur: NEGATIVE ng/mL
Propoxyphene: NEGATIVE ng/mL
Tapentadol, urine: NEGATIVE ng/mL
Tramadol Scrn, Ur: NEGATIVE ng/mL
Zolpidem, Urine: NEGATIVE ng/mL
pH, Initial: 6 pH (ref 4.5–8.9)

## 2015-04-15 ENCOUNTER — Telehealth: Payer: Self-pay | Admitting: General Practice

## 2015-04-15 NOTE — Telephone Encounter (Signed)
Called patient and informed her of 1 hr gtt and need to come in for 3 hr gtt. Informed patient she will need to arrive fasting & and the test will take 3 hrs. Patient verbalized understanding & states she can come tomorrow at 8am. Patient had no questions

## 2015-04-16 ENCOUNTER — Other Ambulatory Visit: Payer: Self-pay

## 2015-04-16 ENCOUNTER — Encounter (HOSPITAL_COMMUNITY): Payer: Self-pay | Admitting: Certified Nurse Midwife

## 2015-04-16 ENCOUNTER — Inpatient Hospital Stay (HOSPITAL_COMMUNITY)
Admission: AD | Admit: 2015-04-16 | Discharge: 2015-04-16 | Disposition: A | Discharge status: Home / Self Care | Payer: Medicaid Other | Source: Ambulatory Visit | Attending: Family Medicine | Admitting: Family Medicine

## 2015-04-16 DIAGNOSIS — Z79899 Other long term (current) drug therapy: Secondary | ICD-10-CM | POA: Diagnosis not present

## 2015-04-16 DIAGNOSIS — O26891 Other specified pregnancy related conditions, first trimester: Secondary | ICD-10-CM | POA: Diagnosis not present

## 2015-04-16 DIAGNOSIS — O210 Mild hyperemesis gravidarum: Secondary | ICD-10-CM | POA: Diagnosis present

## 2015-04-16 DIAGNOSIS — K219 Gastro-esophageal reflux disease without esophagitis: Secondary | ICD-10-CM | POA: Diagnosis not present

## 2015-04-16 DIAGNOSIS — F17210 Nicotine dependence, cigarettes, uncomplicated: Secondary | ICD-10-CM | POA: Diagnosis not present

## 2015-04-16 DIAGNOSIS — Z3A13 13 weeks gestation of pregnancy: Secondary | ICD-10-CM | POA: Diagnosis not present

## 2015-04-16 DIAGNOSIS — J45909 Unspecified asthma, uncomplicated: Secondary | ICD-10-CM | POA: Insufficient documentation

## 2015-04-16 DIAGNOSIS — O219 Vomiting of pregnancy, unspecified: Secondary | ICD-10-CM | POA: Diagnosis not present

## 2015-04-16 DIAGNOSIS — O09292 Supervision of pregnancy with other poor reproductive or obstetric history, second trimester: Secondary | ICD-10-CM

## 2015-04-16 LAB — CBC
HCT: 35.2 % — ABNORMAL LOW (ref 36.0–46.0)
Hemoglobin: 12.3 g/dL (ref 12.0–15.0)
MCH: 31.5 pg (ref 26.0–34.0)
MCHC: 34.9 g/dL (ref 30.0–36.0)
MCV: 90.3 fL (ref 78.0–100.0)
Platelets: 245 10*3/uL (ref 150–400)
RBC: 3.9 MIL/uL (ref 3.87–5.11)
RDW: 12.8 % (ref 11.5–15.5)
WBC: 15.9 10*3/uL — ABNORMAL HIGH (ref 4.0–10.5)

## 2015-04-16 LAB — COMPREHENSIVE METABOLIC PANEL
ALT: 41 U/L (ref 14–54)
AST: 32 U/L (ref 15–41)
Albumin: 3.7 g/dL (ref 3.5–5.0)
Alkaline Phosphatase: 53 U/L (ref 38–126)
Anion gap: 8 (ref 5–15)
BUN: 10 mg/dL (ref 6–20)
CO2: 24 mmol/L (ref 22–32)
Calcium: 9.3 mg/dL (ref 8.9–10.3)
Chloride: 104 mmol/L (ref 101–111)
Creatinine, Ser: 0.48 mg/dL (ref 0.44–1.00)
GFR calc Af Amer: >60 mL/min (ref >60)
GFR calc non Af Amer: >60 mL/min (ref >60)
Glucose, Bld: 136 mg/dL — ABNORMAL HIGH (ref 65–99)
Potassium: 3.5 mmol/L (ref 3.5–5.1)
Sodium: 136 mmol/L (ref 135–145)
Total Bilirubin: 0.4 mg/dL (ref 0.3–1.2)
Total Protein: 7.6 g/dL (ref 6.5–8.1)

## 2015-04-16 MED ORDER — FAMOTIDINE IN NACL 20-0.9 MG/50ML-% IV SOLN
20.0000 mg | INTRAVENOUS | Status: AC
  Administered 2015-04-16: 20 mg via INTRAVENOUS
  Filled 2015-04-16: qty 50

## 2015-04-16 MED ORDER — METOCLOPRAMIDE HCL 5 MG/ML IJ SOLN
10.0000 mg | Freq: Once | INTRAMUSCULAR | Status: AC
  Administered 2015-04-16: 10 mg via INTRAVENOUS
  Filled 2015-04-16: qty 2

## 2015-04-16 MED ORDER — LACTATED RINGERS IV SOLN
INTRAVENOUS | Status: DC
  Administered 2015-04-16: 20:00:00 via INTRAVENOUS

## 2015-04-16 NOTE — MAU Note (Signed)
Pt given discharge instructions and pt verbalized understanding.

## 2015-04-16 NOTE — MAU Note (Signed)
Pt states she feels much better after IV fluids and medications.

## 2015-04-16 NOTE — Discharge Instructions (Signed)

## 2015-04-16 NOTE — MAU Note (Signed)
Worsening vomiting.  No fever or diarrhea.  Pain and burning in upper abd.

## 2015-04-16 NOTE — MAU Provider Note (Signed)
Chief Complaint: Emesis During Pregnancy   None     SUBJECTIVE HPI: Bonnie Shepard is a 30 y.o. G2P0010 at 4429w2d by LMP who presents to maternity admissions reporting onset of vomiting after 3 hour glucose test in [facility] today. She reports vomiting 10+ times sine onset.  She had a loss at 16 weeks with previous pregnancy and reported delivering the baby related to an episode of vomiting like this.  She has not taken medication for her vomiting. It is worsened by eating/drinking.  She reports it is associated with burning upper abdominal pain but she denies lower abdominal pain or cramping.   She denies vaginal bleeding, vaginal itching/burning, urinary symptoms, h/a, dizziness, or fever/chills.     HPI  Past Medical History  Diagnosis Date  . Asthma   . Vaginal Pap smear, abnormal     f/u wnl  . Infection     freq UTI  . Acid reflux    Past Surgical History  Procedure Laterality Date  . Dilation and evacuation N/A 09/07/2013    Procedure: DILATATION AND EVACUATION;  Surgeon: Freddrick MarchKendra H. Tenny Crawoss, MD;  Location: [facility];  Service: Gynecology;  Laterality: N/A;  . Tonsillectomy     Social History   Social History  . Marital Status: Single    Spouse Name: N/A  . Number of Children: N/A  . Years of Education: N/A   Occupational History  . Not on file.   Social History Main Topics  . Smoking status: Current Some Day Smoker -- 7 years    Types: Cigarettes  . Smokeless tobacco: Never Used  . Alcohol Use: No     Comment: occasionally  . Drug Use: No  . Sexual Activity: Yes    Birth Control/ Protection: Condom   Other Topics Concern  . Not on file   Social History Narrative   No current facility-administered medications on file prior to encounter.   Current Outpatient Prescriptions on File Prior to Encounter  Medication Sig Dispense Refill  . acetaminophen (TYLENOL) 500 MG tablet Take 500 mg by mouth every 6 (six) hours as needed for headache. Reported on 04/09/2015    . metoCLOPramide  (REGLAN) 10 MG tablet Take 1 tablet (10 mg total) by mouth 4 (four) times daily -  before meals and at bedtime. (Patient not taking: Reported on 04/09/2015) 30 tablet 0  . Prenatal Vit-Fe Fumarate-FA (PRENATAL MULTIVITAMIN) TABS tablet Take 1 tablet by mouth daily at 12 noon.    . ranitidine (ZANTAC) 150 MG capsule Take 1 capsule (150 mg total) by mouth 2 (two) times daily. (Patient not taking: Reported on 04/09/2015) 60 capsule 0   No Known Allergies  ROS:  Review of Systems  Constitutional: Negative for fever, chills and fatigue.  Respiratory: Negative for shortness of breath.   Cardiovascular: Negative for chest pain.  Gastrointestinal: Positive for nausea, vomiting and abdominal pain.  Genitourinary: Negative for dysuria, flank pain, vaginal bleeding, vaginal discharge, difficulty urinating, vaginal pain and pelvic pain.  Neurological: Negative for dizziness and headaches.  Psychiatric/Behavioral: Negative.      I have reviewed patient's Past Medical Hx, Surgical Hx, Family Hx, Social Hx, medications and allergies.   Physical Exam   Patient Vitals for the past 24 hrs:  BP Temp Temp src Pulse Resp Height Weight  04/16/15 1835 136/73 mmHg 97.9 F (36.6 C) Oral (!) 58 20 5\' 3"  (1.6 m) 204 lb (92.534 kg)  04/16/15 1829 - - - - - - 204 lb (92.534 kg)  Constitutional: Well-developed, well-nourished female in no acute distress.  Cardiovascular: normal rate Respiratory: normal effort GI: Abd soft, non-tender. Pos BS x 4 MS: Extremities nontender, no edema, normal ROM Neurologic: Alert and oriented x 4.  GU: Neg CVAT.  PELVIC EXAM: Deferred  FHT 143 by doppler  LAB RESULTS No results found for this or any previous visit (from the past 24 hour(s)).  O/POS/-- (04/04 0454)  IMAGING No results found.  MAU Management/MDM: CBC, CMP, UA ordered. FHT wnl by doppler.  Will treat nausea with IV fluids, Reglan 10 mg IV, Pepcid 20 mg IV.  RN unable to start IV. CRNA in room to attempt IV  start.    ASSESSMENT 1. Nausea and vomiting during pregnancy prior to [redacted] weeks gestation   2. Prior perinatal loss in second trimester, antepartum    Report to Judeth Horn, NP     Medication List    ASK your doctor about these medications        acetaminophen 500 MG tablet  Commonly known as:  TYLENOL  Take 500 mg by mouth every 6 (six) hours as needed for headache. Reported on 04/09/2015     metoCLOPramide 10 MG tablet  Commonly known as:  REGLAN  Take 1 tablet (10 mg total) by mouth 4 (four) times daily -  before meals and at bedtime.     prenatal multivitamin Tabs tablet  Take 1 tablet by mouth daily at 12 noon.     ranitidine 150 MG capsule  Commonly known as:  ZANTAC  Take 1 capsule (150 mg total) by mouth 2 (two) times daily.         Sharen Counter Certified Nurse-Midwife 04/16/2015  8:10 PM

## 2015-04-16 NOTE — MAU Note (Signed)
4 attempts made at an IV start by 2 RNs. CRNA notified that the attempts were unsuccessful. CRNA will come to assess pt for an IV. Pt's arms wrapped in warm blankets.

## 2015-04-16 NOTE — MAU Note (Signed)
Pt states she had the 3 hour glucose test today at the office and has not been able to keep anything down since then. Pt began vomiting at 9AM. Pt states she has a previous loss at 16 weeks that started with excessive vomiting. Pt states she is nervous this may be happening again. Pt states she is feeling shaky and has tingling in her hands and nose. Pt denies vaginal bleeding.

## 2015-04-17 NOTE — MAU Provider Note (Signed)
[facility] at 2000.   Bedside ultrasound by Dr. Adrian BlackwaterStinson for patient reassurance d/t history of 2nd trimester loss.  Pt reports improvement in symptoms with IV fluids & antiemetic & is requesting to go home.   A: 1. Nausea and vomiting during pregnancy prior to [redacted] weeks gestation   2. Prior perinatal loss in second trimester, antepartum     P; Discharge home Pt has antiemetics at home Keep f/u in clinic  Judeth HornErin Lilit Cinelli, NP

## 2015-04-18 ENCOUNTER — Inpatient Hospital Stay (HOSPITAL_COMMUNITY)
Admission: AD | Admit: 2015-04-18 | Discharge: 2015-04-18 | Disposition: A | Discharge status: Home / Self Care | Payer: Medicaid Other | Source: Ambulatory Visit | Attending: Obstetrics & Gynecology | Admitting: Obstetrics & Gynecology

## 2015-04-18 ENCOUNTER — Emergency Department
Admission: EM | Admit: 2015-04-18 | Discharge: 2015-04-19 | Disposition: A | Discharge status: Home / Self Care | Payer: Medicaid Other | Attending: Emergency Medicine | Admitting: Emergency Medicine

## 2015-04-18 ENCOUNTER — Encounter: Payer: Self-pay | Admitting: Urgent Care

## 2015-04-18 DIAGNOSIS — Z3A14 14 weeks gestation of pregnancy: Secondary | ICD-10-CM | POA: Diagnosis not present

## 2015-04-18 DIAGNOSIS — Z3481 Encounter for supervision of other normal pregnancy, first trimester: Secondary | ICD-10-CM

## 2015-04-18 DIAGNOSIS — K219 Gastro-esophageal reflux disease without esophagitis: Secondary | ICD-10-CM | POA: Diagnosis not present

## 2015-04-18 DIAGNOSIS — O99612 Diseases of the digestive system complicating pregnancy, second trimester: Secondary | ICD-10-CM | POA: Diagnosis not present

## 2015-04-18 DIAGNOSIS — J45909 Unspecified asthma, uncomplicated: Secondary | ICD-10-CM | POA: Insufficient documentation

## 2015-04-18 DIAGNOSIS — R112 Nausea with vomiting, unspecified: Secondary | ICD-10-CM

## 2015-04-18 DIAGNOSIS — O210 Mild hyperemesis gravidarum: Secondary | ICD-10-CM | POA: Diagnosis present

## 2015-04-18 DIAGNOSIS — F17210 Nicotine dependence, cigarettes, uncomplicated: Secondary | ICD-10-CM | POA: Diagnosis not present

## 2015-04-18 DIAGNOSIS — O99332 Smoking (tobacco) complicating pregnancy, second trimester: Secondary | ICD-10-CM | POA: Insufficient documentation

## 2015-04-18 DIAGNOSIS — Z5321 Procedure and treatment not carried out due to patient leaving prior to being seen by health care provider: Secondary | ICD-10-CM | POA: Insufficient documentation

## 2015-04-18 DIAGNOSIS — O09292 Supervision of pregnancy with other poor reproductive or obstetric history, second trimester: Secondary | ICD-10-CM

## 2015-04-18 DIAGNOSIS — O99512 Diseases of the respiratory system complicating pregnancy, second trimester: Secondary | ICD-10-CM | POA: Insufficient documentation

## 2015-04-18 DIAGNOSIS — R7309 Other abnormal glucose: Secondary | ICD-10-CM

## 2015-04-18 DIAGNOSIS — O219 Vomiting of pregnancy, unspecified: Secondary | ICD-10-CM | POA: Insufficient documentation

## 2015-04-18 LAB — URINALYSIS, ROUTINE W REFLEX MICROSCOPIC
Bilirubin Urine: NEGATIVE
Glucose, UA: NEGATIVE mg/dL
Ketones, ur: >80 mg/dL — AB
Leukocytes, UA: NEGATIVE
Nitrite: NEGATIVE
Protein, ur: NEGATIVE mg/dL
Specific Gravity, Urine: 1.025 (ref 1.005–1.030)
pH: 6 (ref 5.0–8.0)

## 2015-04-18 LAB — URINE MICROSCOPIC-ADD ON

## 2015-04-18 MED ORDER — SODIUM CHLORIDE 0.9 % IV BOLUS (SEPSIS)
1000.0000 mL | Freq: Once | INTRAVENOUS | Status: AC
Start: 2015-04-19 — End: 2015-04-19
  Administered 2015-04-19: 1000 mL via INTRAVENOUS

## 2015-04-18 MED ORDER — SODIUM CHLORIDE 0.9 % IV BOLUS (SEPSIS)
1000.0000 mL | Freq: Once | INTRAVENOUS | Status: AC
  Administered 2015-04-19: 1000 mL via INTRAVENOUS

## 2015-04-18 MED ORDER — ONDANSETRON HCL 4 MG/2ML IJ SOLN
4.0000 mg | Freq: Once | INTRAMUSCULAR | Status: AC
  Administered 2015-04-19: 4 mg via INTRAVENOUS
  Filled 2015-04-18: qty 2

## 2015-04-18 NOTE — MAU Note (Signed)
Not in lobby x2.

## 2015-04-18 NOTE — MAU Note (Signed)
Called pt from waiting room.  Pt was not in waiting room.

## 2015-04-18 NOTE — MAU Note (Signed)
Not in lobby x3. Pt left AMA without notifying staff 

## 2015-04-18 NOTE — ED Notes (Addendum)
Patient presents with c/o N/V s/o glucose tolerence test on Tuesday of this week. Patient given one bag of NS and sent home "without a diagnosis". Patient reports that she has not stopped vomiting since Tuesday. Patient states, "I need bags and bags of fluid". Patient reports she is a 14 week G2-P0-A1s.

## 2015-04-18 NOTE — MAU Note (Signed)
Pt reports she has not been able to keep anything down since Tuesday. Taking Reglan and Pepcid without relief.

## 2015-04-19 LAB — COMPREHENSIVE METABOLIC PANEL
ALT: 55 U/L — ABNORMAL HIGH (ref 14–54)
AST: 38 U/L (ref 15–41)
Albumin: 3.9 g/dL (ref 3.5–5.0)
Alkaline Phosphatase: 50 U/L (ref 38–126)
Anion gap: 11 (ref 5–15)
BUN: 12 mg/dL (ref 6–20)
CO2: 27 mmol/L (ref 22–32)
Calcium: 8.8 mg/dL — ABNORMAL LOW (ref 8.9–10.3)
Chloride: 95 mmol/L — ABNORMAL LOW (ref 101–111)
Creatinine, Ser: 0.44 mg/dL (ref 0.44–1.00)
GFR calc Af Amer: >60 mL/min (ref >60)
GFR calc non Af Amer: >60 mL/min (ref >60)
Glucose, Bld: 112 mg/dL — ABNORMAL HIGH (ref 65–99)
Potassium: 2.4 mmol/L — CL (ref 3.5–5.1)
Sodium: 133 mmol/L — ABNORMAL LOW (ref 135–145)
Total Bilirubin: 0.7 mg/dL (ref 0.3–1.2)
Total Protein: 7.4 g/dL (ref 6.5–8.1)

## 2015-04-19 LAB — CBC
HCT: 36.5 % (ref 35.0–47.0)
Hemoglobin: 12.7 g/dL (ref 12.0–16.0)
MCH: 31.5 pg (ref 26.0–34.0)
MCHC: 34.9 g/dL (ref 32.0–36.0)
MCV: 90.3 fL (ref 80.0–100.0)
Platelets: 247 10*3/uL (ref 150–440)
RBC: 4.04 MIL/uL (ref 3.80–5.20)
RDW: 13.1 % (ref 11.5–14.5)
WBC: 14.7 10*3/uL — ABNORMAL HIGH (ref 3.6–11.0)

## 2015-04-19 LAB — URINALYSIS COMPLETE WITH MICROSCOPIC (ARMC ONLY)
Bilirubin Urine: NEGATIVE
Glucose, UA: NEGATIVE mg/dL
Hgb urine dipstick: NEGATIVE
Leukocytes, UA: NEGATIVE
Nitrite: NEGATIVE
Protein, ur: 100 mg/dL — AB
Specific Gravity, Urine: 1.026 (ref 1.005–1.030)
pH: 8 (ref 5.0–8.0)

## 2015-04-19 MED ORDER — ALUM & MAG HYDROXIDE-SIMETH 200-200-20 MG/5ML PO SUSP
30.0000 mL | Freq: Once | ORAL | Status: AC
  Administered 2015-04-19: 30 mL via ORAL
  Filled 2015-04-19: qty 30

## 2015-04-19 MED ORDER — ONDANSETRON 4 MG PO TBDP: 4.0000 mg | ORAL_TABLET | Freq: Three times a day (TID) | ORAL | Status: DC | PRN

## 2015-04-19 NOTE — ED Notes (Signed)
MD made aware of K level.

## 2015-04-19 NOTE — ED Provider Notes (Signed)
[facility] Emergency Department Provider Note  Time seen: 12:04 AM  I have reviewed the triage vital signs and the nursing notes.   HISTORY  Chief Complaint Emesis    HPI Bonnie Shepard is a 30 y.o. female approximately [redacted] weeks pregnant who presents the emergency department with nausea and vomiting. According to the patient throughout the pregnancy she has had occasional nausea and vomiting approximately once or twice a week per patient. She states since her glucose tolerance test 2 days ago she has had nonstop vomiting. Patient actively vomiting in the emergency department. States mild upper abdominal pain. Denies diarrhea, dysuria, vaginal bleeding. Describes the nausea and vomiting as severe.     Past Medical History  Diagnosis Date  . Asthma   . Vaginal Pap smear, abnormal     f/u wnl  . Infection     freq UTI  . Acid reflux     Patient Active Problem List   Diagnosis Date Noted  . Glucose tolerance test abnormal 04/10/2015  . Supervision of normal pregnancy, antepartum 04/09/2015  . Prior perinatal loss in second trimester, antepartum 04/09/2015    Past Surgical History  Procedure Laterality Date  . Dilation and evacuation N/A 09/07/2013    Procedure: DILATATION AND EVACUATION;  Surgeon: Freddrick March. Tenny Craw, MD;  Location: [facility];  Service: Gynecology;  Laterality: N/A;  . Tonsillectomy      Current Outpatient Rx  Name  Route  Sig  Dispense  Refill  . acetaminophen (TYLENOL) 500 MG tablet   Oral   Take 500 mg by mouth every 6 (six) hours as needed for headache. Reported on 04/09/2015         . metoCLOPramide (REGLAN) 10 MG tablet   Oral   Take 1 tablet (10 mg total) by mouth 4 (four) times daily -  before meals and at bedtime.   30 tablet   0   . Prenatal Vit-Fe Fumarate-FA (PRENATAL MULTIVITAMIN) TABS tablet   Oral   Take 1 tablet by mouth daily at 12 noon.         . ranitidine (ZANTAC) 150 MG capsule   Oral   Take 1 capsule (150 mg  total) by mouth 2 (two) times daily. Patient taking differently: Take 150 mg by mouth daily as needed for heartburn.    60 capsule   0     Allergies Review of patient's allergies indicates no known allergies.  Family History  Problem Relation Age of Onset  . Hypertension Other   . Cancer Other   . Hypertension Mother   . Hypertension Father   . Hypertension Maternal Grandmother   . Heart disease Maternal Grandmother   . Hearing loss Neg Hx     Social History Social History  Substance Use Topics  . Smoking status: Current Some Day Smoker -- 7 years    Types: Cigarettes  . Smokeless tobacco: Never Used  . Alcohol Use: No     Comment: occasionally    Review of Systems Constitutional: Negative for fever. Cardiovascular: Negative for chest pain. Respiratory: Negative for shortness of breath. Gastrointestinal: Mild epigastric pain. Positive for nausea and vomiting. Negative for diarrhea. Genitourinary: Negative for dysuria. Neurological: Negative for headache 10-point ROS otherwise negative.  ____________________________________________   PHYSICAL EXAM:  VITAL SIGNS: ED Triage Vitals  Enc Vitals Group     BP 04/18/15 2347 147/91 mmHg     Pulse Rate 04/18/15 2347 71     Resp 04/18/15 2347 18  Temp 04/18/15 2347 98.6 F (37 C)     Temp Source 04/18/15 2347 Oral     SpO2 04/18/15 2347 99 %     Weight 04/18/15 2347 202 lb (91.627 kg)     Height --      Head Cir --      Peak Flow --      Pain Score 04/18/15 2347 7     Pain Loc --      Pain Edu? --      Excl. in GC? --     Constitutional: Alert and oriented. Well appearing and in no distress. Eyes: Normal exam ENT   Head: Normocephalic and atraumatic.   Mouth/Throat: Mucous membranes are moist. Cardiovascular: Normal rate, regular rhythm. No murmur Respiratory: Normal respiratory effort without tachypnea nor retractions. Breath sounds are clear Gastrointestinal: Soft mild epigastric tenderness. No  rebound or guarding. No distention.  Musculoskeletal: Nontender with normal range of motion in all extremities Neurologic:  Normal speech and language. No gross focal neurologic deficits  Skin:  Skin is warm, dry and intact.  Psychiatric: Mood and affect are normal. Speech and behavior are normal.   ____________________________________________    INITIAL IMPRESSION / ASSESSMENT AND PLAN / ED COURSE  Pertinent labs & imaging results that were available during my care of the patient were reviewed by me and considered in my medical decision making (see chart for details).  Patient presents the emergency department [redacted] weeks pregnant with nausea and vomiting. Patient states mild epigastric pain worse with vomiting otherwise benign abdomen. Minimal epigastric tenderness to palpation. Patient actively vomiting in the emergency department. I discussed the pros and cons of Zofran use she is agreeable to Zofran. We will dose IV Zofran, IV fluids, check labs and urinalysis. Patient agreeable to plan.  Patient feeling much better, sleeping, upon awakening states the nausea is gone. Currently awaiting urinalysis results. Plan to discharge home with Zofran ODT and OB follow-up.  ____________________________________________   FINAL CLINICAL IMPRESSION(S) / ED DIAGNOSES  Nausea and vomiting Second trimester pregnancy   Minna AntisKevin Meekah Math, MD 04/19/15 [id number]

## 2015-04-19 NOTE — Discharge Instructions (Signed)

## 2015-04-19 NOTE — ED Notes (Signed)
Mother at bedside.

## 2015-04-22 ENCOUNTER — Other Ambulatory Visit: Payer: Medicaid Other

## 2015-04-22 DIAGNOSIS — O24419 Gestational diabetes mellitus in pregnancy, unspecified control: Secondary | ICD-10-CM

## 2015-04-23 LAB — GLUCOSE TOLERANCE, 3 HOURS
Glucose Tolerance, 1 hour: 155 mg/dL (ref <190)
Glucose Tolerance, 2 hour: 99 mg/dL (ref <165)
Glucose Tolerance, Fasting: 93 mg/dL (ref 65–104)
Glucose, GTT - 3 Hour: 88 mg/dL (ref <145)

## 2015-05-07 ENCOUNTER — Ambulatory Visit (INDEPENDENT_AMBULATORY_CARE_PROVIDER_SITE_OTHER): Payer: Medicaid Other | Admitting: Family

## 2015-05-07 VITALS — BP 121/80 | HR 58 | Wt 204.0 lb

## 2015-05-07 DIAGNOSIS — Z3482 Encounter for supervision of other normal pregnancy, second trimester: Secondary | ICD-10-CM

## 2015-05-07 LAB — POCT URINALYSIS DIP (DEVICE)
Bilirubin Urine: NEGATIVE
Glucose, UA: NEGATIVE mg/dL
Ketones, ur: NEGATIVE mg/dL
Nitrite: NEGATIVE
Protein, ur: 100 mg/dL — AB
Specific Gravity, Urine: 1.02 (ref 1.005–1.030)
Urobilinogen, UA: 0.2 mg/dL (ref 0.0–1.0)
pH: >=9 (ref 5.0–8.0)

## 2015-05-07 NOTE — Patient Instructions (Signed)

## 2015-05-07 NOTE — Progress Notes (Signed)
Subjective:  Bonnie Shepard is a 30 y.o. G2P0010 at 156w2d being seen today for ongoing prenatal care.  She is currently monitored for the following issues for this low-risk pregnancy and has Supervision of normal pregnancy, antepartum; Prior perinatal loss in second trimester, antepartum; and Glucose tolerance test abnormal on her problem list.  Patient reports no complaints. Believes felt a slight flutter since last visit.   Contractions: Not present. Vag. Bleeding: None.  Movement: Absent. Denies leaking of fluid.   The following portions of the patient's history were reviewed and updated as appropriate: allergies, current medications, past family history, past medical history, past social history, past surgical history and problem list. Problem list updated.  Objective:   Filed Vitals:   05/07/15 1058  BP: 121/80  Pulse: 58  Weight: 204 lb (92.534 kg)    Fetal Status: Fetal Heart Rate (bpm): 142 Fundal Height: 17 cm Movement: Absent     General:  Alert, oriented and cooperative. Patient is in no acute distress.  Skin: Skin is warm and dry. No rash noted.   Cardiovascular: Normal heart rate noted  Respiratory: Normal respiratory effort, no problems with respiration noted  Abdomen: Soft, gravid, appropriate for gestational age. Pain/Pressure: Present     Pelvic: Vag. Bleeding: None Vag D/C Character: Ihde   Cervical exam deferred        Extremities: Normal range of motion.  Edema: Trace  Mental Status: Normal mood and affect. Normal behavior. Normal judgment and thought content.   Urinalysis: Urine Protein: 2+ Urine Glucose: Negative  Assessment and Plan:  Pregnancy: G2P0010 at 3256w2d  1. Supervision of normal pregnancy, antepartum, second trimester - US MFM OB COMP + 14 WK; Future  General obstetric precautions including but not limited to vaginal bleeding and pelvic pain reviewed in detail with the patient. Please refer to After Visit Summary for other counseling recommendations.   Return in about 4 weeks (around 06/04/2015).   Eino FarberWalidah Kennith GainN Karim, CNM

## 2015-05-16 ENCOUNTER — Encounter (HOSPITAL_COMMUNITY): Payer: Self-pay | Admitting: Family

## 2015-05-24 ENCOUNTER — Other Ambulatory Visit: Payer: Self-pay | Admitting: General Practice

## 2015-05-24 ENCOUNTER — Ambulatory Visit (HOSPITAL_COMMUNITY)
Admission: RE | Admit: 2015-05-24 | Discharge: 2015-05-24 | Disposition: A | Discharge status: Home / Self Care | Payer: Medicaid Other | Source: Ambulatory Visit | Attending: Family | Admitting: Family

## 2015-05-24 DIAGNOSIS — Z3482 Encounter for supervision of other normal pregnancy, second trimester: Secondary | ICD-10-CM

## 2015-05-24 DIAGNOSIS — Z36 Encounter for antenatal screening of mother: Secondary | ICD-10-CM | POA: Insufficient documentation

## 2015-05-24 DIAGNOSIS — Z3A18 18 weeks gestation of pregnancy: Secondary | ICD-10-CM | POA: Insufficient documentation

## 2015-06-04 ENCOUNTER — Ambulatory Visit (INDEPENDENT_AMBULATORY_CARE_PROVIDER_SITE_OTHER): Payer: Medicaid Other | Admitting: Student

## 2015-06-04 ENCOUNTER — Encounter: Payer: Self-pay | Admitting: Student

## 2015-06-04 VITALS — BP 121/74 | HR 75 | Wt 207.4 lb

## 2015-06-04 DIAGNOSIS — Z3482 Encounter for supervision of other normal pregnancy, second trimester: Secondary | ICD-10-CM

## 2015-06-04 LAB — POCT URINALYSIS DIP (DEVICE)
Bilirubin Urine: NEGATIVE
Glucose, UA: NEGATIVE mg/dL
Hgb urine dipstick: NEGATIVE
Ketones, ur: NEGATIVE mg/dL
Leukocytes, UA: NEGATIVE
Nitrite: NEGATIVE
Protein, ur: NEGATIVE mg/dL
Specific Gravity, Urine: 1.02 (ref 1.005–1.030)
Urobilinogen, UA: 0.2 mg/dL (ref 0.0–1.0)
pH: 7 (ref 5.0–8.0)

## 2015-06-04 NOTE — Patient Instructions (Signed)
Second Trimester of Pregnancy The second trimester is from week 13 through week 28, month 4 through 6. This is often the time in pregnancy that you feel your best. Often times, morning sickness has lessened or quit. You may have more energy, and you may get hungry more often. Your unborn baby (fetus) is growing rapidly. At the end of the sixth month, he or she is about 9 inches long and weighs about 1 pounds. You will likely feel the baby move (quickening) between 18 and 20 weeks of pregnancy. HOME CARE   Avoid all smoking, herbs, and alcohol. Avoid drugs not approved by your doctor.  Do not use any tobacco products, including cigarettes, chewing tobacco, and electronic cigarettes. If you need help quitting, ask your doctor. You may get counseling or other support to help you quit.  Only take medicine as told by your doctor. Some medicines are safe and some are not during pregnancy.  Exercise only as told by your doctor. Stop exercising if you start having cramps.  Eat regular, healthy meals.  Wear a good support bra if your breasts are tender.  Do not use hot tubs, steam rooms, or saunas.  Wear your seat belt when driving.  Avoid raw meat, uncooked cheese, and liter boxes and soil used by cats.  Take your prenatal vitamins.  Take 1500-2000 milligrams of calcium daily starting at the 20th week of pregnancy until you deliver your baby.  Try taking medicine that helps you poop (stool softener) as needed, and if your doctor approves. Eat more fiber by eating fresh fruit, vegetables, and whole grains. Drink enough fluids to keep your pee (urine) clear or pale yellow.  Take warm water baths (sitz baths) to soothe pain or discomfort caused by hemorrhoids. Use hemorrhoid cream if your doctor approves.  If you have puffy, bulging veins (varicose veins), wear support hose. Raise (elevate) your feet for 15 minutes, 3-4 times a day. Limit salt in your diet.  Avoid heavy lifting, wear low heals,  and sit up straight.  Rest with your legs raised if you have leg cramps or low back pain.  Visit your dentist if you have not gone during your pregnancy. Use a soft toothbrush to brush your teeth. Be gentle when you floss.  You can have sex (intercourse) unless your doctor tells you not to.  Go to your doctor visits. GET HELP IF:   You feel dizzy.  You have mild cramps or pressure in your lower belly (abdomen).  You have a nagging pain in your belly area.  You continue to feel sick to your stomach (nauseous), throw up (vomit), or have watery poop (diarrhea).  You have bad smelling fluid coming from your vagina.  You have pain with peeing (urination). GET HELP RIGHT AWAY IF:   You have a fever.  You are leaking fluid from your vagina.  You have spotting or bleeding from your vagina.  You have severe belly cramping or pain.  You lose or gain weight rapidly.  You have trouble catching your breath and have chest pain.  You notice sudden or extreme puffiness (swelling) of your face, hands, ankles, feet, or legs.  You have not felt the baby move in over an hour.  You have severe headaches that do not go away with medicine.  You have vision changes.   This information is not intended to replace advice given to you by your health care provider. Make sure you discuss any questions you have with your  health care provider.   Document Released: 03/18/2009 Document Revised: 01/12/2014 Document Reviewed: 02/23/2012 Elsevier Interactive Patient Education 2016 Elsevier Inc. Carpal Tunnel Syndrome Carpal tunnel syndrome is a condition that causes pain in your hand and arm. The carpal tunnel is a narrow area located on the palm side of your wrist. Repeated wrist motion or certain diseases may cause swelling within the tunnel. This swelling pinches the main nerve in the wrist (median nerve). CAUSES  This condition may be caused by:   Repeated wrist motions.  Wrist  injuries.  Arthritis.  A cyst or tumor in the carpal tunnel.  Fluid buildup during pregnancy. Sometimes the cause of this condition is not known.  RISK FACTORS This condition is more likely to develop in:   People who have jobs that cause them to repeatedly move their wrists in the same motion, such as butchers and cashiers.  Women.  People with certain conditions, such as:  Diabetes.  Obesity.  An underactive thyroid (hypothyroidism).  Kidney failure. SYMPTOMS  Symptoms of this condition include:   A tingling feeling in your fingers, especially in your thumb, index, and middle fingers.  Tingling or numbness in your hand.  An aching feeling in your entire arm, especially when your wrist and elbow are bent for long periods of time.  Wrist pain that goes up your arm to your shoulder.  Pain that goes down into your palm or fingers.  A weak feeling in your hands. You may have trouble grabbing and holding items. Your symptoms may feel worse during the night.  DIAGNOSIS  This condition is diagnosed with a medical history and physical exam. You may also have tests, including:   An electromyogram (EMG). This test measures electrical signals sent by your nerves into the muscles.  X-rays. TREATMENT  Treatment for this condition includes:  Lifestyle changes. It is important to stop doing or modify the activity that caused your condition.  Physical or occupational therapy.  Medicines for pain and inflammation. This may include medicine that is injected into your wrist.  A wrist splint.  Surgery. HOME CARE INSTRUCTIONS  If You Have a Splint:  Wear it as told by your health care provider. Remove it only as told by your health care provider.  Loosen the splint if your fingers become numb and tingle, or if they turn cold and blue.  Keep the splint clean and dry. General Instructions  Take over-the-counter and prescription medicines only as told by your health care  provider.  Rest your wrist from any activity that may be causing your pain. If your condition is work related, talk to your employer about changes that can be made, such as getting a wrist pad to use while typing.  If directed, apply ice to the painful area:  Put ice in a plastic bag.  Place a towel between your skin and the bag.  Leave the ice on for 20 minutes, 2-3 times per day.  Keep all follow-up visits as told by your health care provider. This is important.  Do any exercises as told by your health care provider, physical therapist, or occupational therapist. SEEK MEDICAL CARE IF:   You have new symptoms.  Your pain is not controlled with medicines.  Your symptoms get worse.   This information is not intended to replace advice given to you by your health care provider. Make sure you discuss any questions you have with your health care provider.   Document Released: 12/20/1999 Document Revised: 09/12/2014 Document  Reviewed: 05/09/2014 Elsevier Interactive Patient Education [employer]2016 Elsevier Inc.

## 2015-06-04 NOTE — Progress Notes (Signed)
Educated pt on Rooming In  

## 2015-06-04 NOTE — Progress Notes (Signed)
Subjective:  Bonnie Shepard is a 30 y.o. G2P0010 at 3371w2d being seen today for ongoing prenatal care.  She is currently monitored for the following issues for this high-risk pregnancy and has Supervision of normal pregnancy, antepartum; Prior perinatal loss in second trimester, antepartum; and Glucose tolerance test abnormal on her problem list.  Patient reports no complaints.  Contractions: Not present. Vag. Bleeding: None.   . Denies leaking of fluid.   The following portions of the patient's history were reviewed and updated as appropriate: allergies, current medications, past family history, past medical history, past social history, past surgical history and problem list. Problem list updated.  Objective:   Filed Vitals:   06/04/15 0846  BP: 121/74  Pulse: 75  Weight: 207 lb 6.4 oz (94.076 kg)    Fetal Status: Fetal Heart Rate (bpm): 145         General:  Alert, oriented and cooperative. Patient is in no acute distress.  Skin: Skin is warm and dry. No rash noted.   Cardiovascular: Normal heart rate noted  Respiratory: Normal respiratory effort, no problems with respiration noted  Abdomen: Soft, gravid, appropriate for gestational age. Pain/Pressure: Present     Pelvic: Vag. Bleeding: None Vag D/C Character: [place]   Cervical exam deferred        Extremities: Normal range of motion.  Edema: Trace  Mental Status: Normal mood and affect. Normal behavior. Normal judgment and thought content.   Urinalysis: Urine Protein: Negative Urine Glucose: Negative  Assessment and Plan:  Pregnancy: G2P0010 at 3971w2d  1. Supervision of normal pregnancy, antepartum, second trimester  - US MFM OB FOLLOW UP; Future to complete anatomy  Preterm labor symptoms and general obstetric precautions including but not limited to vaginal bleeding, contractions, leaking of fluid and fetal movement were reviewed in detail with the patient. Please refer to After Visit Summary for other counseling recommendations.   Return in about 4 weeks (around 07/02/2015) for Routine OB.   Judeth HornErin Yakov Bergen, NP

## 2015-06-07 ENCOUNTER — Inpatient Hospital Stay (HOSPITAL_COMMUNITY)
Admission: AD | Admit: 2015-06-07 | Discharge: 2015-06-07 | Disposition: A | Discharge status: Home / Self Care | Payer: Medicaid Other | Source: Ambulatory Visit | Attending: Obstetrics and Gynecology | Admitting: Obstetrics and Gynecology

## 2015-06-07 ENCOUNTER — Observation Stay
Admission: EM | Admit: 2015-06-07 | Discharge: 2015-06-07 | Disposition: A | Discharge status: Home / Self Care | Payer: Medicaid Other | Attending: Obstetrics and Gynecology | Admitting: Obstetrics and Gynecology

## 2015-06-07 ENCOUNTER — Encounter: Payer: Self-pay | Admitting: *Deleted

## 2015-06-07 ENCOUNTER — Encounter (HOSPITAL_COMMUNITY): Payer: Self-pay | Admitting: *Deleted

## 2015-06-07 DIAGNOSIS — Z3A20 20 weeks gestation of pregnancy: Secondary | ICD-10-CM | POA: Diagnosis not present

## 2015-06-07 DIAGNOSIS — O99332 Smoking (tobacco) complicating pregnancy, second trimester: Secondary | ICD-10-CM | POA: Diagnosis not present

## 2015-06-07 DIAGNOSIS — Z3A31 31 weeks gestation of pregnancy: Secondary | ICD-10-CM | POA: Insufficient documentation

## 2015-06-07 DIAGNOSIS — J45909 Unspecified asthma, uncomplicated: Secondary | ICD-10-CM | POA: Insufficient documentation

## 2015-06-07 DIAGNOSIS — R7309 Other abnormal glucose: Secondary | ICD-10-CM

## 2015-06-07 DIAGNOSIS — K219 Gastro-esophageal reflux disease without esophagitis: Secondary | ICD-10-CM | POA: Insufficient documentation

## 2015-06-07 DIAGNOSIS — Z3482 Encounter for supervision of other normal pregnancy, second trimester: Secondary | ICD-10-CM

## 2015-06-07 DIAGNOSIS — R1110 Vomiting, unspecified: Secondary | ICD-10-CM

## 2015-06-07 DIAGNOSIS — O212 Late vomiting of pregnancy: Principal | ICD-10-CM | POA: Insufficient documentation

## 2015-06-07 DIAGNOSIS — F17210 Nicotine dependence, cigarettes, uncomplicated: Secondary | ICD-10-CM | POA: Diagnosis not present

## 2015-06-07 DIAGNOSIS — O210 Mild hyperemesis gravidarum: Secondary | ICD-10-CM | POA: Diagnosis not present

## 2015-06-07 DIAGNOSIS — O99512 Diseases of the respiratory system complicating pregnancy, second trimester: Secondary | ICD-10-CM | POA: Insufficient documentation

## 2015-06-07 DIAGNOSIS — O219 Vomiting of pregnancy, unspecified: Secondary | ICD-10-CM

## 2015-06-07 DIAGNOSIS — O99612 Diseases of the digestive system complicating pregnancy, second trimester: Secondary | ICD-10-CM | POA: Insufficient documentation

## 2015-06-07 DIAGNOSIS — O99820 Streptococcus B carrier state complicating pregnancy: Secondary | ICD-10-CM

## 2015-06-07 DIAGNOSIS — O09292 Supervision of pregnancy with other poor reproductive or obstetric history, second trimester: Secondary | ICD-10-CM

## 2015-06-07 LAB — CBC WITH DIFFERENTIAL/PLATELET
Basophils Absolute: 0 10*3/uL (ref 0.0–0.1)
Basophils Relative: 0 %
Eosinophils Absolute: 0 10*3/uL (ref 0.0–0.7)
Eosinophils Relative: 0 %
HCT: 36.4 % (ref 36.0–46.0)
Hemoglobin: 12.7 g/dL (ref 12.0–15.0)
Lymphocytes Relative: 9 %
Lymphs Abs: 1.7 10*3/uL (ref 0.7–4.0)
MCH: 31.3 pg (ref 26.0–34.0)
MCHC: 34.9 g/dL (ref 30.0–36.0)
MCV: 89.7 fL (ref 78.0–100.0)
Monocytes Absolute: 0.3 10*3/uL (ref 0.1–1.0)
Monocytes Relative: 2 %
Neutro Abs: 15.8 10*3/uL — ABNORMAL HIGH (ref 1.7–7.7)
Neutrophils Relative %: 89 %
Platelets: 238 10*3/uL (ref 150–400)
RBC: 4.06 MIL/uL (ref 3.87–5.11)
RDW: 12.7 % (ref 11.5–15.5)
WBC: 17.7 10*3/uL — ABNORMAL HIGH (ref 4.0–10.5)

## 2015-06-07 LAB — URINALYSIS, ROUTINE W REFLEX MICROSCOPIC
Bilirubin Urine: NEGATIVE
Glucose, UA: NEGATIVE mg/dL
Hgb urine dipstick: NEGATIVE
Ketones, ur: >80 mg/dL — AB
Nitrite: NEGATIVE
Protein, ur: 30 mg/dL — AB
Specific Gravity, Urine: 1.02 (ref 1.005–1.030)
pH: 7 (ref 5.0–8.0)

## 2015-06-07 LAB — COMPREHENSIVE METABOLIC PANEL
ALT: 24 U/L (ref 14–54)
AST: 25 U/L (ref 15–41)
Albumin: 3.7 g/dL (ref 3.5–5.0)
Alkaline Phosphatase: 57 U/L (ref 38–126)
Anion gap: 10 (ref 5–15)
BUN: 9 mg/dL (ref 6–20)
CO2: 22 mmol/L (ref 22–32)
Calcium: 9.2 mg/dL (ref 8.9–10.3)
Chloride: 104 mmol/L (ref 101–111)
Creatinine, Ser: 0.49 mg/dL (ref 0.44–1.00)
GFR calc Af Amer: >60 mL/min (ref >60)
GFR calc non Af Amer: >60 mL/min (ref >60)
Glucose, Bld: 139 mg/dL — ABNORMAL HIGH (ref 65–99)
Potassium: 3.4 mmol/L — ABNORMAL LOW (ref 3.5–5.1)
Sodium: 136 mmol/L (ref 135–145)
Total Bilirubin: 0.5 mg/dL (ref 0.3–1.2)
Total Protein: 7.3 g/dL (ref 6.5–8.1)

## 2015-06-07 LAB — URINE MICROSCOPIC-ADD ON
Bacteria, UA: NONE SEEN
RBC / HPF: NONE SEEN RBC/hpf (ref 0–5)

## 2015-06-07 LAB — LIPASE, BLOOD: Lipase: 19 U/L (ref 11–51)

## 2015-06-07 MED ORDER — PROMETHAZINE HCL 25 MG/ML IJ SOLN
25.0000 mg | Freq: Once | INTRAMUSCULAR | Status: AC
  Administered 2015-06-07: 25 mg via INTRAVENOUS
  Filled 2015-06-07: qty 1

## 2015-06-07 MED ORDER — PROMETHAZINE HCL 25 MG/ML IJ SOLN
12.5000 mg | Freq: Once | INTRAMUSCULAR | Status: AC
  Administered 2015-06-07: 12.5 mg via INTRAMUSCULAR
  Filled 2015-06-07: qty 1

## 2015-06-07 MED ORDER — PROMETHAZINE HCL 25 MG PO TABS: 25.0000 mg | ORAL_TABLET | Freq: Four times a day (QID) | ORAL | Status: DC | PRN

## 2015-06-07 MED ORDER — SODIUM CHLORIDE 0.9 % IV SOLN
8.0000 mg | Freq: Once | INTRAVENOUS | Status: AC
  Administered 2015-06-07: 8 mg via INTRAVENOUS
  Filled 2015-06-07: qty 4

## 2015-06-07 MED ORDER — CALCIUM CARBONATE ANTACID 500 MG PO CHEW
2.0000 | CHEWABLE_TABLET | Freq: Once | ORAL | Status: AC
  Administered 2015-06-07: 400 mg via ORAL
  Filled 2015-06-07: qty 2

## 2015-06-07 MED ORDER — OMEPRAZOLE 20 MG PO CPDR: 20.0000 mg | DELAYED_RELEASE_CAPSULE | Freq: Every day | ORAL | Status: DC

## 2015-06-07 MED ORDER — SODIUM CHLORIDE FLUSH 0.9 % IV SOLN
INTRAVENOUS | Status: AC
  Filled 2015-06-07: qty 10

## 2015-06-07 MED ORDER — ONDANSETRON 4 MG PO TBDP: 4.0000 mg | ORAL_TABLET | Freq: Four times a day (QID) | ORAL | Status: DC | PRN

## 2015-06-07 MED ORDER — SODIUM CHLORIDE 0.9 % IV BOLUS (SEPSIS)
1000.0000 mL | Freq: Once | INTRAVENOUS | Status: AC
  Administered 2015-06-07: 1000 mL via INTRAVENOUS

## 2015-06-07 NOTE — MAU Note (Addendum)
C/o N&V and diarrhea for past 5 days; c/o intermittent, abdominal cramping for past week; has nausea medicine at home but it's not working; pt has had several bouts of N&V this pregnancy;

## 2015-06-07 NOTE — MAU Provider Note (Signed)
MAU HISTORY AND PHYSICAL  Chief Complaint:  Nausea; Emesis; Diarrhea; and Abdominal Pain   Bonnie Shepard is a 30 y.o.  G2P0010 with IUP at 6718w5d presenting for Nausea; Emesis; Diarrhea; and Abdominal Pain  Lots of n/v this pregnancy but had resolved for the past month. Now since Monday vomiting. Some lower abdominal cramping. Also with loose stools, no blood or mucous. Vomit is yellow. Took reglan at home but that didn't help. No vaginal bleeding or lof.   Past Medical History  Diagnosis Date  . Asthma   . Vaginal Pap smear, abnormal     f/u wnl  . Infection     freq UTI  . Acid reflux     Past Surgical History  Procedure Laterality Date  . Dilation and evacuation N/A 09/07/2013    Procedure: DILATATION AND EVACUATION;  Surgeon: Freddrick MarchKendra H. Tenny Crawoss, MD;  Location: [facility];  Service: Gynecology;  Laterality: N/A;  . Tonsillectomy      Family History  Problem Relation Age of Onset  . Hypertension Other   . Cancer Other   . Hypertension Mother   . Hypertension Father   . Hypertension Maternal Grandmother   . Heart disease Maternal Grandmother   . Hearing loss Neg Hx     Social History  Substance Use Topics  . Smoking status: Current Some Day Smoker -- 0.50 packs/day for 7 years    Types: Cigarettes  . Smokeless tobacco: Never Used  . Alcohol Use: No     Comment: occasionally    No Known Allergies  Prescriptions prior to admission  Medication Sig Dispense Refill Last Dose  . acetaminophen (TYLENOL) 500 MG tablet Take 500 mg by mouth every 6 (six) hours as needed for headache. Reported on 04/09/2015   06/06/2015 at Unknown time  . metoCLOPramide (REGLAN) 10 MG tablet Take 1 tablet (10 mg total) by mouth 4 (four) times daily -  before meals and at bedtime. 30 tablet 0 06/07/2015 at Unknown time  . Prenatal Vit-Fe Fumarate-FA (PRENATAL MULTIVITAMIN) TABS tablet Take 1 tablet by mouth daily at 12 noon.    06/06/2015 at Unknown time  . ranitidine (ZANTAC) 150 MG capsule Take 1 capsule (150  mg total) by mouth 2 (two) times daily. 60 capsule 0 06/06/2015 at Unknown time    Review of Systems - Negative except for what is mentioned in HPI.  Physical Exam  Blood pressure 132/88, pulse 78, temperature 97.5 F (36.4 C), temperature source Oral, resp. rate 20, last menstrual period 01/02/2015, unknown if currently breastfeeding. GENERAL: Well-developed, well-nourished female in no acute distress. MMs moist LUNGS: Clear to auscultation bilaterally.  HEART: Regular rate and rhythm. ABDOMEN: Soft, nontender, nondistended, gravid.  EXTREMITIES: Nontender, no edema, 2+ distal pulses.    Labs: Results for orders placed or performed during the hospital encounter of 06/07/15 (from the past 24 hour(s))  Urinalysis, Routine w reflex microscopic (not at [facility])   Collection Time: 06/07/15  1:56 PM  Result Value Ref Range   Color, Urine YELLOW YELLOW   APPearance CLOUDY (A) CLEAR   Specific Gravity, Urine 1.020 1.005 - 1.030   pH 7.0 5.0 - 8.0   Glucose, UA NEGATIVE NEGATIVE mg/dL   Hgb urine dipstick NEGATIVE NEGATIVE   Bilirubin Urine NEGATIVE NEGATIVE   Ketones, ur >80 (A) NEGATIVE mg/dL   Protein, ur 30 (A) NEGATIVE mg/dL   Nitrite NEGATIVE NEGATIVE   Leukocytes, UA TRACE (A) NEGATIVE  Urine microscopic-add on   Collection Time: 06/07/15  1:56 PM  Result Value Ref Range   Squamous Epithelial / LPF 0-5 (A) NONE SEEN   WBC, UA 0-5 0 - 5 WBC/hpf   RBC / HPF NONE SEEN 0 - 5 RBC/hpf   Bacteria, UA NONE SEEN NONE SEEN   Urine-Other AMORPHOUS URATES/PHOSPHATES   CBC with Differential/Platelet   Collection Time: 06/07/15  3:24 PM  Result Value Ref Range   WBC 17.7 (H) 4.0 - 10.5 K/uL   RBC 4.06 3.87 - 5.11 MIL/uL   Hemoglobin 12.7 12.0 - 15.0 g/dL   HCT 40.9 81.1 - 91.4 %   MCV 89.7 78.0 - 100.0 fL   MCH 31.3 26.0 - 34.0 pg   MCHC 34.9 30.0 - 36.0 g/dL   RDW 78.2 95.6 - 21.3 %   Platelets 238 150 - 400 K/uL   Neutrophils Relative % 89 %   Neutro Abs 15.8 (H) 1.7 - 7.7 K/uL    Lymphocytes Relative 9 %   Lymphs Abs 1.7 0.7 - 4.0 K/uL   Monocytes Relative 2 %   Monocytes Absolute 0.3 0.1 - 1.0 K/uL   Eosinophils Relative 0 %   Eosinophils Absolute 0.0 0.0 - 0.7 K/uL   Basophils Relative 0 %   Basophils Absolute 0.0 0.0 - 0.1 K/uL  Comprehensive metabolic panel   Collection Time: 06/07/15  3:24 PM  Result Value Ref Range   Sodium 136 135 - 145 mmol/L   Potassium 3.4 (L) 3.5 - 5.1 mmol/L   Chloride 104 101 - 111 mmol/L   CO2 22 22 - 32 mmol/L   Glucose, Bld 139 (H) 65 - 99 mg/dL   BUN 9 6 - 20 mg/dL   Creatinine, Ser 0.86 0.44 - 1.00 mg/dL   Calcium 9.2 8.9 - 57.8 mg/dL   Total Protein 7.3 6.5 - 8.1 g/dL   Albumin 3.7 3.5 - 5.0 g/dL   AST 25 15 - 41 U/L   ALT 24 14 - 54 U/L   Alkaline Phosphatase 57 38 - 126 U/L   Total Bilirubin 0.5 0.3 - 1.2 mg/dL   GFR calc non Af Amer >60 >60 mL/min   GFR calc Af Amer >60 >60 mL/min   Anion gap 10 5 - 15  Lipase, blood   Collection Time: 06/07/15  3:24 PM  Result Value Ref Range   Lipase 19 11 - 51 U/L    Imaging Studies:  [place] Mfm Ob Comp + 14 Wk  05/24/2015  OBSTETRICAL ULTRASOUND: This exam was performed within a [place] [facility]. The OB US report was generated in the [facility] system, and faxed to the ordering physician.  This report is available in the [employer]. See the [facility] Obstetric US report via the Image Link.   Assessment: Bonnie Shepard is  30 y.o. G2P0010 at [redacted]w[redacted]d presents with emesis. In terms of nv this could be resurgence of hyperemesis which has complicated pregnancy. Given loose stools could also represent a viral or toxin-mediated process. Some epigastric pain with vomiting, but no right-sided pain to suggest involvement of appendix, gallbladder, liver, or pancreas. CMP and lipase wnl. WBCs are elevated. UA not suggestive of infection. FHTs wnl (130s). Here given 2 L NS, zofran, and phenergan, with significant improvement of symptoms and passing PO challenge. Does complain of heart burn that  is not controlled with h2 blocker.  Plan: - zofran and phenergan prescriptions - push fluids - switch to omeprazole - close OB f/u - dehydration and abdominal pain return precautions  Silvano Bilis 6/2/20175:46 PM

## 2015-06-07 NOTE — Discharge Instructions (Signed)
Pick up medication from pharmacy and take as prescribed. Stay well hydrated

## 2015-06-07 NOTE — Discharge Instructions (Signed)
Hyperemesis Gravidarum  Hyperemesis gravidarum is a severe form of nausea and vomiting that happens during pregnancy. Hyperemesis is worse than morning sickness. It may cause you to have nausea or vomiting all day for many days. It may keep you from eating and drinking enough food and liquids. Hyperemesis usually occurs during the first half (the first 20 weeks) of pregnancy. It often goes away once a woman is in her second half of pregnancy. However, sometimes hyperemesis continues through an entire pregnancy.   CAUSES   The cause of this condition is not completely known but is thought to be related to changes in the body's hormones when pregnant. It could be from the high level of the pregnancy hormone or an increase in estrogen in the body.   SIGNS AND SYMPTOMS    Severe nausea and vomiting.   Nausea that does not go away.   Vomiting that does not allow you to keep any food down.   Weight loss and body fluid loss (dehydration).   Having no desire to eat or not liking food you have previously enjoyed.  DIAGNOSIS   Your health care provider will do a physical exam and ask you about your symptoms. He or she may also order blood tests and urine tests to make sure something else is not causing the problem.   TREATMENT   You may only need medicine to control the problem. If medicines do not control the nausea and vomiting, you will be treated in the hospital to prevent dehydration, increased acid in the blood (acidosis), weight loss, and changes in the electrolytes in your body that may harm the unborn baby (fetus). You may need IV fluids.   HOME CARE INSTRUCTIONS    Only take over-the-counter or prescription medicines as directed by your health care provider.   Try eating a couple of dry crackers or toast in the morning before getting out of bed.   Avoid foods and smells that upset your stomach.   Avoid fatty and spicy foods.   Eat 5-6 small meals a day.   Do not drink when eating meals. Drink between  meals.   For snacks, eat high-protein foods, such as cheese.   Eat or suck on things that have ginger in them. Ginger helps nausea.   Avoid food preparation. The smell of food can spoil your appetite.   Avoid iron pills and iron in your multivitamins until after 3-4 months of being pregnant. However, consult with your health care provider before stopping any prescribed iron pills.  SEEK MEDICAL CARE IF:    Your abdominal pain increases.   You have a severe headache.   You have vision problems.   You are losing weight.  SEEK IMMEDIATE MEDICAL CARE IF:    You are unable to keep fluids down.   You vomit blood.   You have constant nausea and vomiting.   You have excessive weakness.   You have extreme thirst.   You have dizziness or fainting.   You have a fever or persistent symptoms for more than 2-3 days.   You have a fever and your symptoms suddenly get worse.  MAKE SURE YOU:    Understand these instructions.   Will watch your condition.   Will get help right away if you are not doing well or get worse.     This information is not intended to replace advice given to you by your health care provider. Make sure you discuss any questions you have with   your health care provider.     Document Released: 12/22/2004 Document Revised: 10/12/2012 Document Reviewed: 08/03/2012  Elsevier Interactive Patient Education 2016 Elsevier Inc.

## 2015-06-07 NOTE — OB Triage Note (Signed)
Pt recvd from ED c/o vomiting all day. [facility]. Was given medications to help at time of d/c but did not get them filled yet.

## 2015-06-10 ENCOUNTER — Telehealth: Payer: Self-pay | Admitting: *Deleted

## 2015-06-10 DIAGNOSIS — O219 Vomiting of pregnancy, unspecified: Secondary | ICD-10-CM

## 2015-06-10 MED ORDER — PROMETHAZINE HCL 25 MG RE SUPP: 25.0000 mg | Freq: Four times a day (QID) | RECTAL | Status: DC | PRN

## 2015-06-10 NOTE — Telephone Encounter (Signed)
Bonnie Shepard called and left a message this am stating she has been sick all weekend and went to ER and has zofran and phenergan but nothing is helping.   States she hasn't been able to keep anything down.  Also states she thinks the phenergan is making her heartbeat fast .  I called Bonnie Shepard and she states she wasn't able to get her prescriptions filled before pharmacy closed and so she had went to [facility] because was so sick- they gave her phenergan shot. States since then she has been able to drink pedialyte and pops but she always throws it back up- so she really hasn't kept anything down in 2-3 days. She states she feels thirsty but states she is not dizzy or lightheaded unless she gets up too fast. I advised her that It sounds like she is dehydrated - I advised her to come to MaU today for evaluation as she may need IV fluids. She states her mom is making her a ginger broth and she is going to try that and the suppositories- but states understands if she can not keep anything down today needs to come to MAU.  I discussed with Dr. Ashok PallWouk her complaint of heart beat faster after phenergan- we discussed and and I informed patient can not say it doesn't- but may be because of dehydration. I advised her to come to MAU if she can't keep the broth down today or if starts feeling dizzy or lightheaded. I also advised her to stop phenergan if it was making her heart beat faster. She voices understanding.

## 2015-06-11 ENCOUNTER — Inpatient Hospital Stay (HOSPITAL_COMMUNITY)
Admission: AD | Admit: 2015-06-11 | Discharge: 2015-06-12 | DRG: 781 | Disposition: A | Discharge status: Home / Self Care | Payer: Medicaid Other | Source: Ambulatory Visit | Attending: Obstetrics and Gynecology | Admitting: Obstetrics and Gynecology

## 2015-06-11 ENCOUNTER — Encounter (HOSPITAL_COMMUNITY): Payer: Self-pay

## 2015-06-11 ENCOUNTER — Inpatient Hospital Stay (HOSPITAL_COMMUNITY): Payer: Medicaid Other

## 2015-06-11 DIAGNOSIS — O99332 Smoking (tobacco) complicating pregnancy, second trimester: Secondary | ICD-10-CM | POA: Diagnosis present

## 2015-06-11 DIAGNOSIS — Z79899 Other long term (current) drug therapy: Secondary | ICD-10-CM

## 2015-06-11 DIAGNOSIS — O211 Hyperemesis gravidarum with metabolic disturbance: Secondary | ICD-10-CM | POA: Diagnosis present

## 2015-06-11 DIAGNOSIS — E876 Hypokalemia: Secondary | ICD-10-CM | POA: Diagnosis present

## 2015-06-11 DIAGNOSIS — F17210 Nicotine dependence, cigarettes, uncomplicated: Secondary | ICD-10-CM | POA: Diagnosis present

## 2015-06-11 DIAGNOSIS — K5900 Constipation, unspecified: Secondary | ICD-10-CM | POA: Diagnosis present

## 2015-06-11 DIAGNOSIS — R7309 Other abnormal glucose: Secondary | ICD-10-CM

## 2015-06-11 DIAGNOSIS — Z3A21 21 weeks gestation of pregnancy: Secondary | ICD-10-CM | POA: Diagnosis not present

## 2015-06-11 DIAGNOSIS — Z3482 Encounter for supervision of other normal pregnancy, second trimester: Secondary | ICD-10-CM

## 2015-06-11 DIAGNOSIS — K219 Gastro-esophageal reflux disease without esophagitis: Secondary | ICD-10-CM | POA: Diagnosis present

## 2015-06-11 DIAGNOSIS — O09292 Supervision of pregnancy with other poor reproductive or obstetric history, second trimester: Secondary | ICD-10-CM

## 2015-06-11 DIAGNOSIS — R1110 Vomiting, unspecified: Secondary | ICD-10-CM

## 2015-06-11 LAB — URINE MICROSCOPIC-ADD ON
Bacteria, UA: NONE SEEN
RBC / HPF: NONE SEEN RBC/hpf (ref 0–5)

## 2015-06-11 LAB — COMPREHENSIVE METABOLIC PANEL
ALT: 95 U/L — ABNORMAL HIGH (ref 14–54)
AST: 65 U/L — ABNORMAL HIGH (ref 15–41)
Albumin: 3.8 g/dL (ref 3.5–5.0)
Alkaline Phosphatase: 67 U/L (ref 38–126)
Anion gap: 12 (ref 5–15)
BUN: 10 mg/dL (ref 6–20)
CO2: 36 mmol/L — ABNORMAL HIGH (ref 22–32)
Calcium: 10.5 mg/dL — ABNORMAL HIGH (ref 8.9–10.3)
Chloride: 83 mmol/L — ABNORMAL LOW (ref 101–111)
Creatinine, Ser: 0.68 mg/dL (ref 0.44–1.00)
GFR calc Af Amer: >60 mL/min (ref >60)
GFR calc non Af Amer: >60 mL/min (ref >60)
Glucose, Bld: 105 mg/dL — ABNORMAL HIGH (ref 65–99)
Potassium: 2.1 mmol/L — CL (ref 3.5–5.1)
Sodium: 131 mmol/L — ABNORMAL LOW (ref 135–145)
Total Bilirubin: 1 mg/dL (ref 0.3–1.2)
Total Protein: 7.7 g/dL (ref 6.5–8.1)

## 2015-06-11 LAB — MAGNESIUM: Magnesium: 1.3 mg/dL — ABNORMAL LOW (ref 1.7–2.4)

## 2015-06-11 LAB — LIPASE, BLOOD: Lipase: 21 U/L (ref 11–51)

## 2015-06-11 LAB — CBC
HCT: 40.4 % (ref 36.0–46.0)
Hemoglobin: 14.7 g/dL (ref 12.0–15.0)
MCH: 31.6 pg (ref 26.0–34.0)
MCHC: 36.4 g/dL — ABNORMAL HIGH (ref 30.0–36.0)
MCV: 86.9 fL (ref 78.0–100.0)
Platelets: 297 10*3/uL (ref 150–400)
RBC: 4.65 MIL/uL (ref 3.87–5.11)
RDW: 12.6 % (ref 11.5–15.5)
WBC: 14 10*3/uL — ABNORMAL HIGH (ref 4.0–10.5)

## 2015-06-11 LAB — TYPE AND SCREEN
ABO/RH(D): O POS
Antibody Screen: NEGATIVE

## 2015-06-11 LAB — URINALYSIS, ROUTINE W REFLEX MICROSCOPIC
Glucose, UA: NEGATIVE mg/dL
Ketones, ur: >80 mg/dL — AB
Nitrite: NEGATIVE
Protein, ur: 100 mg/dL — AB
Specific Gravity, Urine: 1.015 (ref 1.005–1.030)
pH: 8.5 — ABNORMAL HIGH (ref 5.0–8.0)

## 2015-06-11 MED ORDER — MAGNESIUM SULFATE 2 GM/50ML IV SOLN
2.0000 g | Freq: Once | INTRAVENOUS | Status: DC
  Filled 2015-06-11: qty 50

## 2015-06-11 MED ORDER — PANTOPRAZOLE SODIUM 40 MG PO TBEC
40.0000 mg | DELAYED_RELEASE_TABLET | Freq: Every day | ORAL | Status: DC
  Administered 2015-06-11: 40 mg via ORAL
  Filled 2015-06-11: qty 1

## 2015-06-11 MED ORDER — SODIUM CHLORIDE 0.9 % IV BOLUS (SEPSIS)
1000.0000 mL | Freq: Once | INTRAVENOUS | Status: AC
  Administered 2015-06-11: 1000 mL via INTRAVENOUS

## 2015-06-11 MED ORDER — POTASSIUM CHLORIDE 10 MEQ/100ML IV SOLN
10.0000 meq | INTRAVENOUS | Status: AC
  Administered 2015-06-11 – 2015-06-12 (×6): 10 meq via INTRAVENOUS
  Filled 2015-06-11 (×6): qty 100

## 2015-06-11 MED ORDER — ONDANSETRON HCL 4 MG/2ML IJ SOLN
4.0000 mg | Freq: Once | INTRAMUSCULAR | Status: AC
  Administered 2015-06-11: 4 mg via INTRAVENOUS
  Filled 2015-06-11: qty 2

## 2015-06-11 MED ORDER — ACETAMINOPHEN 325 MG PO TABS: 650.0000 mg | ORAL_TABLET | ORAL | Status: DC | PRN

## 2015-06-11 MED ORDER — DOCUSATE SODIUM 100 MG PO CAPS: 100.0000 mg | ORAL_CAPSULE | Freq: Every day | ORAL | Status: DC

## 2015-06-11 MED ORDER — METOCLOPRAMIDE HCL 5 MG/ML IJ SOLN
10.0000 mg | Freq: Three times a day (TID) | INTRAMUSCULAR | Status: DC
  Administered 2015-06-11: 10 mg via INTRAVENOUS
  Filled 2015-06-11: qty 2

## 2015-06-11 MED ORDER — CALCIUM CARBONATE ANTACID 500 MG PO CHEW: 2.0000 | CHEWABLE_TABLET | ORAL | Status: DC | PRN

## 2015-06-11 MED ORDER — ZOLPIDEM TARTRATE 5 MG PO TABS: 5.0000 mg | ORAL_TABLET | Freq: Every evening | ORAL | Status: DC | PRN

## 2015-06-11 MED ORDER — METOCLOPRAMIDE HCL 5 MG/ML IJ SOLN
10.0000 mg | Freq: Once | INTRAMUSCULAR | Status: AC
  Administered 2015-06-11: 10 mg via INTRAVENOUS
  Filled 2015-06-11: qty 2

## 2015-06-11 MED ORDER — ONDANSETRON HCL 40 MG/20ML IJ SOLN: 8.0000 mg | Freq: Four times a day (QID) | INTRAMUSCULAR | Status: DC

## 2015-06-11 MED ORDER — SODIUM CHLORIDE 0.9 % IV SOLN
INTRAVENOUS | Status: DC
  Administered 2015-06-11: 17:00:00 via INTRAVENOUS

## 2015-06-11 MED ORDER — ONDANSETRON HCL 4 MG/2ML IJ SOLN: 4.0000 mg | Freq: Four times a day (QID) | INTRAMUSCULAR | Status: DC

## 2015-06-11 MED ORDER — POTASSIUM CHLORIDE CRYS ER 20 MEQ PO TBCR
40.0000 meq | EXTENDED_RELEASE_TABLET | ORAL | Status: DC
  Administered 2015-06-11: 40 meq via ORAL
  Filled 2015-06-11 (×4): qty 2

## 2015-06-11 MED ORDER — MAGNESIUM SULFATE 50 % IJ SOLN
2.0000 g | Freq: Once | INTRAVENOUS | Status: AC
  Administered 2015-06-11: 2 g via INTRAVENOUS
  Filled 2015-06-11: qty 4

## 2015-06-11 MED ORDER — PRENATAL MULTIVITAMIN CH: 1.0000 | ORAL_TABLET | Freq: Every day | ORAL | Status: DC

## 2015-06-11 MED ORDER — POTASSIUM CHLORIDE CRYS ER 20 MEQ PO TBCR
40.0000 meq | EXTENDED_RELEASE_TABLET | ORAL | Status: AC
  Administered 2015-06-12 (×2): 40 meq via ORAL
  Filled 2015-06-11 (×3): qty 2

## 2015-06-11 NOTE — MAU Provider Note (Signed)
MAU HISTORY AND PHYSICAL  Chief Complaint:  Emesis, presyncope  Bonnie Shepard is a 30 y.o.  G2P0010 with IUP at 6464w2d presenting for the above  Two weeks of worsening nausea/vomiting. No abdominal pain. Last stooled 4 days ago. Thinks maybe passed gas once in past 4 days. Constipation throughout pregnancy. No fevers, no dysuria. Has pheneran and zofran at home. Took phenergan suppository at 6 am this morning.   No contractions, no bleeding, no lof.     Past Medical History  Diagnosis Date  . Asthma   . Vaginal Pap smear, abnormal     f/u wnl  . Infection     freq UTI  . Acid reflux     Past Surgical History  Procedure Laterality Date  . Dilation and evacuation N/A 09/07/2013    Procedure: DILATATION AND EVACUATION;  Surgeon: Freddrick MarchKendra H. Tenny Crawoss, MD;  Location: [facility];  Service: Gynecology;  Laterality: N/A;  . Tonsillectomy      Family History  Problem Relation Age of Onset  . Hypertension Other   . Cancer Other   . Hypertension Mother   . Hypertension Father   . Hypertension Maternal Grandmother   . Heart disease Maternal Grandmother   . Hearing loss Neg Hx     Social History  Substance Use Topics  . Smoking status: Current Some Day Smoker -- 0.50 packs/day for 7 years    Types: Cigarettes  . Smokeless tobacco: Never Used  . Alcohol Use: No     Comment: occasionally    No Known Allergies  Prescriptions prior to admission  Medication Sig Dispense Refill Last Dose  . acetaminophen (TYLENOL) 500 MG tablet Take 500 mg by mouth every 6 (six) hours as needed for headache. Reported on 04/09/2015   06/10/2015 at Unknown time  . ondansetron (ZOFRAN ODT) 4 MG disintegrating tablet Take 1 tablet (4 mg total) by mouth every 6 (six) hours as needed for nausea. 30 tablet 2 06/11/2015 at Unknown time  . Prenatal Vit-Fe Fumarate-FA (PRENATAL MULTIVITAMIN) TABS tablet Take 1 tablet by mouth daily at 12 noon.    Past Week at Unknown time  . promethazine (PHENERGAN) 25 MG tablet Take 1 tablet (25  mg total) by mouth every 6 (six) hours as needed for nausea or vomiting. 30 tablet 2 06/11/2015 at Unknown time  . ranitidine (ZANTAC) 150 MG tablet Take 150 mg by mouth 2 (two) times daily.   06/11/2015 at Unknown time  . omeprazole (PRILOSEC) 20 MG capsule Take 1 capsule (20 mg total) by mouth daily. (Patient not taking: Reported on 06/07/2015) 30 capsule 2 Not Taking at Unknown time  . promethazine (PHENERGAN) 25 MG suppository Place 1 suppository (25 mg total) rectally every 6 (six) hours as needed for nausea or vomiting. (Patient not taking: Reported on 06/11/2015) 12 each 0     Review of Systems - Negative except for what is mentioned in HPI.  Physical Exam  Blood pressure 135/77, pulse 102, temperature 98.6 F (37 C), temperature source Oral, resp. rate 18, height 5\' 2"  (1.575 m), weight 195 lb (88.451 kg), last menstrual period 01/02/2015, SpO2 100 %, unknown if currently breastfeeding.    Labs: Results for orders placed or performed during the hospital encounter of 06/11/15 (from the past 24 hour(s))  Urinalysis, Routine w reflex microscopic (not at [facility])   Collection Time: 06/11/15 12:40 PM  Result Value Ref Range   Color, Urine YELLOW YELLOW   APPearance CLOUDY (A) CLEAR   Specific Gravity, Urine 1.015  1.005 - 1.030   pH 8.5 (H) 5.0 - 8.0   Glucose, UA NEGATIVE NEGATIVE mg/dL   Hgb urine dipstick TRACE (A) NEGATIVE   Bilirubin Urine SMALL (A) NEGATIVE   Ketones, ur >80 (A) NEGATIVE mg/dL   Protein, ur 295 (A) NEGATIVE mg/dL   Nitrite NEGATIVE NEGATIVE   Leukocytes, UA SMALL (A) NEGATIVE  Urine microscopic-add on   Collection Time: 06/11/15 12:40 PM  Result Value Ref Range   Squamous Epithelial / LPF TOO NUMEROUS TO COUNT (A) NONE SEEN   WBC, UA 0-5 0 - 5 WBC/hpf   RBC / HPF NONE SEEN 0 - 5 RBC/hpf   Bacteria, UA NONE SEEN NONE SEEN   Urine-Other AMORPHOUS URATES/PHOSPHATES     Imaging Studies:  [place] Mfm Ob Comp + 14 Wk  05/24/2015  OBSTETRICAL ULTRASOUND: This exam was  performed within a [facility] Ul[facility]ound Department. The OB US report was generated in the AS system, and faxed to the ordering physician.  This report is available in the [employer]. See the AS Obstetric US report via the Image Link.   Assessment: Bonnie Shepard is  30 y.o. G2P0010 at [redacted]w[redacted]d presents with Emesis During Pregnancy and Loss of Consciousness  Admit to antenatal. Please see admission h & p   Anette Riedel B Chrissy Ealey 6/6/20172:25 PM

## 2015-06-11 NOTE — H&P (Signed)
ANTEPARTUM ADMISSION HISTORY AND PHYSICAL NOTE   History of Present Illness: Bonnie Shepard is a 30 y.o. G2P0010 at 7958w2d presenting with 3 weeks worsening n/v despite use of phenergan and zofran at home. Multiple episodes every day. Tolerating some fluids. No abdominal pain, no fevers. Felt like wanted to faint earlier today. No lof, bleeding, or contractions.   Patient Active Problem List   Diagnosis Date Noted  . Hypokalemia 06/11/2015  . Pregnancy 06/07/2015  . Glucose tolerance test abnormal 04/10/2015  . Supervision of normal pregnancy, antepartum 04/09/2015  . Prior perinatal loss in second trimester, antepartum 04/09/2015    Past Medical History  Diagnosis Date  . Asthma   . Vaginal Pap smear, abnormal     f/u wnl  . Infection     freq UTI  . Acid reflux     Past Surgical History  Procedure Laterality Date  . Dilation and evacuation N/A 09/07/2013    Procedure: DILATATION AND EVACUATION;  Surgeon: Freddrick MarchKendra H. Tenny Crawoss, MD;  Location: [facility];  Service: Gynecology;  Laterality: N/A;  . Tonsillectomy      OB History  Gravida Para Term Preterm AB SAB TAB Ectopic Multiple Living  2    1         # Outcome Date GA Lbr Len/2nd Weight Sex Delivery Anes PTL Lv  2 Current           1 AB  233w0d    SAB         Social History   Social History  . Marital Status: Single    Spouse Name: N/A  . Number of Children: N/A  . Years of Education: N/A   Social History Main Topics  . Smoking status: Current Some Day Smoker -- 0.50 packs/day for 7 years    Types: Cigarettes  . Smokeless tobacco: Never Used  . Alcohol Use: No     Comment: occasionally  . Drug Use: No  . Sexual Activity: Yes   Other Topics Concern  . None   Social History Narrative    Family History  Problem Relation Age of Onset  . Hypertension Other   . Cancer Other   . Hypertension Mother   . Hypertension Father   . Hypertension Maternal Grandmother   . Heart disease Maternal Grandmother   . Hearing loss  Neg Hx     No Known Allergies  Prescriptions prior to admission  Medication Sig Dispense Refill Last Dose  . acetaminophen (TYLENOL) 500 MG tablet Take 500 mg by mouth every 6 (six) hours as needed for headache. Reported on 04/09/2015   06/10/2015 at Unknown time  . ondansetron (ZOFRAN ODT) 4 MG disintegrating tablet Take 1 tablet (4 mg total) by mouth every 6 (six) hours as needed for nausea. 30 tablet 2 06/11/2015 at Unknown time  . Prenatal Vit-Fe Fumarate-FA (PRENATAL MULTIVITAMIN) TABS tablet Take 1 tablet by mouth daily at 12 noon.    Past Week at Unknown time  . promethazine (PHENERGAN) 25 MG tablet Take 1 tablet (25 mg total) by mouth every 6 (six) hours as needed for nausea or vomiting. 30 tablet 2 06/11/2015 at Unknown time  . ranitidine (ZANTAC) 150 MG tablet Take 150 mg by mouth 2 (two) times daily.   06/11/2015 at Unknown time  . omeprazole (PRILOSEC) 20 MG capsule Take 1 capsule (20 mg total) by mouth daily. (Patient not taking: Reported on 06/07/2015) 30 capsule 2 Not Taking at Unknown time  . promethazine (PHENERGAN) 25 MG suppository  Place 1 suppository (25 mg total) rectally every 6 (six) hours as needed for nausea or vomiting. (Patient not taking: Reported on 06/11/2015) 12 each 0     Review of Systems - Negative except as per hpi  Vitals:  BP 135/77 mmHg  Pulse 102  Temp(Src) 98.6 F (37 C) (Oral)  Resp 18  Ht 5\' 2"  (1.575 m)  Wt 195 lb (88.451 kg)  BMI 35.66 kg/m2  SpO2 100%  LMP 01/02/2015 Physical Examination: CONSTITUTIONAL: Well-developed, well-nourished female in no acute distress.  HENT:  Normocephalic, atraumatic, External right and left ear normal. Oropharynx is clear and moist EYES: Conjunctivae and EOM are normal. Pupils are equal, round, and reactive to light. No scleral icterus.  NECK: Normal range of motion, supple, no masses SKIN: Skin is warm and dry. No rash noted. Not diaphoretic. No erythema. No pallor. NEUROLGIC: Alert and oriented to person, place, and  time. Normal reflexes, muscle tone coordination. No cranial nerve deficit noted. PSYCHIATRIC: Normal mood and affect. Normal behavior. Normal judgment and thought content. CARDIOVASCULAR: Normal heart rate noted, regular rhythm RESPIRATORY: Effort and breath sounds normal, no problems with respiration noted ABDOMEN: Soft, nontender, nondistended, gravid. MUSCULOSKELETAL: Normal range of motion. No edema and no tenderness. 2+ distal pulses.  Cervix: not assessed Membranes: intact Fetal Monitoring: 150s Tocometer: Flat  Labs:  Results for orders placed or performed during the hospital encounter of 06/11/15 (from the past 24 hour(s))  Urinalysis, Routine w reflex microscopic (not at [facility])   Collection Time: 06/11/15 12:40 PM  Result Value Ref Range   Color, Urine YELLOW YELLOW   APPearance CLOUDY (A) CLEAR   Specific Gravity, Urine 1.015 1.005 - 1.030   pH 8.5 (H) 5.0 - 8.0   Glucose, UA NEGATIVE NEGATIVE mg/dL   Hgb urine dipstick TRACE (A) NEGATIVE   Bilirubin Urine SMALL (A) NEGATIVE   Ketones, ur >80 (A) NEGATIVE mg/dL   Protein, ur 161 (A) NEGATIVE mg/dL   Nitrite NEGATIVE NEGATIVE   Leukocytes, UA SMALL (A) NEGATIVE  Urine microscopic-add on   Collection Time: 06/11/15 12:40 PM  Result Value Ref Range   Squamous Epithelial / LPF TOO NUMEROUS TO COUNT (A) NONE SEEN   WBC, UA 0-5 0 - 5 WBC/hpf   RBC / HPF NONE SEEN 0 - 5 RBC/hpf   Bacteria, UA NONE SEEN NONE SEEN   Urine-Other AMORPHOUS URATES/PHOSPHATES   Comprehensive metabolic panel   Collection Time: 06/11/15  2:28 PM  Result Value Ref Range   Sodium 131 (L) 135 - 145 mmol/L   Potassium 2.1 (LL) 3.5 - 5.1 mmol/L   Chloride 83 (L) 101 - 111 mmol/L   CO2 36 (H) 22 - 32 mmol/L   Glucose, Bld 105 (H) 65 - 99 mg/dL   BUN 10 6 - 20 mg/dL   Creatinine, Ser 0.96 0.44 - 1.00 mg/dL   Calcium 04.5 (H) 8.9 - 10.3 mg/dL   Total Protein 7.7 6.5 - 8.1 g/dL   Albumin 3.8 3.5 - 5.0 g/dL   AST 65 (H) 15 - 41 U/L   ALT 95 (H) 14 -  54 U/L   Alkaline Phosphatase 67 38 - 126 U/L   Total Bilirubin 1.0 0.3 - 1.2 mg/dL   GFR calc non Af Amer >60 >60 mL/min   GFR calc Af Amer >60 >60 mL/min   Anion gap 12 5 - 15  CBC   Collection Time: 06/11/15  2:28 PM  Result Value Ref Range   WBC 14.0 (H) 4.0 -  10.5 K/uL   RBC 4.65 3.87 - 5.11 MIL/uL   Hemoglobin 14.7 12.0 - 15.0 g/dL   HCT 82.9 56.2 - 13.0 %   MCV 86.9 78.0 - 100.0 fL   MCH 31.6 26.0 - 34.0 pg   MCHC 36.4 (H) 30.0 - 36.0 g/dL   RDW 86.5 78.4 - 69.6 %   Platelets 297 150 - 400 K/uL  Lipase, blood   Collection Time: 06/11/15  2:28 PM  Result Value Ref Range   Lipase 21 11 - 51 U/L  Type and screen   Collection Time: 06/11/15  2:28 PM  Result Value Ref Range   ABO/RH(D) O POS    Antibody Screen NEG    Sample Expiration 06/14/2015     Imaging Studies: Dg Abd 1 View  06/11/2015  CLINICAL DATA:  30 year old female with vomiting for 2 weeks and constipation for 4 days. Patient is pregnant. EXAM: ABDOMEN - 1 VIEW COMPARISON:  None. FINDINGS: The bowel gas pattern is unremarkable with moderate gas within the colon. No dilated small bowel loops are present. No suspicious calcifications are identified. No acute bony abnormalities are present. Curvilinear calcification within the pelvis presumably represents gestation. IMPRESSION: Unremarkable bowel gas pattern. Electronically Signed   By: Harmon Pier M.D.   On: 06/11/2015 15:18   [place] Mfm Ob Comp + 14 Wk  05/24/2015  OBSTETRICAL ULTRASOUND: This exam was performed within a [facility] Ultrasound Department. The OB US report was generated in the AS system, and faxed to the ordering physician.  This report is available in the [employer]. See the AS Obstetric US report via the Image Link.    Assessment and Plan: Patient Active Problem List   Diagnosis Date Noted  . Hypokalemia 06/11/2015  . Pregnancy 06/07/2015  . Glucose tolerance test abnormal 04/10/2015  . Supervision of normal pregnancy, antepartum 04/09/2015  .  Prior perinatal loss in second trimester, antepartum 04/09/2015   # Hypokalemia - severe, K 2.1, the primary reason for admission - f/u ECG - f/u MG - KCL 10 meq IV x6, kcl tablet 40 meq q4 hours x4 - repeat cmp/k in the AM. Mild transaminitis think likely 2/2 vomiting  # Hyperemesis - improved with reglan and zofran. Initially some concern for obstruction given pt report of no bm or flatus, but kub w/o signs obstructions, and abdomen soft - reglan 10 mg iv q8, zofran 4 mg IV q6 - clear diet  Admit to Antenatal Routine antenatal care  Silvano Bilis, MD Faculty Practice, [facility]

## 2015-06-11 NOTE — Progress Notes (Signed)
CRITICAL VALUE ALERT  Critical value received:  Potassium  Date of notification:  06/11/15 Time of notification:  1533  Critical value read back:Yes.    Nurse who received alert:  Sheffield Sliderhristy Wicker,RNC  MD notified (1st page):  Dr Ashok PallWouk  Time of first page:  1533  MD notified (2nd page):  Time of second page:  Responding MD:  Dr Ashok PallWouk  Time MD responded:  [phone]

## 2015-06-12 DIAGNOSIS — E876 Hypokalemia: Secondary | ICD-10-CM | POA: Diagnosis not present

## 2015-06-12 DIAGNOSIS — Z3A21 21 weeks gestation of pregnancy: Secondary | ICD-10-CM | POA: Diagnosis not present

## 2015-06-12 DIAGNOSIS — O211 Hyperemesis gravidarum with metabolic disturbance: Principal | ICD-10-CM

## 2015-06-12 LAB — COMPREHENSIVE METABOLIC PANEL
ALT: 111 U/L — ABNORMAL HIGH (ref 14–54)
AST: 76 U/L — ABNORMAL HIGH (ref 15–41)
Albumin: 2.9 g/dL — ABNORMAL LOW (ref 3.5–5.0)
Alkaline Phosphatase: 51 U/L (ref 38–126)
Anion gap: 5 (ref 5–15)
BUN: 8 mg/dL (ref 6–20)
CO2: 32 mmol/L (ref 22–32)
Calcium: 8.3 mg/dL — ABNORMAL LOW (ref 8.9–10.3)
Chloride: 98 mmol/L — ABNORMAL LOW (ref 101–111)
Creatinine, Ser: 0.62 mg/dL (ref 0.44–1.00)
GFR calc Af Amer: >60 mL/min (ref >60)
GFR calc non Af Amer: >60 mL/min (ref >60)
Glucose, Bld: 97 mg/dL (ref 65–99)
Potassium: 3.5 mmol/L (ref 3.5–5.1)
Sodium: 135 mmol/L (ref 135–145)
Total Bilirubin: 0.8 mg/dL (ref 0.3–1.2)
Total Protein: 5.5 g/dL — ABNORMAL LOW (ref 6.5–8.1)

## 2015-06-12 LAB — MAGNESIUM: Magnesium: 1.6 mg/dL — ABNORMAL LOW (ref 1.7–2.4)

## 2015-06-12 NOTE — Discharge Summary (Signed)
OB Discharge Summary     Patient Name: Bonnie Shepard DOB: 02/18/1985 MRN: [id number]  Date of admission: 06/11/2015 Delivering MD: This patient has no babies on file.  Date of discharge: 06/12/2015  Admitting diagnosis: 21 wks near syncope , hypokalemia Intrauterine pregnancy: [redacted]w[redacted]d     Secondary diagnosis:  Active Problems:   Hypokalemia     Discharge diagnosis: Hyperemesis gravidarum with hypokalemia now resolved                                                                                                 Hospital course:  30 yo G2P0010 at [redacted]w[redacted]d admitted secondary to near syncopal episode and hypokalemia (K 2.1) secondary to hyperemesis. Patient received potassium repletion. She was able to tolerate clear liquids. She reports good fetal movement. She denies cramping, vaginal bleeding or leakage of fluid. She was eager to be discharged this morning in order to return to work. Discharge instructions were provided. Patient has follow up appointment scheduled in June along with follow up anatomy ultrasound  Physical exam  Filed Vitals:   06/11/15 2112 06/12/15 0027 06/12/15 0139 06/12/15 0705  BP: 112/70 118/70 100/54 110/59  Pulse: 106 73 88 78  Temp: 98.5 F (36.9 C) 98.1 F (36.7 C) 97.8 F (36.6 C) 97.7 F (36.5 C)  TempSrc: Oral Oral Oral Oral  Resp: Height:      Weight:      SpO2:       Fetal heart tone via doppler: 150  General: alert, cooperative and no distress  Lungs:  Clear to auscultation bilaterally Heart: regular rate and rhythm Uterine Fundus: Gravid, consistent with gestational age DVT Evaluation: No evidence of DVT seen on physical exam. Labs: Lab Results  Component Value Date   WBC 14.0* 06/11/2015   HGB 14.7 06/11/2015   HCT 40.4 06/11/2015   MCV 86.9 06/11/2015   PLT 297 06/11/2015   CMP Latest Ref Rng 06/12/2015  Glucose 65 - 99 mg/dL 97  BUN 6 - 20 mg/dL 8  Creatinine 4.09 - 8.11 mg/dL 9.14  Sodium 782 - 956 mmol/L 135  Potassium  3.5 - 5.1 mmol/L 3.5  Chloride 101 - 111 mmol/L 98(L)  CO2 22 - 32 mmol/L 32  Calcium 8.9 - 10.3 mg/dL 8.3(L)  Total Protein 6.5 - 8.1 g/dL 2.1(H)  Total Bilirubin 0.3 - 1.2 mg/dL 0.8  Alkaline Phos 38 - 126 U/L 51  AST 15 - 41 U/L 76(H)  ALT 14 - 54 U/L 111(H)    Discharge instruction: per After Visit Summary and "Baby and Me Booklet".  After visit meds:    Medication List    STOP taking these medications        omeprazole 20 MG capsule  Commonly known as:  PRILOSEC      TAKE these medications        acetaminophen 500 MG tablet  Commonly known as:  TYLENOL  Take 500 mg by mouth every 6 (six) hours as needed for headache. Reported on 04/09/2015     ondansetron 4 MG disintegrating tablet  Commonly  known as:  ZOFRAN ODT  Take 1 tablet (4 mg total) by mouth every 6 (six) hours as needed for nausea.     prenatal multivitamin Tabs tablet  Take 1 tablet by mouth daily at 12 noon.     promethazine 25 MG tablet  Commonly known as:  PHENERGAN  Take 1 tablet (25 mg total) by mouth every 6 (six) hours as needed for nausea or vomiting.     ranitidine 150 MG tablet  Commonly known as:  ZANTAC  Take 150 mg by mouth 2 (two) times daily.        Diet: routine diet  Activity: Advance as tolerated.   Outpatient follow up:as scheduled Follow up Appt:Future Appointments Date Time Provider Department Center  06/25/2015 8:45 AM [facility] US 2 [facility]  07/03/2015 9:00 AM Marlis EdelsonWalidah N Karim, CNM [facility]      06/12/2015 Catalina AntiguaONSTANT,Darling Cieslewicz, MD

## 2015-06-12 NOTE — Discharge Instructions (Signed)
Hyperemesis Gravidarum  Hyperemesis gravidarum is a severe form of nausea and vomiting that happens during pregnancy. Hyperemesis is worse than morning sickness. It may cause you to have nausea or vomiting all day for many days. It may keep you from eating and drinking enough food and liquids. Hyperemesis usually occurs during the first half (the first 20 weeks) of pregnancy. It often goes away once a woman is in her second half of pregnancy. However, sometimes hyperemesis continues through an entire pregnancy.   CAUSES   The cause of this condition is not completely known but is thought to be related to changes in the body's hormones when pregnant. It could be from the high level of the pregnancy hormone or an increase in estrogen in the body.   SIGNS AND SYMPTOMS    Severe nausea and vomiting.   Nausea that does not go away.   Vomiting that does not allow you to keep any food down.   Weight loss and body fluid loss (dehydration).   Having no desire to eat or not liking food you have previously enjoyed.  DIAGNOSIS   Your health care provider will do a physical exam and ask you about your symptoms. He or she may also order blood tests and urine tests to make sure something else is not causing the problem.   TREATMENT   You may only need medicine to control the problem. If medicines do not control the nausea and vomiting, you will be treated in the hospital to prevent dehydration, increased acid in the blood (acidosis), weight loss, and changes in the electrolytes in your body that may harm the unborn baby (fetus). You may need IV fluids.   HOME CARE INSTRUCTIONS    Only take over-the-counter or prescription medicines as directed by your health care provider.   Try eating a couple of dry crackers or toast in the morning before getting out of bed.   Avoid foods and smells that upset your stomach.   Avoid fatty and spicy foods.   Eat 5-6 small meals a day.   Do not drink when eating meals. Drink between  meals.   For snacks, eat high-protein foods, such as cheese.   Eat or suck on things that have ginger in them. Ginger helps nausea.   Avoid food preparation. The smell of food can spoil your appetite.   Avoid iron pills and iron in your multivitamins until after 3-4 months of being pregnant. However, consult with your health care provider before stopping any prescribed iron pills.  SEEK MEDICAL CARE IF:    Your abdominal pain increases.   You have a severe headache.   You have vision problems.   You are losing weight.  SEEK IMMEDIATE MEDICAL CARE IF:    You are unable to keep fluids down.   You vomit blood.   You have Bonnie Shepard nausea and vomiting.   You have excessive weakness.   You have extreme thirst.   You have dizziness or fainting.   You have a fever or persistent symptoms for more than 2-3 days.   You have a fever and your symptoms suddenly get worse.  MAKE SURE YOU:    Understand these instructions.   Will watch your condition.   Will get help right away if you are not doing well or get worse.     This information is not intended to replace advice given to you by your health care provider. Make sure you discuss any questions you have with   your health care provider.     Document Released: 12/22/2004 Document Revised: 10/12/2012 Document Reviewed: 08/03/2012  Elsevier Interactive Patient Education 2016 [employer]

## 2015-06-25 ENCOUNTER — Ambulatory Visit (HOSPITAL_COMMUNITY)
Admission: RE | Admit: 2015-06-25 | Discharge: 2015-06-25 | Disposition: A | Discharge status: Home / Self Care | Payer: Medicaid Other | Source: Ambulatory Visit | Attending: Student | Admitting: Student

## 2015-06-25 DIAGNOSIS — Z36 Encounter for antenatal screening of mother: Secondary | ICD-10-CM | POA: Diagnosis not present

## 2015-06-25 DIAGNOSIS — Z3A23 23 weeks gestation of pregnancy: Secondary | ICD-10-CM | POA: Insufficient documentation

## 2015-06-25 DIAGNOSIS — Z3482 Encounter for supervision of other normal pregnancy, second trimester: Secondary | ICD-10-CM

## 2015-07-03 ENCOUNTER — Other Ambulatory Visit (HOSPITAL_COMMUNITY)
Admission: RE | Admit: 2015-07-03 | Discharge: 2015-07-03 | Disposition: A | Discharge status: Home / Self Care | Payer: Medicaid Other | Source: Ambulatory Visit | Attending: Family | Admitting: Family

## 2015-07-03 ENCOUNTER — Ambulatory Visit (INDEPENDENT_AMBULATORY_CARE_PROVIDER_SITE_OTHER): Payer: Medicaid Other | Admitting: Student

## 2015-07-03 VITALS — BP 112/78 | HR 70 | Wt 204.7 lb

## 2015-07-03 DIAGNOSIS — R519 Headache, unspecified: Secondary | ICD-10-CM

## 2015-07-03 DIAGNOSIS — O26892 Other specified pregnancy related conditions, second trimester: Secondary | ICD-10-CM | POA: Diagnosis not present

## 2015-07-03 DIAGNOSIS — Z113 Encounter for screening for infections with a predominantly sexual mode of transmission: Secondary | ICD-10-CM | POA: Insufficient documentation

## 2015-07-03 DIAGNOSIS — Z3482 Encounter for supervision of other normal pregnancy, second trimester: Secondary | ICD-10-CM

## 2015-07-03 DIAGNOSIS — R51 Headache: Secondary | ICD-10-CM

## 2015-07-03 LAB — POCT URINALYSIS DIP (DEVICE)
Bilirubin Urine: NEGATIVE
Glucose, UA: NEGATIVE mg/dL
Nitrite: NEGATIVE
Protein, ur: NEGATIVE mg/dL
Specific Gravity, Urine: 1.025 (ref 1.005–1.030)
Urobilinogen, UA: 0.2 mg/dL (ref 0.0–1.0)
pH: 6.5 (ref 5.0–8.0)

## 2015-07-03 MED ORDER — BUTALBITAL-APAP-CAFFEINE 50-325-40 MG PO TABS: 2.0000 | ORAL_TABLET | Freq: Four times a day (QID) | ORAL | Status: DC | PRN

## 2015-07-03 NOTE — Progress Notes (Deleted)
Vomited here Headaches at home.  Goes away after 2 es tylenol

## 2015-07-03 NOTE — Progress Notes (Signed)
Subjective:  Bonnie Shepard is a 30 y.o. G2P0010 at 691w3d being seen today for ongoing prenatal care.  She is currently monitored for the following issues for this high-risk pregnancy and has Supervision of normal pregnancy, antepartum; Prior perinatal loss in second trimester, antepartum; Glucose tolerance test abnormal; Pregnancy; and Hypokalemia on her problem list.  Patient reports headache. Reports daily headaches for the last week. Takes 2 ES tylenol for headache & it resolved. Denies any associated symptoms with headache. Reports slight cough for the last few days; denies fever, PND, sore throat, sinus pain. Vomited once today; no nausea, diarrhea, or sick contacts. Contractions: Not present. Denies leaking of fluid.   The following portions of the patient's history were reviewed and updated as appropriate: allergies, current medications, past family history, past medical history, past social history, past surgical history and problem list. Problem list updated.  Objective:   Filed Vitals:   07/03/15 1002  BP: 112/78  Pulse: 70  Weight: 204 lb 11.2 oz (92.851 kg)    Fetal Status: Fetal Heart Rate (bpm): 136 Fundal Height: 25 cm       General:  Alert, oriented and cooperative. Patient is in no acute distress.  Skin: Skin is warm and dry. No rash noted.   Cardiovascular: Normal heart rate noted  Respiratory: Normal respiratory effort, no problems with respiration noted  Abdomen: Soft, gravid, appropriate for gestational age. Pain/Pressure: Present     Pelvic: Cervical exam deferred        Extremities: Normal range of motion.  Edema: None  Mental Status: Normal mood and affect. Normal behavior. Normal judgment and thought content.   Urinalysis: Urine Protein: Negative Urine Glucose: Negative  Assessment and Plan:  Pregnancy: G2P0010 at 561w3d  1. Supervision of normal pregnancy, antepartum, second trimester  - GC[facility]Chlamydia [facility] amp [facility]  2. Headache in  pregnancy, antepartum, second trimester -discussed taking tylenol, drinking water, & lying down in dark/quiet room for headaches -- if don't resolve can take dose of fioricet. If don't go away, worsen, or have associated symptoms, go to MAU.  -Normotensive today - butalbital-acetaminophen-caffeine (FIORICET) 50-325-40 MG tablet; Take 2 tablets by mouth every 6 (six) hours as needed for headache.  Dispense: 20 tablet; Refill: 0  Preterm labor symptoms and general obstetric precautions including but not limited to vaginal bleeding, contractions, leaking of fluid and fetal movement were reviewed in detail with the patient. Please refer to After Visit Summary for other counseling recommendations.  Return in about 4 weeks (around 07/31/2015) for Routine OB.   Judeth HornErin Jermari Tamargo, NP

## 2015-07-03 NOTE — Patient Instructions (Addendum)
General Headache Without Cause A headache is pain or discomfort felt around the head or neck area. There are many causes and types of headaches. In some cases, the cause may not be found.  HOME CARE  Managing Pain  Take over-the-counter and prescription medicines only as told by your doctor.  Lie down in a dark, quiet room when you have a headache.  If directed, apply ice to the head and neck area:  Put ice in a plastic bag.  Place a towel between your skin and the bag.  Leave the ice on for 20 minutes, 2-3 times per day.  Use a heating pad or hot shower to apply heat to the head and neck area as told by your doctor.  Keep lights dim if bright lights bother you or make your headaches worse. Eating and Drinking  Eat meals on a regular schedule.  Lessen how much alcohol you drink.  Lessen how much caffeine you drink, or stop drinking caffeine. General Instructions  Keep all follow-up visits as told by your doctor. This is important.  Keep a journal to find out if certain things bring on headaches. For example, write down:  What you eat and drink.  How much sleep you get.  Any change to your diet or medicines.  Relax by getting a massage or doing other relaxing activities.  Lessen stress.  Sit up straight. Do not tighten (tense) your muscles.  Do not use tobacco products. This includes cigarettes, chewing tobacco, or e-cigarettes. If you need help quitting, ask your doctor.  Exercise regularly as told by your doctor.  Get enough sleep. This often means 7-9 hours of sleep. GET HELP IF:  Your symptoms are not helped by medicine.  You have a headache that feels different than the other headaches.  You feel sick to your stomach (nauseous) or you throw up (vomit).  You have a fever. GET HELP RIGHT AWAY IF:   Your headache becomes really bad.  You keep throwing up.  You have a stiff neck.  You have trouble seeing.  You have trouble speaking.  You have  pain in the eye or ear.  Your muscles are weak or you lose muscle control.  You lose your balance or have trouble walking.  You feel like you will pass out (faint) or you pass out.  You have confusion.   This information is not intended to replace advice given to you by your health care provider. Make sure you discuss any questions you have with your health care provider.   Document Released: 10/01/2007 Document Revised: 09/12/2014 Document Reviewed: 04/16/2014 Elsevier Interactive Patient Education 2016 [employer]    Second Trimester of Pregnancy The second trimester is from week 13 through week 28, months 4 through 6. The second trimester is often a time when you feel your best. Your body has also adjusted to being pregnant, and you begin to feel better physically. Usually, morning sickness has lessened or quit completely, you may have more energy, and you may have an increase in appetite. The second trimester is also a time when the fetus is growing rapidly. At the end of the sixth month, the fetus is about 9 inches long and weighs about 1 pounds. You will likely begin to feel the baby move (quickening) between 18 and 20 weeks of the pregnancy. BODY CHANGES Your body goes through many changes during pregnancy. The changes vary from woman to woman.   Your weight will continue to increase. You will  notice your lower abdomen bulging out.  You may begin to get stretch marks on your hips, abdomen, and breasts.  You may develop headaches that can be relieved by medicines approved by your health care provider.  You may urinate more often because the fetus is pressing on your bladder.  You may develop or continue to have heartburn as a result of your pregnancy.  You may develop constipation because certain hormones are causing the muscles that push waste through your intestines to slow down.  You may develop hemorrhoids or swollen, bulging veins (varicose veins).  You may have  back pain because of the weight gain and pregnancy hormones relaxing your joints between the bones in your pelvis and as a result of a shift in weight and the muscles that support your balance.  Your breasts will continue to grow and be tender.  Your gums may bleed and may be sensitive to brushing and flossing.  Dark spots or blotches (chloasma, mask of pregnancy) may develop on your face. This will likely fade after the baby is born.  A dark line from your belly button to the pubic area (linea nigra) may appear. This will likely fade after the baby is born.  You may have changes in your hair. These can include thickening of your hair, rapid growth, and changes in texture. Some women also have hair loss during or after pregnancy, or hair that feels dry or thin. Your hair will most likely return to normal after your baby is born. WHAT TO EXPECT AT YOUR PRENATAL VISITS During a routine prenatal visit:  You will be weighed to make sure you and the fetus are growing normally.  Your blood pressure will be taken.  Your abdomen will be measured to track your baby's growth.  The fetal heartbeat will be listened to.  Any test results from the previous visit will be discussed. Your health care provider may ask you:  How you are feeling.  If you are feeling the baby move.  If you have had any abnormal symptoms, such as leaking fluid, bleeding, severe headaches, or abdominal cramping.  If you are using any tobacco products, including cigarettes, chewing tobacco, and electronic cigarettes.  If you have any questions. Other tests that may be performed during your second trimester include:  Blood tests that check for:  Low iron levels (anemia).  Gestational diabetes (between 24 and 28 weeks).  Rh antibodies.  Urine tests to check for infections, diabetes, or protein in the urine.  An ultrasound to confirm the proper growth and development of the baby.  An amniocentesis to check for  possible genetic problems.  Fetal screens for spina bifida and Down syndrome.  HIV (human immunodeficiency virus) testing. Routine prenatal testing includes screening for HIV, unless you choose not to have this test. HOME CARE INSTRUCTIONS   Avoid all smoking, herbs, alcohol, and unprescribed drugs. These chemicals affect the formation and growth of the baby.  Do not use any tobacco products, including cigarettes, chewing tobacco, and electronic cigarettes. If you need help quitting, ask your health care provider. You may receive counseling support and other resources to help you quit.  Follow your health care provider's instructions regarding medicine use. There are medicines that are either safe or unsafe to take during pregnancy.  Exercise only as directed by your health care provider. Experiencing uterine cramps is a good sign to stop exercising.  Continue to eat regular, healthy meals.  Wear a good support bra for breast tenderness.  Do not use hot tubs, steam rooms, or saunas.  Wear your seat belt at all times when driving.  Avoid raw meat, uncooked cheese, cat litter boxes, and soil used by cats. These carry germs that can cause birth defects in the baby.  Take your prenatal vitamins.  Take 1500-2000 mg of calcium daily starting at the 20th week of pregnancy until you deliver your baby.  Try taking a stool softener (if your health care provider approves) if you develop constipation. Eat more high-fiber foods, such as fresh vegetables or fruit and whole grains. Drink plenty of fluids to keep your urine clear or pale yellow.  Take warm sitz baths to soothe any pain or discomfort caused by hemorrhoids. Use hemorrhoid cream if your health care provider approves.  If you develop varicose veins, wear support hose. Elevate your feet for 15 minutes, 3-4 times a day. Limit salt in your diet.  Avoid heavy lifting, wear low heel shoes, and practice good posture.  Rest with your legs  elevated if you have leg cramps or low back pain.  Visit your dentist if you have not gone yet during your pregnancy. Use a soft toothbrush to brush your teeth and be gentle when you floss.  A sexual relationship may be continued unless your health care provider directs you otherwise.  Continue to go to all your prenatal visits as directed by your health care provider. SEEK MEDICAL CARE IF:   You have dizziness.  You have mild pelvic cramps, pelvic pressure, or nagging pain in the abdominal area.  You have persistent nausea, vomiting, or diarrhea.  You have a bad smelling vaginal discharge.  You have pain with urination. SEEK IMMEDIATE MEDICAL CARE IF:   You have a fever.  You are leaking fluid from your vagina.  You have spotting or bleeding from your vagina.  You have severe abdominal cramping or pain.  You have rapid weight gain or loss.  You have shortness of breath with chest pain.  You notice sudden or extreme swelling of your face, hands, ankles, feet, or legs.  You have not felt your baby move in over an hour.  You have severe headaches that do not go away with medicine.  You have vision changes.   This information is not intended to replace advice given to you by your health care provider. Make sure you discuss any questions you have with your health care provider.   Document Released: 12/16/2000 Document Revised: 01/12/2014 Document Reviewed: 02/23/2012 Elsevier Interactive Patient Education [employer]

## 2015-07-04 LAB — GC/CHLAMYDIA PROBE AMP (~~LOC~~) NOT AT ARMC
Chlamydia: NEGATIVE
Neisseria Gonorrhea: NEGATIVE

## 2015-07-31 ENCOUNTER — Encounter: Payer: Medicaid Other | Admitting: Advanced Practice Midwife

## 2015-08-22 NOTE — Discharge Summary (Signed)
L&D OB Triage Note  SUBJECTIVE Bonnie Shepard is a 30 y.o. G2P0010 female at 5451w4d, EDD Estimated Date of Delivery: 10/20/15 who presented to triage with complaints of nausea and vomiting of pregnancy. Patient was evaluated earlier in the day and [facility] and did not get her antinausea medication filled.  She was given an injection of Phenergan 25 mg IM with control of nausea. She was discharged and instructed to get her outpatient medications filled at the pharmacy   OBJECTIVE Nursing Evaluation: BP 138/70 (BP Location: Left Arm)   Pulse 90   Temp 98.4 F (36.9 C) (Oral)   Resp 18   Ht 5\' 2"  (1.575 m)   Wt 207 lb (93.9 kg)   LMP 01/02/2015   BMI 37.86 kg/m  no significant findings for preterm labor    NST INTERPRETATION: Indications: Nausea and vomiting  Mode: Doppler Baseline Rate (A): 156 bpm  no contractions         Contraction Frequency (min): none  ASSESSMENT Impression:  1. Pregnancy:  G2P0010 at [redacted] weeks gestation 2.  Nausea and vomiting, controlled with Phenergan  PLAN 1. Patient is to get her antinausea medication filled with the pharmacy the morning 2. Return to regular OB for routine prenatal care   Herold HarmsMartin A Defrancesco, MD

## 2015-08-28 ENCOUNTER — Ambulatory Visit (INDEPENDENT_AMBULATORY_CARE_PROVIDER_SITE_OTHER): Payer: Medicaid Other | Admitting: Clinical

## 2015-08-28 ENCOUNTER — Ambulatory Visit (INDEPENDENT_AMBULATORY_CARE_PROVIDER_SITE_OTHER): Payer: Medicaid Other | Admitting: Advanced Practice Midwife

## 2015-08-28 ENCOUNTER — Other Ambulatory Visit (HOSPITAL_COMMUNITY)
Admission: RE | Admit: 2015-08-28 | Discharge: 2015-08-28 | Disposition: A | Discharge status: Home / Self Care | Payer: Medicaid Other | Source: Ambulatory Visit | Attending: Advanced Practice Midwife | Admitting: Advanced Practice Midwife

## 2015-08-28 ENCOUNTER — Encounter: Payer: Self-pay | Admitting: Advanced Practice Midwife

## 2015-08-28 VITALS — BP 128/74 | HR 90 | Wt 218.0 lb

## 2015-08-28 DIAGNOSIS — Z113 Encounter for screening for infections with a predominantly sexual mode of transmission: Secondary | ICD-10-CM | POA: Insufficient documentation

## 2015-08-28 DIAGNOSIS — Z3493 Encounter for supervision of normal pregnancy, unspecified, third trimester: Secondary | ICD-10-CM

## 2015-08-28 DIAGNOSIS — F4323 Adjustment disorder with mixed anxiety and depressed mood: Secondary | ICD-10-CM | POA: Diagnosis not present

## 2015-08-28 DIAGNOSIS — Z202 Contact with and (suspected) exposure to infections with a predominantly sexual mode of transmission: Secondary | ICD-10-CM | POA: Diagnosis not present

## 2015-08-28 DIAGNOSIS — R748 Abnormal levels of other serum enzymes: Secondary | ICD-10-CM

## 2015-08-28 LAB — POCT URINALYSIS DIP (DEVICE)
Bilirubin Urine: NEGATIVE
Glucose, UA: 250 mg/dL — AB
Ketones, ur: NEGATIVE mg/dL
Nitrite: NEGATIVE
Protein, ur: 30 mg/dL — AB
Specific Gravity, Urine: >=1.03 (ref 1.005–1.030)
Urobilinogen, UA: 0.2 mg/dL (ref 0.0–1.0)
pH: 6 (ref 5.0–8.0)

## 2015-08-28 LAB — COMPREHENSIVE METABOLIC PANEL
ALT: 9 U/L (ref 6–29)
AST: 14 U/L (ref 10–30)
Albumin: 3.2 g/dL — ABNORMAL LOW (ref 3.6–5.1)
Alkaline Phosphatase: 88 U/L (ref 33–115)
BUN: 8 mg/dL (ref 7–25)
CO2: 22 mmol/L (ref 20–31)
Calcium: 8.7 mg/dL (ref 8.6–10.2)
Chloride: 105 mmol/L (ref 98–110)
Creat: 0.57 mg/dL (ref 0.50–1.10)
Glucose, Bld: 138 mg/dL — ABNORMAL HIGH (ref 65–99)
Potassium: 3.5 mmol/L (ref 3.5–5.3)
Sodium: 137 mmol/L (ref 135–146)
Total Bilirubin: 0.2 mg/dL (ref 0.2–1.2)
Total Protein: 6 g/dL — ABNORMAL LOW (ref 6.1–8.1)

## 2015-08-28 LAB — CBC
HCT: 34.8 % — ABNORMAL LOW (ref 35.0–45.0)
Hemoglobin: 11.9 g/dL (ref 11.7–15.5)
MCH: 30.8 pg (ref 27.0–33.0)
MCHC: 34.2 g/dL (ref 32.0–36.0)
MCV: 90.2 fL (ref 80.0–100.0)
MPV: 10.1 fL (ref 7.5–12.5)
Platelets: 251 10*3/uL (ref 140–400)
RBC: 3.86 MIL/uL (ref 3.80–5.10)
RDW: 13 % (ref 11.0–15.0)
WBC: 10.3 10*3/uL (ref 3.8–10.8)

## 2015-08-28 LAB — HIV ANTIBODY (ROUTINE TESTING W REFLEX): HIV 1&2 Ab, 4th Generation: NONREACTIVE

## 2015-08-28 LAB — GLUCOSE, RANDOM: Glucose, Bld: 138 mg/dL — ABNORMAL HIGH (ref 65–99)

## 2015-08-28 NOTE — Progress Notes (Signed)
  ASSESSMENT: Pt currently experiencing Adjustment disorder with mixed anxious and depressed mood. Pt needs to f[facility] with [facility] and [facility]. Pt would benefit from psychoeducation and brief therapeutic intervention regarding coping with symptoms of anxiety and depression.  Stage of Change: contemplative  PLAN: 1. F/U with behavioral health clinician in one month 2. Psychiatric Medications: none 3. Behavioral recommendations:   -Continue to take daily naps, as needed -Continue to spend time with supportive friends daily, by phone or in person -Continue to take relaxing drives, as needed -Continue to remember self-care as important in relationship -Consider reading educational material regarding coping with symptoms of anxiety and depression  SUBJECTIVE Pt. referred by Wynelle BourgeoisMarie Williams, CNM, for symptoms of anxiety and depression Pt. reports the following symptoms/concerns: Pt states that her primary source of anxiety is lack of support from FOB; she copes by making sure she takes care of herself by taking daytime naps before work, calling and visiting friends, and taking relaxing drives when she is irritated.  Duration of problem: Over one month Severity: mild(depressive), severe(anxious)   OBJECTIVE: Orientation & Cognition: Oriented x3. Thought processes normal and appropriate to situation. Mood: appropriate Affect: appropriate Appearance: appropriate Risk of harm to self or others: no known risk of harm to self or others Substance use: tobacco Assessments administered: PHQ9: 11/ GAD7: 16  Diagnosis: Adjustment disorder with mixed anxious and depressed mood CPT Code: F43.23  -------------------------------------------- Other(s) present in the room: none  Time spent with patient in exam room: 30 minutes, 8:30-9:00am  [facility] 08/28/2015 07/03/2015  Decreased Interest 1 1  Down, Depressed, Hopeless 1 1  PHQ - 2 Score 2 2  Altered sleeping 2 2  Tired, decreased energy 3 2   Change in appetite 1 1  Feeling bad or failure about yourself  1 2  Trouble concentrating 1 1  Moving slowly or fidgety/restless 1 0  Suicidal thoughts 0 0  PHQ-9 Score 11 10   GAD 7 : Generalized Anxiety Score 08/28/2015 07/03/2015  Nervous, Anxious, on Edge 2 2  Control/stop worrying 3 3  Worry too much - different things 3 2  Trouble relaxing 2 1  Restless 1 1  Easily annoyed or irritable 3 3  Afraid - awful might happen 2 2  Total GAD 7 Score 16 14

## 2015-08-29 LAB — RPR

## 2015-08-29 LAB — GC/CHLAMYDIA PROBE AMP (~~LOC~~) NOT AT ARMC
Chlamydia: NEGATIVE
Neisseria Gonorrhea: NEGATIVE

## 2015-08-30 LAB — GLUCOSE TOLERANCE, 1 HOUR

## 2015-09-01 NOTE — Patient Instructions (Signed)

## 2015-09-01 NOTE — Progress Notes (Signed)
Subjective:  Bonnie Shepard is a 30 y.o. G2P0010 at 6731w0d being seen today for ongoing prenatal care.  She is currently monitored for the following issues for this high-risk pregnancy and has Supervision of normal pregnancy, antepartum; Prior perinatal loss in second trimester, antepartum; Glucose tolerance test abnormal; Pregnancy; Hypokalemia; and Elevated liver enzymes on her problem list.  Patient reports depression and anxiety, situational stress.  Contractions: Not present.  .   . Denies leaking of fluid.   The following portions of the patient's history were reviewed and updated as appropriate: allergies, current medications, past family history, past medical history, past social history, past surgical history and problem list. Problem list updated.  Objective:   Vitals:   08/28/15 0821  BP: 128/74  Pulse: 90  Weight: 218 lb (98.9 kg)    Fetal Status: Fetal Heart Rate (bpm): 135         General:  Alert, oriented and cooperative. Patient is in no acute distress.  Skin: Skin is warm and dry. No rash noted.   Cardiovascular: Normal heart rate noted  Respiratory: Normal respiratory effort, no problems with respiration noted  Abdomen: Soft, gravid, appropriate for gestational age. Pain/Pressure: Present     Pelvic:  Cervical exam deferred        Extremities: Normal range of motion.     Mental Status: Normal mood and affect. Normal behavior. Normal judgment and thought content.   Urinalysis:      Assessment and Plan:  Pregnancy: G2P0010 at 3731w0d  1. Elevated liver enzymes     - Comprehensive metabolic panel  2. STD exposure   - HIV antibody (with reflex) - RPR - GC[facility]Chlamydia probe amp ([place]  3. Supervision of normal pregnancy, third trimester     Referred to SW today for counseling and evaluation - Glucose tolerance, 1 hour - CBC - RPR  Preterm labor symptoms and general obstetric precautions including but not limited to vaginal bleeding, contractions,  leaking of fluid and fetal movement were reviewed in detail with the patient. Please refer to After Visit Summary for other counseling recommendations.  RTC 2 weeks  Aviva SignsMarie L Williams, CNM

## 2015-09-06 ENCOUNTER — Telehealth: Payer: Self-pay | Admitting: General Practice

## 2015-09-06 NOTE — Telephone Encounter (Signed)
Per Wynelle BourgeoisMarie Shepard, patient needs 3 hr gtt. Called patient and informed her of results & explained 3 hr gtt test. Patient states she can come 9/6 @ 830. Patient had no questions

## 2015-09-12 ENCOUNTER — Other Ambulatory Visit: Payer: Medicaid Other

## 2015-09-12 DIAGNOSIS — R7309 Other abnormal glucose: Secondary | ICD-10-CM

## 2015-09-13 LAB — GLUCOSE TOLERANCE, 3 HOURS
Glucose Tolerance, 1 hour: 205 mg/dL — ABNORMAL HIGH (ref <190)
Glucose Tolerance, 2 hour: 129 mg/dL (ref <165)
Glucose Tolerance, Fasting: 91 mg/dL (ref 65–104)
Glucose, GTT - 3 Hour: 72 mg/dL (ref <145)

## 2015-09-16 ENCOUNTER — Telehealth: Payer: Self-pay

## 2015-09-18 ENCOUNTER — Ambulatory Visit (INDEPENDENT_AMBULATORY_CARE_PROVIDER_SITE_OTHER): Payer: Medicaid Other | Admitting: Obstetrics and Gynecology

## 2015-09-18 DIAGNOSIS — Z23 Encounter for immunization: Secondary | ICD-10-CM | POA: Diagnosis not present

## 2015-09-18 DIAGNOSIS — Z3483 Encounter for supervision of other normal pregnancy, third trimester: Secondary | ICD-10-CM | POA: Diagnosis not present

## 2015-09-18 DIAGNOSIS — Z3480 Encounter for supervision of other normal pregnancy, unspecified trimester: Secondary | ICD-10-CM

## 2015-09-18 LAB — POCT URINALYSIS DIP (DEVICE)
Bilirubin Urine: NEGATIVE
Glucose, UA: 100 mg/dL — AB
Nitrite: NEGATIVE
Protein, ur: NEGATIVE mg/dL
Specific Gravity, Urine: 1.025 (ref 1.005–1.030)
Urobilinogen, UA: 0.2 mg/dL (ref 0.0–1.0)
pH: 6 (ref 5.0–8.0)

## 2015-09-18 LAB — OB RESULTS CONSOLE GBS: GBS: POSITIVE

## 2015-09-18 MED ORDER — TETANUS-DIPHTH-ACELL PERTUSSIS 5-2.5-18.5 LF-MCG/0.5 IM SUSP
0.5000 mL | Freq: Once | INTRAMUSCULAR | Status: AC
  Administered 2015-09-18: 0.5 mL via INTRAMUSCULAR

## 2015-09-18 NOTE — Progress Notes (Signed)
   PRENATAL VISIT NOTE  Subjective:  Bonnie Shepard is a 30 y.o. G2P0010 at 675w3d being seen today for ongoing prenatal care.  She is currently monitored for the following issues for this low-risk pregnancy and has Supervision of normal pregnancy, antepartum; Prior perinatal loss in second trimester @ 16 weeks, antepartum; Glucose tolerance test abnormal; Hypokalemia; and Elevated liver enzymes on her problem list. Patient had abnormal 1 hour, and 3 hour GTT did not meet criteria for Diabetes   Patient reports no complaints.  Contractions: Irritability. Vag. Bleeding: None.  Movement: Present. Denies leaking of fluid.   The following portions of the patient's history were reviewed and updated as appropriate: allergies, current medications, past family history, past medical history, past social history, past surgical history and problem list. Problem list updated.  Objective:   Vitals:   09/18/15 1020  BP: 124/71  Pulse: 73  Weight: 224 lb 4.8 oz (101.7 kg)    Fetal Status: Fetal Heart Rate (bpm): 140   Movement: Present     General:  Alert, oriented and cooperative. Patient is in no acute distress.  Skin: Skin is warm and dry. No rash noted.   Cardiovascular: Normal heart rate noted  Respiratory: Normal respiratory effort, no problems with respiration noted  Abdomen: Soft, gravid, appropriate for gestational age. Pain/Pressure: Present     Pelvic:  Cervical exam deferred Dilation: Closed Effacement (%): Thick Station: -3  Extremities: Normal range of motion.  Edema: Mild pitting, slight indentation  Mental Status: Normal mood and affect. Normal behavior. Normal judgment and thought content.   Urinalysis: Urine Protein: 1+ Urine Glucose: 1+  Assessment and Plan:  Pregnancy: G2P0010 at 10575w3d  1. Supervision of normal pregnancy, antepartum, unspecified trimester  - Culture, beta strep (group b only) - Tdap (BOOSTRIX) injection 0.5 mL; Inject 0.5 mLs into the muscle once.   Preterm  labor symptoms and general obstetric precautions including but not limited to vaginal bleeding, contractions, leaking of fluid and fetal movement were reviewed in detail with the patient. Please refer to After Visit Summary for other counseling recommendations.  No Follow-up on file.  Duane LopeJennifer I Jessah Danser, NP

## 2015-09-18 NOTE — Progress Notes (Signed)
Declines flu vaccine today GBS cultures today

## 2015-09-20 LAB — CULTURE, BETA STREP (GROUP B ONLY)

## 2015-10-02 ENCOUNTER — Other Ambulatory Visit (HOSPITAL_COMMUNITY)
Admission: RE | Admit: 2015-10-02 | Discharge: 2015-10-02 | Disposition: A | Discharge status: Home / Self Care | Payer: Medicaid Other | Source: Ambulatory Visit | Attending: Family Medicine | Admitting: Family Medicine

## 2015-10-02 ENCOUNTER — Ambulatory Visit (INDEPENDENT_AMBULATORY_CARE_PROVIDER_SITE_OTHER): Payer: Medicaid Other | Admitting: Family Medicine

## 2015-10-02 VITALS — BP 133/89 | HR 84 | Wt 233.0 lb

## 2015-10-02 DIAGNOSIS — Z113 Encounter for screening for infections with a predominantly sexual mode of transmission: Secondary | ICD-10-CM | POA: Diagnosis not present

## 2015-10-02 DIAGNOSIS — Z3493 Encounter for supervision of normal pregnancy, unspecified, third trimester: Secondary | ICD-10-CM

## 2015-10-02 DIAGNOSIS — Z3483 Encounter for supervision of other normal pregnancy, third trimester: Secondary | ICD-10-CM

## 2015-10-02 DIAGNOSIS — Z22330 Carrier of Group B streptococcus: Secondary | ICD-10-CM

## 2015-10-02 DIAGNOSIS — O99820 Streptococcus B carrier state complicating pregnancy: Secondary | ICD-10-CM

## 2015-10-02 LAB — OB RESULTS CONSOLE GC/CHLAMYDIA: Gonorrhea: NEGATIVE

## 2015-10-02 NOTE — Progress Notes (Signed)
   PRENATAL VISIT NOTE  Subjective:  Bonnie Shepard is a 30 y.o. G2P0010 at 5293w3d being seen today for ongoing prenatal care.  She is currently monitored for the following issues for this low-risk pregnancy and has Supervision of normal pregnancy, antepartum; Prior perinatal loss in second trimester, antepartum; Glucose tolerance test abnormal; GBS (group B Streptococcus carrier), +RV culture, currently pregnant; Hypokalemia; and Elevated liver enzymes on her problem list.  Patient reports no complaints.  Contractions: Irritability. Vag. Bleeding: None.  Movement: Present. Denies leaking of fluid.   The following portions of the patient's history were reviewed and updated as appropriate: allergies, current medications, past family history, past medical history, past social history, past surgical history and problem list. Problem list updated.  Objective:   Vitals:   10/02/15 0953  BP: 133/89  Pulse: 84  Weight: 233 lb (105.7 kg)    Fetal Status: Fetal Heart Rate (bpm): 144   Movement: Present     General:  Alert, oriented and cooperative. Patient is in no acute distress.  Skin: Skin is warm and dry. No rash noted.   Cardiovascular: Normal heart rate noted  Respiratory: Normal respiratory effort, no problems with respiration noted  Abdomen: Soft, gravid, appropriate for gestational age. Pain/Pressure: Present     Pelvic:  Cervical exam deferred        Extremities: Normal range of motion.  Edema: Mild pitting, slight indentation  Mental Status: Normal mood and affect. Normal behavior. Normal judgment and thought content.   Urinalysis:      Assessment and Plan:  Pregnancy: G2P0010 at 8393w3d  1. Supervision of normal pregnancy in third trimester FHT normal - GC/Chlamydia probe amp ([place]  2. GBS (group B Streptococcus carrier), +RV culture, currently pregnant Discussed intrapartum prophylaxis  Term labor symptoms and general obstetric precautions including but not  limited to vaginal bleeding, contractions, leaking of fluid and fetal movement were reviewed in detail with the patient. Please refer to After Visit Summary for other counseling recommendations.  No Follow-up on file.  Levie HeritageJacob J Lindzey Zent, DO

## 2015-10-03 LAB — GC/CHLAMYDIA PROBE AMP (~~LOC~~) NOT AT ARMC
Chlamydia: NEGATIVE
Neisseria Gonorrhea: NEGATIVE

## 2015-10-09 ENCOUNTER — Ambulatory Visit (INDEPENDENT_AMBULATORY_CARE_PROVIDER_SITE_OTHER): Payer: Medicaid Other | Admitting: Family

## 2015-10-09 VITALS — BP 138/81 | Wt 233.1 lb

## 2015-10-09 DIAGNOSIS — Z3403 Encounter for supervision of normal first pregnancy, third trimester: Secondary | ICD-10-CM

## 2015-10-09 DIAGNOSIS — Z3400 Encounter for supervision of normal first pregnancy, unspecified trimester: Secondary | ICD-10-CM

## 2015-10-09 DIAGNOSIS — O99820 Streptococcus B carrier state complicating pregnancy: Secondary | ICD-10-CM

## 2015-10-09 NOTE — Patient Instructions (Signed)

## 2015-10-10 NOTE — Progress Notes (Signed)
   PRENATAL VISIT NOTE  Subjective:  Bonnie Shepard is a 30 y.o. G2P0010 at 8367w4d being seen today for ongoing prenatal care.  She is currently monitored for the following issues for this high-risk pregnancy and has Supervision of normal pregnancy, antepartum; Prior perinatal loss in second trimester, antepartum; Glucose tolerance test abnormal; GBS (group B Streptococcus carrier), +RV culture, currently pregnant; Hypokalemia; and Elevated liver enzymes on her problem list.  Patient reports occasional contractions.  Contractions: Irritability. Vag. Bleeding: None.  Movement: Present. Denies leaking of fluid.   The following portions of the patient's history were reviewed and updated as appropriate: allergies, current medications, past family history, past medical history, past social history, past surgical history and problem list. Problem list updated.  Objective:   Vitals:   10/09/15 0818  BP: 138/81  Weight: 233 lb 1.6 oz (105.7 kg)    Fetal Status: Fetal Heart Rate (bpm): 156 Fundal Height: 38 cm Movement: Present  Presentation: Vertex  General:  Alert, oriented and cooperative. Patient is in no acute distress.  Skin: Skin is warm and dry. No rash noted.   Cardiovascular: Normal heart rate noted  Respiratory: Normal respiratory effort, no problems with respiration noted  Abdomen: Soft, gravid, appropriate for gestational age. Pain/Pressure: Present     Pelvic:  Cervical exam performed Dilation: 2.5 Effacement (%): 50 Station: -2  Extremities: Normal range of motion.  Edema: Mild pitting, slight indentation  Mental Status: Normal mood and affect. Normal behavior. Normal judgment and thought content.   Urinalysis:      Assessment and Plan:  Pregnancy: G2P0010 at 5167w4d  1. Supervision of normal pregnancy in third trimester - Reviewed signs of labor - Continue working if desired/able  3. GBS (group B Streptococcus carrier), +RV culture, currently pregnant - Antibiotics in  labor  Term labor symptoms and general obstetric precautions including but not limited to vaginal bleeding, contractions, leaking of fluid and fetal movement were reviewed in detail with the patient. Please refer to After Visit Summary for other counseling recommendations.  Return in 1 week (on 10/16/2015).  Eino FarberWalidah Kennith GainN Karim, CNM

## 2015-10-16 ENCOUNTER — Ambulatory Visit (INDEPENDENT_AMBULATORY_CARE_PROVIDER_SITE_OTHER): Payer: Medicaid Other | Admitting: Family

## 2015-10-16 VITALS — BP 144/89 | HR 86 | Wt 239.9 lb

## 2015-10-16 DIAGNOSIS — R7309 Other abnormal glucose: Secondary | ICD-10-CM

## 2015-10-16 DIAGNOSIS — R7302 Impaired glucose tolerance (oral): Secondary | ICD-10-CM

## 2015-10-16 DIAGNOSIS — O368130 Decreased fetal movements, third trimester, not applicable or unspecified: Secondary | ICD-10-CM | POA: Diagnosis not present

## 2015-10-16 DIAGNOSIS — O133 Gestational [pregnancy-induced] hypertension without significant proteinuria, third trimester: Secondary | ICD-10-CM

## 2015-10-16 DIAGNOSIS — O0990 Supervision of high risk pregnancy, unspecified, unspecified trimester: Secondary | ICD-10-CM

## 2015-10-16 DIAGNOSIS — O169 Unspecified maternal hypertension, unspecified trimester: Secondary | ICD-10-CM

## 2015-10-16 LAB — COMPREHENSIVE METABOLIC PANEL
ALT: 11 U/L (ref 6–29)
AST: 15 U/L (ref 10–30)
Albumin: 2.8 g/dL — ABNORMAL LOW (ref 3.6–5.1)
Alkaline Phosphatase: 127 U/L — ABNORMAL HIGH (ref 33–115)
BUN: 11 mg/dL (ref 7–25)
CO2: 21 mmol/L (ref 20–31)
Calcium: 8.6 mg/dL (ref 8.6–10.2)
Chloride: 105 mmol/L (ref 98–110)
Creat: 0.49 mg/dL — ABNORMAL LOW (ref 0.50–1.10)
Glucose, Bld: 130 mg/dL — ABNORMAL HIGH (ref 65–99)
Potassium: 3.6 mmol/L (ref 3.5–5.3)
Sodium: 137 mmol/L (ref 135–146)
Total Bilirubin: 0.2 mg/dL (ref 0.2–1.2)
Total Protein: 5.3 g/dL — ABNORMAL LOW (ref 6.1–8.1)

## 2015-10-16 LAB — POCT URINALYSIS DIP (DEVICE)
Bilirubin Urine: NEGATIVE
Glucose, UA: NEGATIVE mg/dL
Ketones, ur: NEGATIVE mg/dL
Leukocytes, UA: NEGATIVE
Nitrite: NEGATIVE
Protein, ur: 30 mg/dL — AB
Specific Gravity, Urine: >=1.03 (ref 1.005–1.030)
Urobilinogen, UA: 0.2 mg/dL (ref 0.0–1.0)
pH: 6.5 (ref 5.0–8.0)

## 2015-10-16 LAB — CBC
HCT: 36.1 % (ref 35.0–45.0)
Hemoglobin: 12.3 g/dL (ref 11.7–15.5)
MCH: 30.9 pg (ref 27.0–33.0)
MCHC: 34.1 g/dL (ref 32.0–36.0)
MCV: 90.7 fL (ref 80.0–100.0)
MPV: 9.8 fL (ref 7.5–12.5)
Platelets: 201 10*3/uL (ref 140–400)
RBC: 3.98 MIL/uL (ref 3.80–5.10)
RDW: 13.1 % (ref 11.0–15.0)
WBC: 9.7 10*3/uL (ref 3.8–10.8)

## 2015-10-16 NOTE — Progress Notes (Signed)
   PRENATAL VISIT NOTE  Subjective:  Bonnie Shepard is a 30 y.o. G2P0010 at [redacted]w[redacted]d being seen today for ongoing prenatal care.  She is currently monitored for the following issues for this high-risk pregnancy and has Supervision of high risk pregnancy, antepartum; Prior perinatal loss in second trimester, antepartum; Glucose tolerance test abnormal; GBS (group B Streptococcus carrier), +RV culture, currently pregnant; Hypokalemia; Elevated liver enzymes; and Elevated blood pressure affecting pregnancy, antepartum on her problem list.  Patient reports increased contractions and pedal swelling.  Denies  headache, vision changes, or epigastric pain.    Contractions: Irritability. Vag. Bleeding: None.  Movement: (!) Decreased. Denies leaking of fluid.   The following portions of the patient's history were reviewed and updated as appropriate: allergies, current medications, past family history, past medical history, past social history, past surgical history and problem list. Problem list updated.  Objective:   Vitals:   10/16/15 0834  BP: (!) 144/89  Pulse: 86  Weight: 239 lb 14.4 oz (108.8 kg)    Fetal Status: Fetal Heart Rate (bpm): 145 Fundal Height: 40 cm Movement: (!) Decreased  Presentation: Vertex  General:  Alert, oriented and cooperative. Patient is in no acute distress.  Skin: Skin is warm and dry. No rash noted.   Cardiovascular: Normal heart rate noted  Respiratory: Normal respiratory effort, no problems with respiration noted  Abdomen: Soft, gravid, appropriate for gestational age. Pain/Pressure: Present     Pelvic:  Cervical exam performed Dilation: 3 Effacement (%): 50 Station: -2  Extremities: Normal range of motion.  Edema: Moderate pitting, indentation subsides rapidly  Mental Status: Normal mood and affect. Normal behavior. Normal judgment and thought content.   Urinalysis:      Assessment and Plan:  Pregnancy: G2P0010 at [redacted]w[redacted]d  1. . Elevated blood pressure affecting  pregnancy, antepartum - CBC - Comp Met (CMET) - Protein / creatinine ratio, urine  2. Elevated blood pressure - NST today > reactive - CBC/CMP/PR:CR ratio - Consulted with attending (Dr. Anyanwu) > stat labs > bring back tomorrow for BP check  Term labor symptoms and general obstetric precautions including but not limited to vaginal bleeding, contractions, leaking of fluid and fetal movement were reviewed in detail with the patient. Please refer to After Visit Summary for other counseling recommendations.  Return in about 1 day (around 10/17/2015), or Tomorrow  BP check; one week for NST and appt.   N Karim, CNM 

## 2015-10-17 ENCOUNTER — Encounter (HOSPITAL_COMMUNITY): Payer: Self-pay

## 2015-10-17 ENCOUNTER — Encounter: Payer: Self-pay | Admitting: *Deleted

## 2015-10-17 ENCOUNTER — Inpatient Hospital Stay (HOSPITAL_COMMUNITY)
Admission: AD | Admit: 2015-10-17 | Discharge: 2015-10-21 | DRG: 775 | Disposition: A | Discharge status: Home / Self Care | Payer: Medicaid Other | Source: Ambulatory Visit | Attending: Obstetrics & Gynecology | Admitting: Obstetrics & Gynecology

## 2015-10-17 ENCOUNTER — Ambulatory Visit: Payer: Medicaid Other | Admitting: *Deleted

## 2015-10-17 VITALS — BP 110/86 | HR 79

## 2015-10-17 DIAGNOSIS — Z3A39 39 weeks gestation of pregnancy: Secondary | ICD-10-CM

## 2015-10-17 DIAGNOSIS — O99824 Streptococcus B carrier state complicating childbirth: Secondary | ICD-10-CM | POA: Diagnosis present

## 2015-10-17 DIAGNOSIS — O99214 Obesity complicating childbirth: Secondary | ICD-10-CM | POA: Diagnosis present

## 2015-10-17 DIAGNOSIS — Z6841 Body Mass Index (BMI) 40.0 and over, adult: Secondary | ICD-10-CM | POA: Diagnosis not present

## 2015-10-17 DIAGNOSIS — F17210 Nicotine dependence, cigarettes, uncomplicated: Secondary | ICD-10-CM | POA: Diagnosis present

## 2015-10-17 DIAGNOSIS — O9952 Diseases of the respiratory system complicating childbirth: Secondary | ICD-10-CM | POA: Diagnosis present

## 2015-10-17 DIAGNOSIS — Z0130 Encounter for examination of blood pressure without abnormal findings: Secondary | ICD-10-CM

## 2015-10-17 DIAGNOSIS — O0990 Supervision of high risk pregnancy, unspecified, unspecified trimester: Secondary | ICD-10-CM

## 2015-10-17 DIAGNOSIS — O99334 Smoking (tobacco) complicating childbirth: Secondary | ICD-10-CM | POA: Diagnosis present

## 2015-10-17 DIAGNOSIS — O1494 Unspecified pre-eclampsia, complicating childbirth: Secondary | ICD-10-CM | POA: Diagnosis present

## 2015-10-17 DIAGNOSIS — O1490 Unspecified pre-eclampsia, unspecified trimester: Secondary | ICD-10-CM | POA: Diagnosis present

## 2015-10-17 DIAGNOSIS — O1404 Mild to moderate pre-eclampsia, complicating childbirth: Secondary | ICD-10-CM | POA: Diagnosis present

## 2015-10-17 DIAGNOSIS — R748 Abnormal levels of other serum enzymes: Secondary | ICD-10-CM

## 2015-10-17 DIAGNOSIS — Z8249 Family history of ischemic heart disease and other diseases of the circulatory system: Secondary | ICD-10-CM | POA: Diagnosis not present

## 2015-10-17 DIAGNOSIS — K219 Gastro-esophageal reflux disease without esophagitis: Secondary | ICD-10-CM | POA: Diagnosis present

## 2015-10-17 DIAGNOSIS — O9962 Diseases of the digestive system complicating childbirth: Secondary | ICD-10-CM | POA: Diagnosis present

## 2015-10-17 DIAGNOSIS — O09292 Supervision of pregnancy with other poor reproductive or obstetric history, second trimester: Secondary | ICD-10-CM

## 2015-10-17 DIAGNOSIS — R7309 Other abnormal glucose: Secondary | ICD-10-CM

## 2015-10-17 LAB — CBC
HCT: 36.1 % (ref 36.0–46.0)
Hemoglobin: 12.8 g/dL (ref 12.0–15.0)
MCH: 31.9 pg (ref 26.0–34.0)
MCHC: 35.5 g/dL (ref 30.0–36.0)
MCV: 90 fL (ref 78.0–100.0)
Platelets: 207 10*3/uL (ref 150–400)
RBC: 4.01 MIL/uL (ref 3.87–5.11)
RDW: 13.4 % (ref 11.5–15.5)
WBC: 10.4 10*3/uL (ref 4.0–10.5)

## 2015-10-17 LAB — COMPREHENSIVE METABOLIC PANEL
ALT: 16 U/L (ref 14–54)
AST: 21 U/L (ref 15–41)
Albumin: 2.9 g/dL — ABNORMAL LOW (ref 3.5–5.0)
Alkaline Phosphatase: 140 U/L — ABNORMAL HIGH (ref 38–126)
Anion gap: 7 (ref 5–15)
BUN: 13 mg/dL (ref 6–20)
CO2: 21 mmol/L — ABNORMAL LOW (ref 22–32)
Calcium: 8.7 mg/dL — ABNORMAL LOW (ref 8.9–10.3)
Chloride: 104 mmol/L (ref 101–111)
Creatinine, Ser: 0.48 mg/dL (ref 0.44–1.00)
GFR calc Af Amer: >60 mL/min (ref >60)
GFR calc non Af Amer: >60 mL/min (ref >60)
Glucose, Bld: 88 mg/dL (ref 65–99)
Potassium: 3.8 mmol/L (ref 3.5–5.1)
Sodium: 132 mmol/L — ABNORMAL LOW (ref 135–145)
Total Bilirubin: 0.5 mg/dL (ref 0.3–1.2)
Total Protein: 6.2 g/dL — ABNORMAL LOW (ref 6.5–8.1)

## 2015-10-17 LAB — TYPE AND SCREEN
ABO/RH(D): O POS
Antibody Screen: NEGATIVE

## 2015-10-17 LAB — PROTEIN / CREATININE RATIO, URINE
Creatinine, Urine: 122 mg/dL (ref 20–320)
Protein Creatinine Ratio: 418 mg/g creat — ABNORMAL HIGH (ref 21–161)
Total Protein, Urine: 51 mg/dL — ABNORMAL HIGH (ref 5–24)

## 2015-10-17 MED ORDER — OXYCODONE-ACETAMINOPHEN 5-325 MG PO TABS: 2.0000 | ORAL_TABLET | ORAL | Status: DC | PRN

## 2015-10-17 MED ORDER — OXYTOCIN 40 UNITS IN LACTATED RINGERS INFUSION - SIMPLE MED
1.0000 m[IU]/min | INTRAVENOUS | Status: DC
  Administered 2015-10-17 – 2015-10-18 (×2): 2 m[IU]/min via INTRAVENOUS

## 2015-10-17 MED ORDER — PENICILLIN G POTASSIUM 5000000 UNITS IJ SOLR
2.5000 10*6.[IU] | INTRAVENOUS | Status: DC
  Administered 2015-10-17 – 2015-10-18 (×9): 2.5 10*6.[IU] via INTRAVENOUS
  Filled 2015-10-17 (×16): qty 2.5

## 2015-10-17 MED ORDER — LACTATED RINGERS IV SOLN: 500.0000 mL | INTRAVENOUS | Status: DC | PRN

## 2015-10-17 MED ORDER — HYDRALAZINE HCL 20 MG/ML IJ SOLN: 10.0000 mg | Freq: Once | INTRAMUSCULAR | Status: DC | PRN

## 2015-10-17 MED ORDER — ACETAMINOPHEN 325 MG PO TABS
650.0000 mg | ORAL_TABLET | ORAL | Status: DC | PRN
  Administered 2015-10-18: 650 mg via ORAL
  Filled 2015-10-17: qty 2

## 2015-10-17 MED ORDER — OXYTOCIN 40 UNITS IN LACTATED RINGERS INFUSION - SIMPLE MED
2.5000 [IU]/h | INTRAVENOUS | Status: DC
  Administered 2015-10-19: 2.5 [IU]/h via INTRAVENOUS
  Filled 2015-10-17 (×2): qty 1000

## 2015-10-17 MED ORDER — OXYCODONE-ACETAMINOPHEN 5-325 MG PO TABS: 1.0000 | ORAL_TABLET | ORAL | Status: DC | PRN

## 2015-10-17 MED ORDER — LABETALOL HCL 5 MG/ML IV SOLN
20.0000 mg | INTRAVENOUS | Status: DC | PRN
  Administered 2015-10-17: 20 mg via INTRAVENOUS
  Filled 2015-10-17: qty 4

## 2015-10-17 MED ORDER — PENICILLIN G POTASSIUM 5000000 UNITS IJ SOLR
5.0000 10*6.[IU] | Freq: Once | INTRAVENOUS | Status: AC
  Administered 2015-10-17: 5 10*6.[IU] via INTRAVENOUS
  Filled 2015-10-17: qty 5

## 2015-10-17 MED ORDER — OXYTOCIN BOLUS FROM INFUSION
500.0000 mL | Freq: Once | INTRAVENOUS | Status: AC
  Administered 2015-10-19: 500 mL via INTRAVENOUS

## 2015-10-17 MED ORDER — LACTATED RINGERS IV SOLN
INTRAVENOUS | Status: DC
  Administered 2015-10-17 – 2015-10-18 (×2): via INTRAVENOUS

## 2015-10-17 MED ORDER — SOD CITRATE-CITRIC ACID 500-334 MG/5ML PO SOLN
30.0000 mL | ORAL | Status: DC | PRN
  Administered 2015-10-18: 30 mL via ORAL
  Filled 2015-10-17: qty 15

## 2015-10-17 MED ORDER — TERBUTALINE SULFATE 1 MG/ML IJ SOLN
0.2500 mg | Freq: Once | INTRAMUSCULAR | Status: DC | PRN
  Filled 2015-10-17: qty 1

## 2015-10-17 MED ORDER — ONDANSETRON HCL 4 MG/2ML IJ SOLN: 4.0000 mg | Freq: Four times a day (QID) | INTRAMUSCULAR | Status: DC | PRN

## 2015-10-17 MED ORDER — LIDOCAINE HCL (PF) 1 % IJ SOLN
30.0000 mL | INTRAMUSCULAR | Status: DC | PRN
  Filled 2015-10-17: qty 30

## 2015-10-17 MED ORDER — FLEET ENEMA 7-19 GM/118ML RE ENEM: 1.0000 | ENEMA | RECTAL | Status: DC | PRN

## 2015-10-17 MED ORDER — FENTANYL CITRATE (PF) 100 MCG/2ML IJ SOLN: 100.0000 ug | INTRAMUSCULAR | Status: DC | PRN

## 2015-10-17 NOTE — Anesthesia Pain Management Evaluation Note (Signed)
  CRNA Pain Management Visit Note  Patient: Bonnie Shepard, 30 y.o., female  "Hello I am a member of the anesthesia team at [facility]. We have an anesthesia team available at all times to provide care throughout the hospital, including epidural management and anesthesia for C-section. I don't know your plan for the delivery whether it a natural birth, water birth, IV sedation, nitrous supplementation, doula or epidural, but we want to meet your pain goals."   1.Was your pain managed to your expectations on prior hospitalizations?   No prior hospitalizations  2.What is your expectation for pain management during this hospitalization?     Epidural  3.How can we help you reach that goal? epidural  Record the patient's initial score and the patient's pain goal.   Pain: 2  Pain Goal: 6 [facility] wants you to be able to say your pain was always managed very well.  Kambrea Carrasco 10/17/2015

## 2015-10-17 NOTE — Progress Notes (Signed)
Patient presents for blood pressure check, 39w 4d. No headaches, blurred vision or abdominal pain reported. 1+ edema bilateral hands and feet. BP reported to Dr Shawnie PonsPratt. Patient to be admitted for induction per Dr Shawnie PonsPratt. Patient escorted to registration by [place], NT.

## 2015-10-17 NOTE — Progress Notes (Signed)
Pt had been up moving around room.  Will retake BP in 5 min.  Pt encouraged to maintain a calm and quiet environment.

## 2015-10-17 NOTE — H&P (Signed)
LABOR AND DELIVERY ADMISSION HISTORY AND PHYSICAL NOTE  Bonnie Shepard is a 30 y.o. female G2P0010 with IUP at 8254w4d by first trimester US presenting for IOL for pre-eclampsia.  Has had normal pregnancy so far. Yesterday at clinic was noted to have high blood pressures. Labs were drawn, protein to creatinine ratio was elevated, patient brought back to clinic today for BP check and sent in for admission for induction. She has exercise-induced asthma and has not had to use her inhaler in two years. She has not had an asthma attack in 3 years.  Feels well overall. Has had headaches on and off for past two weeks but does not have any headaches right now. Has been swelling for past month. Has occasional spots in vision, none now. No RUQ pain.    She reports positive fetal movement. She denies leakage of fluid or vaginal bleeding.  Prenatal History/Complications:  Past Medical History: Past Medical History:  Diagnosis Date  . Acid reflux   . Asthma   . Infection    freq UTI  . Vaginal Pap smear, abnormal    f/u wnl    Past Surgical History: Past Surgical History:  Procedure Laterality Date  . DILATION AND EVACUATION N/A 09/07/2013   Procedure: DILATATION AND EVACUATION;  Surgeon: Freddrick MarchKendra H. Tenny Crawoss, MD;  Location: [facility];  Service: Gynecology;  Laterality: N/A;  . TONSILLECTOMY      Obstetrical History: OB History    Gravida Para Term Preterm AB Living   2       1     SAB TAB Ectopic Multiple Live Births                  Social History: Social History   Social History  . Marital status: Single    Spouse name: N/A  . Number of children: N/A  . Years of education: N/A   Social History Main Topics  . Smoking status: Current Some Day Smoker    Packs/day: 0.50    Years: 7.00    Types: Cigarettes  . Smokeless tobacco: Never Used  . Alcohol use No     Comment: occasionally  . Drug use: No  . Sexual activity: Yes   Other Topics Concern  . None   Social History Narrative  .  None    Family History: Family History  Problem Relation Age of Onset  . Hypertension Other   . Cancer Other   . Hypertension Mother   . Hypertension Father   . Hypertension Maternal Grandmother   . Heart disease Maternal Grandmother   . Hearing loss Neg Hx     Allergies: No Known Allergies  Prescriptions Prior to Admission  Medication Sig Dispense Refill Last Dose  . acetaminophen (TYLENOL) 500 MG tablet Take 500 mg by mouth every 6 (six) hours as needed for headache. Reported on 07/03/2015   Taking  . butalbital-acetaminophen-caffeine (FIORICET) 50-325-40 MG tablet Take 2 tablets by mouth every 6 (six) hours as needed for headache. (Patient not taking: Reported on 10/16/2015) 20 tablet 0 Not Taking  . ondansetron (ZOFRAN ODT) 4 MG disintegrating tablet Take 1 tablet (4 mg total) by mouth every 6 (six) hours as needed for nausea. (Patient not taking: Reported on 10/16/2015) 30 tablet 2 Not Taking  . Prenatal Vit-Fe Fumarate-FA (PRENATAL MULTIVITAMIN) TABS tablet Take 1 tablet by mouth daily at 12 noon.    Taking  . promethazine (PHENERGAN) 25 MG tablet Take 1 tablet (25 mg total) by mouth every 6 (  six) hours as needed for nausea or vomiting. (Patient not taking: Reported on 10/16/2015) 30 tablet 2 Not Taking  . ranitidine (ZANTAC) 150 MG tablet Take 150 mg by mouth 2 (two) times daily. Reported on 07/03/2015   Taking     Review of Systems   All systems reviewed and negative except as stated in HPI  Last menstrual period 01/02/2015, unknown if currently breastfeeding. General appearance: alert, cooperative and no distress Lungs: no respiratory distress Heart: regular rate and rhythm  Abdomen: soft, non-tender, gravid abdomen Extremities: No calf swelling or tenderness, +1 pitting edema bilaterally lower extremities Presentation: cephalic by nursing exam Fetal monitoring: baseline 130, moderate variability, accelerations present, no decelerations Uterine activity: irritable     Prenatal labs: ABO, Rh: --/--/O POS (06/06 1428) Antibody: NEG (06/06 1428) Rubella: immune RPR: NON REAC (08/23 0918)  HBsAg: NEGATIVE (04/04 0936)  HIV: NONREACTIVE (08/23 0931)  GBS:   positive Prenatal Transfer Tool  Maternal Diabetes: No Genetic Screening: Declined Maternal Ultrasounds/Referrals: Normal Fetal Ultrasounds or other Referrals:  Referred to Materal Fetal Medicine  Maternal Substance Abuse:  No Significant Maternal Medications:  None Significant Maternal Lab Results: Lab values include: Group B Strep positive  No results found for this or any previous visit (from the past 24 hour(s)).  Patient Active Problem List   Diagnosis Date Noted  . Pre-eclampsia 10/17/2015  . Elevated blood pressure affecting pregnancy, antepartum 10/16/2015  . Elevated liver enzymes 08/28/2015  . Hypokalemia 06/11/2015  . GBS (group B Streptococcus carrier), +RV culture, currently pregnant 06/07/2015  . Glucose tolerance test abnormal 04/10/2015  . Supervision of high risk pregnancy, antepartum 04/09/2015  . Prior perinatal loss in second trimester, antepartum 04/09/2015    Assessment: Bonnie Shepard is a 30 y.o. G2P0010 at [redacted]w[redacted]d here for IOL for pre-eclampsia  #Labor: IOL for pre-e, will repeat labs and augment labor #Pain: IV pain med, gas, considering epidural #FWB: Category 1 tracing #ID:  GBS postive- PCN ordered #MOF: both #MOC:nexplanon #Circ:  n/a  Tillman Sers, DO PGY-1 10/12/201710:14 AM  I have seen and examined the patient and agree with the above documentation and findings. Luna Kitchens CNM

## 2015-10-17 NOTE — Progress Notes (Signed)
Protein creatinine ratio 418 (clinic value); [facility] now for BP check.  Informed Liborio NixonJanice, RN to notify provider of results.

## 2015-10-18 ENCOUNTER — Encounter (HOSPITAL_COMMUNITY): Payer: Self-pay | Admitting: Anesthesiology

## 2015-10-18 ENCOUNTER — Inpatient Hospital Stay (HOSPITAL_COMMUNITY): Payer: Medicaid Other | Admitting: Anesthesiology

## 2015-10-18 LAB — CBC WITH DIFFERENTIAL/PLATELET
Basophils Absolute: 0 10*3/uL (ref 0.0–0.1)
Basophils Relative: 0 %
Eosinophils Absolute: 0.1 10*3/uL (ref 0.0–0.7)
Eosinophils Relative: 1 %
HCT: 35.1 % — ABNORMAL LOW (ref 36.0–46.0)
Hemoglobin: 12 g/dL (ref 12.0–15.0)
Lymphocytes Relative: 20 %
Lymphs Abs: 1.9 10*3/uL (ref 0.7–4.0)
MCH: 31.3 pg (ref 26.0–34.0)
MCHC: 34.2 g/dL (ref 30.0–36.0)
MCV: 91.6 fL (ref 78.0–100.0)
Monocytes Absolute: 0.4 10*3/uL (ref 0.1–1.0)
Monocytes Relative: 4 %
Neutro Abs: 7 10*3/uL (ref 1.7–7.7)
Neutrophils Relative %: 75 %
Platelets: 193 10*3/uL (ref 150–400)
RBC: 3.83 MIL/uL — ABNORMAL LOW (ref 3.87–5.11)
RDW: 13.7 % (ref 11.5–15.5)
WBC: 9.4 10*3/uL (ref 4.0–10.5)

## 2015-10-18 LAB — RPR: RPR Ser Ql: NONREACTIVE

## 2015-10-18 MED ORDER — LIDOCAINE HCL (PF) 1 % IJ SOLN
INTRAMUSCULAR | Status: DC | PRN
  Administered 2015-10-18: 6 mL via EPIDURAL
  Administered 2015-10-18: 8 mL via EPIDURAL

## 2015-10-18 MED ORDER — DIPHENHYDRAMINE HCL 50 MG/ML IJ SOLN: 12.5000 mg | INTRAMUSCULAR | Status: DC | PRN

## 2015-10-18 MED ORDER — PHENYLEPHRINE 40 MCG/ML (10ML) SYRINGE FOR IV PUSH (FOR BLOOD PRESSURE SUPPORT)
80.0000 ug | PREFILLED_SYRINGE | INTRAVENOUS | Status: DC | PRN
  Filled 2015-10-18: qty 5
  Filled 2015-10-18: qty 10

## 2015-10-18 MED ORDER — EPHEDRINE 5 MG/ML INJ
10.0000 mg | INTRAVENOUS | Status: DC | PRN
  Filled 2015-10-18: qty 4

## 2015-10-18 MED ORDER — MISOPROSTOL 25 MCG QUARTER TABLET
25.0000 ug | ORAL_TABLET | Freq: Once | ORAL | Status: AC
  Administered 2015-10-18: 25 ug via VAGINAL
  Filled 2015-10-18: qty 0.25
  Filled 2015-10-18: qty 1

## 2015-10-18 MED ORDER — PHENYLEPHRINE 40 MCG/ML (10ML) SYRINGE FOR IV PUSH (FOR BLOOD PRESSURE SUPPORT)
80.0000 ug | PREFILLED_SYRINGE | INTRAVENOUS | Status: DC | PRN
  Filled 2015-10-18: qty 5

## 2015-10-18 MED ORDER — LACTATED RINGERS IV SOLN
500.0000 mL | Freq: Once | INTRAVENOUS | Status: AC
  Administered 2015-10-18: 500 mL via INTRAVENOUS

## 2015-10-18 MED ORDER — FENTANYL 2.5 MCG/ML BUPIVACAINE 1/10 % EPIDURAL INFUSION (WH - ANES)
14.0000 mL/h | INTRAMUSCULAR | Status: DC | PRN
  Administered 2015-10-18 (×2): 14 mL/h via EPIDURAL
  Filled 2015-10-18 (×2): qty 125

## 2015-10-18 NOTE — Progress Notes (Signed)
LABOR PROGRESS NOTE  Bonnie Shepard is a 30 y.o. G2P0010 at 6827w5d  admitted for IOL for pre-e.  Subjective: Doing well. Feeling comfortable with the epidural  Objective: BP 133/77 (BP Location: Right Arm)   Pulse 87   Temp 97.7 F (36.5 C) (Oral)   Resp 18   Ht 5\' 3"  (1.6 m)   Wt 108.9 kg (240 lb)   LMP 01/02/2015   BMI 42.51 kg/m  or  Vitals:   10/18/15 1805 10/18/15 1831 10/18/15 1904 10/18/15 2015  BP: (!) 94/50 (!) 96/46 98/78 133/77  Pulse: 71 68  87  Resp:  16 16 18   Temp: 97.7 F (36.5 C)     TempSrc: Oral     Weight:      Height:        Dilation: (P) 10 Effacement (%): 100 Station: -1 Presentation: Vertex Exam by:: Herma CarsonLindsay LIma, rn  Labs: Lab Results  Component Value Date   WBC 9.4 10/18/2015   HGB 12.0 10/18/2015   HCT 35.1 (L) 10/18/2015   MCV 91.6 10/18/2015   PLT 193 10/18/2015    Patient Active Problem List   Diagnosis Date Noted  . Pre-eclampsia 10/17/2015  . Elevated blood pressure affecting pregnancy, antepartum 10/16/2015  . Elevated liver enzymes 08/28/2015  . Hypokalemia 06/11/2015  . GBS (group B Streptococcus carrier), +RV culture, currently pregnant 06/07/2015  . Glucose tolerance test abnormal 04/10/2015  . Supervision of high risk pregnancy, antepartum 04/09/2015  . Prior perinatal loss in second trimester, antepartum 04/09/2015    Assessment / Plan: 30 y.o. G2P0010 at 7127w5d here for IOL for pre-e.  Labor: Progressing well on Pit. Cervix is completely dilated. Laboring down. Not feeling any pressure right now. Fetal Wellbeing:  Category I Pain Control:  Epidural Anticipated MOD:  SVD  Hilton SinclairKaty D Graysen Depaula, MD 10/18/2015, 10:01 PM

## 2015-10-18 NOTE — Anesthesia Preprocedure Evaluation (Signed)
Anesthesia Evaluation  Patient identified by MRN, date of birth, ID band Patient awake    Reviewed: Allergy & Precautions, H&P , Patient's Chart, lab work & pertinent test results  Airway Mallampati: II  TM Distance: >3 FB Neck ROM: full    Dental no notable dental hx.    Pulmonary Current Smoker,    Pulmonary exam normal        Cardiovascular Normal cardiovascular exam     Neuro/Psych negative neurological ROS  negative psych ROS   GI/Hepatic negative GI ROS, Neg liver ROS,   Endo/Other  Morbid obesity  Renal/GU negative Renal ROS     Musculoskeletal   Abdominal (+) + obese,   Peds  Hematology negative hematology ROS (+)   Anesthesia Other Findings   Reproductive/Obstetrics (+) Pregnancy                             Anesthesia Physical Anesthesia Plan  ASA: III  Anesthesia Plan: Epidural   Post-op Pain Management:    Induction:   Airway Management Planned:   Additional Equipment:   Intra-op Plan:   Post-operative Plan:   Informed Consent: I have reviewed the patients History and Physical, chart, labs and discussed the procedure including the risks, benefits and alternatives for the proposed anesthesia with the patient or authorized representative who has indicated his/her understanding and acceptance.     Plan Discussed with:   Anesthesia Plan Comments:         Anesthesia Quick Evaluation

## 2015-10-18 NOTE — Anesthesia Procedure Notes (Signed)
Epidural Patient location during procedure: OB Start time: 10/18/2015 3:51 PM End time: 10/18/2015 3:55 PM  Staffing Anesthesiologist: Leilani AbleHATCHETT, Virga Haltiwanger Performed: anesthesiologist   Preanesthetic Checklist Completed: patient identified, surgical consent, pre-op evaluation, timeout performed, IV checked, risks and benefits discussed and monitors and equipment checked  Epidural Patient position: sitting Prep: site prepped and draped and DuraPrep Patient monitoring: continuous pulse ox and blood pressure Approach: midline Location: L3-L4 Injection technique: LOR air  Needle:  Needle type: Tuohy  Needle gauge: 17 G Needle length: 9 cm and 9 Needle insertion depth: 8 cm Catheter type: closed end flexible Catheter size: 19 Gauge Catheter at skin depth: 13 cm Test dose: negative and Other  Assessment Sensory level: T9 Events: blood not aspirated, injection not painful, no injection resistance, negative IV test and no paresthesia  Additional Notes Reason for block:procedure for pain

## 2015-10-18 NOTE — Progress Notes (Signed)
Pt is on 26 mu/min of pitocin and not feeling any contractions.  FHR 140's, Cat 1.   Vitals:   10/18/15 0001 10/18/15 0132  BP: 125/78 128/83  Pulse: 69 79  Resp:    Temp:       Will stop pitocin, insert cytotec 25mc PV, restart pitocin in 4 hours.

## 2015-10-18 NOTE — Progress Notes (Signed)
Bonnie Shepard is a 30 y.o. G2P0010 at 6629w5d admitted for induction of labor due to Pre-eclamptic toxemia of pregnancy..  Subjective: Feels cramping more  Objective: BP (!) 152/82   Pulse 85   Temp 97.9 F (36.6 C) (Oral)   Resp 16   Ht 5\' 3"  (1.6 m)   Wt 108.9 kg (240 lb)   LMP 01/02/2015   BMI 42.51 kg/m  No intake/output data recorded. No intake/output data recorded.  FHT:  FHR: 130s bpm, variability: moderate,  accelerations:  Present,  decelerations:  Absent UC:   regular, every 3-4 minutes w/ Pit @ 1610mu/min SVE:   Dilation: 4 Effacement (%): 80 Station: -2 Exam by:: Clelia CroftShaw, CNM; AROM for clear fluid  Labs: Lab Results  Component Value Date   WBC 10.4 10/17/2015   HGB 12.8 10/17/2015   HCT 36.1 10/17/2015   MCV 90.0 10/17/2015   PLT 207 10/17/2015    Assessment / Plan: IUP@term  Pre-e  Keep ctx reg w/ Pit; hopeful that ROM will increase labor Plans epidural as she gets more uncomfortable  Jkwon Treptow CNM 10/18/2015, 2:08 PM

## 2015-10-18 NOTE — Progress Notes (Signed)
Pt had just gotten off phone and had an argument/was upset; will let pt cool off for a bit, then recheck.

## 2015-10-18 NOTE — Progress Notes (Signed)
  Patient seen and evaluated. Is resting comfortably. Feels contractions but has not required pain medicine yet. Desires epidural later on. Denies headache, abdominal pain, vision changes. Is on pitocin. Will continue to augment  Dilation: 3 Effacement (%): 80 Station: -2 Presentation: Vertex Exam by:: Melburn PopperMichelle Williams, RN  Anticipate vaginal delivery  Dolores PattyAngela Kilie Rund, DO PGY-1, [facility] 10/18/2015 11:00 AM

## 2015-10-19 ENCOUNTER — Encounter (HOSPITAL_COMMUNITY): Payer: Self-pay | Admitting: *Deleted

## 2015-10-19 DIAGNOSIS — O99824 Streptococcus B carrier state complicating childbirth: Secondary | ICD-10-CM

## 2015-10-19 DIAGNOSIS — O1494 Unspecified pre-eclampsia, complicating childbirth: Secondary | ICD-10-CM

## 2015-10-19 DIAGNOSIS — Z3A39 39 weeks gestation of pregnancy: Secondary | ICD-10-CM

## 2015-10-19 MED ORDER — WITCH HAZEL-GLYCERIN EX PADS: 1.0000 "application " | MEDICATED_PAD | CUTANEOUS | Status: DC | PRN

## 2015-10-19 MED ORDER — DIBUCAINE 1 % RE OINT: 1.0000 "application " | TOPICAL_OINTMENT | RECTAL | Status: DC | PRN

## 2015-10-19 MED ORDER — IBUPROFEN 600 MG PO TABS
600.0000 mg | ORAL_TABLET | Freq: Four times a day (QID) | ORAL | Status: DC
  Administered 2015-10-19 – 2015-10-21 (×10): 600 mg via ORAL
  Filled 2015-10-19 (×10): qty 1

## 2015-10-19 MED ORDER — ONDANSETRON HCL 4 MG PO TABS
4.0000 mg | ORAL_TABLET | ORAL | Status: DC | PRN
Start: 2015-10-19 — End: 2015-10-21

## 2015-10-19 MED ORDER — SIMETHICONE 80 MG PO CHEW
80.0000 mg | CHEWABLE_TABLET | ORAL | Status: DC | PRN
  Administered 2015-10-19 – 2015-10-20 (×3): 80 mg via ORAL
  Filled 2015-10-19 (×3): qty 1

## 2015-10-19 MED ORDER — DIPHENHYDRAMINE HCL 25 MG PO CAPS: 25.0000 mg | ORAL_CAPSULE | Freq: Four times a day (QID) | ORAL | Status: DC | PRN

## 2015-10-19 MED ORDER — TETANUS-DIPHTH-ACELL PERTUSSIS 5-2.5-18.5 LF-MCG/0.5 IM SUSP: 0.5000 mL | Freq: Once | INTRAMUSCULAR | Status: DC

## 2015-10-19 MED ORDER — ONDANSETRON HCL 4 MG/2ML IJ SOLN: 4.0000 mg | INTRAMUSCULAR | Status: DC | PRN

## 2015-10-19 MED ORDER — PRENATAL MULTIVITAMIN CH
1.0000 | ORAL_TABLET | Freq: Every day | ORAL | Status: DC
  Administered 2015-10-19 – 2015-10-21 (×3): 1 via ORAL
  Filled 2015-10-19 (×3): qty 1

## 2015-10-19 MED ORDER — ZOLPIDEM TARTRATE 5 MG PO TABS: 5.0000 mg | ORAL_TABLET | Freq: Every evening | ORAL | Status: DC | PRN

## 2015-10-19 MED ORDER — ACETAMINOPHEN 325 MG PO TABS
650.0000 mg | ORAL_TABLET | ORAL | Status: DC | PRN
Start: 2015-10-19 — End: 2015-10-21

## 2015-10-19 MED ORDER — COCONUT OIL OIL: 1.0000 "application " | TOPICAL_OIL | Status: DC | PRN

## 2015-10-19 MED ORDER — SENNOSIDES-DOCUSATE SODIUM 8.6-50 MG PO TABS
2.0000 | ORAL_TABLET | ORAL | Status: DC
  Administered 2015-10-20 (×2): 2 via ORAL
  Filled 2015-10-19 (×2): qty 2

## 2015-10-19 MED ORDER — BENZOCAINE-MENTHOL 20-0.5 % EX AERO
1.0000 "application " | INHALATION_SPRAY | CUTANEOUS | Status: DC | PRN
  Administered 2015-10-19: 1 via TOPICAL
  Filled 2015-10-19: qty 56

## 2015-10-19 NOTE — Lactation Note (Signed)
This note was copied from a baby's chart. Lactation Consultation Note  Patient Name: Girl Nicholaus Bloomina Deacon ZOXWR'UToday's Date: 10/19/2015   Per RN note, mom has changed to formula feeding. Went into room, mom was asleep, grandmother reports mom does not want to BF. Told her to have mom call and ask for [facility] if she changes her mind. Follow up prn.      Maternal Data    Feeding Feeding Type: Formula Nipple Type: Slow - flow  LATCH Score/Interventions                      Lactation Tools Discussed/Used     Consult Status      Silas FloodSharon S Lilymae Swiech 10/19/2015, 4:24 PM

## 2015-10-19 NOTE — Plan of Care (Signed)
Problem: Nutritional: Goal: Mother's verbalization of comfort with breastfeeding process will improve Outcome: Completed/Met Date Met: 10/19/15 Pt asked RN for formula stating that she no longer wants to breastfeed.  Pt reports it is uncomfortable and she doesn't like it and doesn't want to do it.  RN offered to help Pt latch baby to see if I could ease discomfort, Pt declined.  RN offered to set Pt up with a pump so mom could pump and give breast milk with a bottle but Pt declined.  LEAD explained and risks of formula feeding given.  Pt verbalized understanding and continued to ask for formula.  Breast care for the non-breastfeeding mother discussed and Pt given formula sheet, amounts of formula per feeding discussed.  Explained to Pt if she changed her mind and wanted to begin breastfeeding or pumping to call for assistance.  Pt verbalized understandi

## 2015-10-19 NOTE — Anesthesia Postprocedure Evaluation (Signed)
Anesthesia Post Note  Patient: Bonnie Shepard  Procedure(s) Performed: * No procedures listed *  Patient location during evaluation: Mother Baby Anesthesia Type: Epidural Level of consciousness: awake and alert Pain management: satisfactory to patient Vital Signs Assessment: post-procedure vital signs reviewed and stable Respiratory status: respiratory function stable Cardiovascular status: stable Postop Assessment: no headache, no backache, epidural receding, patient able to bend at knees, no signs of nausea or vomiting and adequate PO intake Anesthetic complications: no     Last Vitals:  Vitals:   10/19/15 0430 10/19/15 0845  BP: 135/82 124/76  Pulse: 89 90  Resp: 18 17  Temp: 36.3 C 36.7 C    Last Pain:  Vitals:   10/19/15 0845  TempSrc: Axillary  PainSc: 0-No pain   Pain Goal: Patients Stated Pain Goal: 0 (10/18/15 1330)               Bonnie Shepard

## 2015-10-20 MED ORDER — TRIAMTERENE-HCTZ 37.5-25 MG PO TABS
1.0000 | ORAL_TABLET | Freq: Every day | ORAL | Status: DC
  Filled 2015-10-20 (×2): qty 1

## 2015-10-20 MED ORDER — AMLODIPINE BESYLATE 5 MG PO TABS
5.0000 mg | ORAL_TABLET | Freq: Every day | ORAL | Status: DC
  Administered 2015-10-20 – 2015-10-21 (×2): 5 mg via ORAL
  Filled 2015-10-20 (×3): qty 1

## 2015-10-20 NOTE — Progress Notes (Signed)
Dr Nancy MarusMayo (resident) notified followup bp was 155/92.  Patient confided in this nurse that personal circumstances that have occurred with the baby's father in the last 24 hours could be causing the anxiety she is feeling as well as her stomach upset and increasing blood pressures.  When asked if she would like to speak with clergy or social work, she refused at this time. No new orders.

## 2015-10-20 NOTE — Progress Notes (Signed)
UR chart review completed.  

## 2015-10-20 NOTE — Progress Notes (Signed)
pts blood pressure, after a few hours' sleep, was 130 76.  Had not given prescribed bp yet as patient had been sleeping.  Called Dr Nancy MarusMayo to discuss. Hold medication; will talk with patient in a.m.

## 2015-10-20 NOTE — Progress Notes (Signed)
Patient ID: Bonnie Shepard, female   DOB: 04/12/1985, 30 y.o.   MRN: [id number]  POSTPARTUM PROGRESS NOTE  Post Partum Day #1 Subjective:  Bonnie Shepard is a 30 y.o. G2P1011 7019w6d s/p NSVD.  No acute events overnight, concern for social issues with FOB. Had elevated BPs overnight, denied any signs/symptoms of preE (CP/SOB, visual changes, headaches, RUQ/epigastic pain).  Pt denies problems with ambulating, voiding or po intake.  She denies nausea or vomiting.  Pain is well controlled.  She has had flatus. She has not had bowel movement.  Lochia Minimal.   Objective: Blood pressure 130/76, pulse 75, temperature 98 F (36.7 C), temperature source Oral, resp. rate 19, height 5\' 3"  (1.6 m), weight 240 lb (108.9 kg), last menstrual period 01/02/2015, SpO2 100 %, unknown if currently breastfeeding.  Physical Exam:  General: alert, cooperative and no distress Lochia:normal flow Chest: CTAB Heart: RRR no m/r/g Abdomen: +BS, soft, nontender,  Uterine Fundus: firm, below umbilicus DVT Evaluation: No calf swelling or tenderness Extremities: +1 pitting edema   Recent Labs  10/17/15 1015 10/18/15 1434  HGB 12.8 12.0  HCT 36.1 35.1*    Assessment/Plan:  ASSESSMENT: Bonnie Shepard is a 30 y.o. G2P1011 2919w6d s/p NSVD, pre-eclampsia  Monitor BPs q3h, if normal, will d/c Maxzide, if elevated will start Maxzide this AM No signs /symptoms of severe features Breastfeeding Contraception: Nexplanon    LOS: 3 days   Jen MowElizabeth Mumaw, DO 10/20/2015, 9:08 AM

## 2015-10-21 MED ORDER — AMLODIPINE BESYLATE 5 MG PO TABS: 5.0000 mg | ORAL_TABLET | Freq: Every day | ORAL | 5 refills | Status: DC

## 2015-10-21 MED ORDER — IBUPROFEN 600 MG PO TABS: 600.0000 mg | ORAL_TABLET | Freq: Four times a day (QID) | ORAL | 0 refills | Status: DC

## 2015-10-21 NOTE — Progress Notes (Signed)
CSW met with MOB for a consult regarding the arrest of FOB (D[place] Lawrence 06/10/1993) during L&D. MOB was polite and inviting. MOB reported that MOB was initially sad and upset that FOB was not going to present for infant's delivery.  CSW validated and normalized MOB's thoughts and feelings.  MOB expressed since the delivery of infant, MOB is feeling good.  MOB reports a great relationship with FOB and FOB's family, although MOB and FOB are not currently in a relationship.  MOB communicated that MOB has everything she needs for infant and MOB is prepared to take infant home. MOB denied psychosocial stressors and MH hx.There are no barriers to dc. CSW thanked MOB for meeting with CSW and provided MOB with CSW contact information.  There are no barriers to dc.  Bonnie Shepard, MSW, LCSW Clinical Social Work [phone]  

## 2015-10-21 NOTE — Discharge Summary (Signed)
OB Discharge Summary  Patient Name: Bonnie Shepard DOB: 10/28/1985 MRN: [id number]  Date of admission: 10/17/2015 Delivering MD: Willadean CarolMAYO, KATY DODD   Date of discharge: 10/21/2015  Admitting diagnosis: INDUCTION Intrauterine pregnancy: 6565w6d     Secondary diagnosis:Active Problems:   Pre-eclampsia  Additional problems:none     Discharge diagnosis: Term Pregnancy Delivered and Preeclampsia (mild)                                                                     Post partum procedures:none  Augmentation: Pitocin  Complications: None  Hospital course:  Induction of Labor With Vaginal Delivery   30 y.o. yo G2P1011 at 8765w6d was admitted to the hospital 10/17/2015 for induction of labor.  Indication for induction: Preeclampsia.  Patient had an uncomplicated labor course as follows: Membrane Rupture Time/Date: 2:02 PM ,10/18/2015   Intrapartum Procedures: Episiotomy: None [1]                                         Lacerations:  1st degree [2];Perineal [11]  Patient had delivery of a Viable infant.  Information for the patient's newborn:  Nadara ModeWhite, Girl Mikala [[id number]][[id number]]  Delivery Method: Vaginal, Spontaneous Delivery (Filed from Delivery Summary)   10/19/2015  Details of delivery can be found in separate delivery note.  Patient had a routine postpartum course. Patient is discharged home 10/21/15.   Physical exam Vitals:   10/20/15 1614 10/20/15 1900 10/20/15 2150 10/20/15 2155  BP: 135/82 137/88 (!) 144/81 137/71  Pulse: 77 90 79 78  Resp:  18 18 18   Temp:  98.6 F (37 C) 98.2 F (36.8 C)   TempSrc:  Oral Oral   SpO2:  98%    Weight:      Height:       General: alert, cooperative and no distress Lochia: appropriate Uterine Fundus: firm Incision: N/A DVT Evaluation: No evidence of DVT seen on physical exam. Labs: Lab Results  Component Value Date   WBC 9.4 10/18/2015   HGB 12.0 10/18/2015   HCT 35.1 (L) 10/18/2015   MCV 91.6 10/18/2015   PLT 193 10/18/2015    CMP Latest Ref Rng & Units 10/17/2015  Glucose 65 - 99 mg/dL 88  BUN 6 - 20 mg/dL 13  Creatinine 7.820.44 - 9.561.00 mg/dL 2.130.48  Sodium 086135 - 578145 mmol/L 132(L)  Potassium 3.5 - 5.1 mmol/L 3.8  Chloride 101 - 111 mmol/L 104  CO2 22 - 32 mmol/L 21(L)  Calcium 8.9 - 10.3 mg/dL 4.6(N8.7(L)  Total Protein 6.5 - 8.1 g/dL 6.2(L)  Total Bilirubin 0.3 - 1.2 mg/dL 0.5  Alkaline Phos 38 - 126 U/L 140(H)  AST 15 - 41 U/L 21  ALT 14 - 54 U/L 16    Discharge instruction: per After Visit Summary and "Baby and Me Booklet".  After Visit Meds:    Medication List    STOP taking these medications   acetaminophen 500 MG tablet Commonly known as:  TYLENOL   ondansetron 4 MG disintegrating tablet Commonly known as:  ZOFRAN ODT     TAKE these medications   amLODipine 5 MG tablet Commonly known  as:  NORVASC Take 1 tablet (5 mg total) by mouth daily.   butalbital-acetaminophen-caffeine 50-325-40 MG tablet Commonly known as:  FIORICET Take 2 tablets by mouth every 6 (six) hours as needed for headache.   calcium carbonate 500 MG chewable tablet Commonly known as:  TUMS - dosed in mg elemental calcium Chew 1 tablet by mouth daily as needed for indigestion or heartburn.   ibuprofen 600 MG tablet Commonly known as:  ADVIL,MOTRIN Take 1 tablet (600 mg total) by mouth every 6 (six) hours.   prenatal multivitamin Tabs tablet Take 1 tablet by mouth daily at 12 noon.   promethazine 25 MG tablet Commonly known as:  PHENERGAN Take 1 tablet (25 mg total) by mouth every 6 (six) hours as needed for nausea or vomiting.       Diet: routine diet  Activity: Advance as tolerated. Pelvic rest for 6 weeks.   Outpatient follow up:1 week for BP check and 6 wks for PP check Follow up Appt:No future appointments. Follow up visit: No Follow-up on file.  Postpartum contraception: Nexplanon  Newborn Data: Live born female  Birth Weight: 7 lb 4.1 oz (3290 g) APGAR: 8, 9  Baby Feeding: Breast Disposition:home  with mother   10/21/2015 Wyvonnia Dusky, CNM

## 2015-10-23 ENCOUNTER — Other Ambulatory Visit: Payer: Self-pay | Admitting: Advanced Practice Midwife

## 2015-12-03 ENCOUNTER — Ambulatory Visit (INDEPENDENT_AMBULATORY_CARE_PROVIDER_SITE_OTHER): Payer: Medicaid Other | Admitting: Student

## 2015-12-03 ENCOUNTER — Encounter: Payer: Self-pay | Admitting: Student

## 2015-12-03 DIAGNOSIS — N393 Stress incontinence (female) (male): Secondary | ICD-10-CM

## 2015-12-03 DIAGNOSIS — N39490 Overflow incontinence: Secondary | ICD-10-CM

## 2015-12-03 MED ORDER — NORETHIN-ETH ESTRAD-FE BIPHAS 1 MG-10 MCG / 10 MCG PO TABS: 1.0000 | ORAL_TABLET | Freq: Every day | ORAL | 11 refills | Status: DC

## 2015-12-03 NOTE — Progress Notes (Signed)
Subjective:     Bonnie Shepard is a 30 y.o. female who presents for a postpartum visit. She is 6 weeks postpartum following a spontaneous vaginal delivery. I have fully reviewed the prenatal and intrapartum course. The delivery was at 4082w6d gestational weeks. Outcome: spontaneous vaginal delivery. Anesthesia: epidural. Postpartum course has been uneventufl. Baby's course has been uneventful. Baby is feeding by bottle - Similac Advance. Bleeding staining only. Bowel function is normal. Bladder function is abnormal. Patient is sexually active. Contraception method is OCP (estrogen/progesterone). Postpartum depression screening: negative.  The following portions of the patient's history were reviewed and updated as appropriate: She has a current medication list which includes the following prescription(s): butalbital-acetaminophen-caffeine, calcium carbonate, ibuprofen, norethindrone-ethinyl estradiol-fe biphas, prenatal multivitamin, and promethazine..  Review of Systems A comprehensive review of systems was negative.   Objective:    There were no vitals taken for this visit.  General:  alert and cooperative   Breasts:  not evaluated  Lungs: clear to auscultation bilaterally  Heart:  regular rate and rhythm, S1, S2 normal, no murmur, click, rub or gallop  Abdomen: soft, non-tender; bowel sounds normal; no masses,  no organomegaly   Vulva:  not evaluated  Vagina: not evaluated  Cervix:  not evaluated  Corpus: not examined  Adnexa:  not evaluated  Rectal Exam: Not performed.        Assessment:    Normal post partum exam. Pap smear not done at today's visit. She stopped taking her norvasc at two weeks post partum and has not resumed. Her blood pressure was normotensive today.   Plan:    1. Contraception: OCP (estrogen/progesterone) 2. Bladder incontinence: Encouraged abdominal exercises for pelvic floor strength 3. Follow up in: 1 year or as needed.

## 2015-12-03 NOTE — Patient Instructions (Signed)
Oral Contraception Information Oral contraceptive pills (OCPs) are medicines taken to prevent pregnancy. OCPs work by preventing the ovaries from releasing eggs. The hormones in OCPs also cause the cervical mucus to thicken, preventing the sperm from entering the uterus. The hormones also cause the uterine lining to become thin, not allowing a fertilized egg to attach to the inside of the uterus. OCPs are highly effective when taken exactly as prescribed. However, OCPs do not prevent sexually transmitted diseases (STDs). Safe sex practices, such as using condoms along with the pill, can help prevent STDs.  Before taking the pill, you may have a physical exam and Pap test. Your health care provider may order blood tests. The health care provider will make sure you are a good candidate for oral contraception. Discuss with your health care provider the possible side effects of the OCP you may be prescribed. When starting an OCP, it can take 2 to 3 months for the body to adjust to the changes in hormone levels in your body.  TYPES OF ORAL CONTRACEPTION  The combination pill-This pill contains estrogen and progestin (synthetic progesterone) hormones. The combination pill comes in 21-day, 28-day, or 91-day packs. Some types of combination pills are meant to be taken continuously (365-day pills). With 21-day packs, you do not take pills for 7 days after the last pill. With 28-day packs, the pill is taken every day. The last 7 pills are without hormones. Certain types of pills have more than 21 hormone-containing pills. With 91-day packs, the first 84 pills contain both hormones, and the last 7 pills contain no hormones or contain estrogen only.  The minipill-This pill contains the progesterone hormone only. The pill is taken every day continuously. It is very important to take the pill at the same time each day. The minipill comes in packs of 28 pills. All 28 pills contain the hormone.  ADVANTAGES OF ORAL  CONTRACEPTIVE PILLS  Decreases premenstrual symptoms.   Treats menstrual period cramps.   Regulates the menstrual cycle.   Decreases a heavy menstrual flow.   May treatacne, depending on the type of pill.   Treats abnormal uterine bleeding.   Treats polycystic ovarian syndrome.   Treats endometriosis.   Can be used as emergency contraception.  THINGS THAT CAN MAKE ORAL CONTRACEPTIVE PILLS LESS EFFECTIVE OCPs can be less effective if:   You forget to take the pill at the same time every day.   You have a stomach or intestinal disease that lessens the absorption of the pill.   You take OCPs with other medicines that make OCPs less effective, such as antibiotics, certain HIV medicines, and some seizure medicines.   You take expired OCPs.   You forget to restart the pill on day 7, when using the packs of 21 pills.  RISKS ASSOCIATED WITH ORAL CONTRACEPTIVE PILLS  Oral contraceptive pills can sometimes cause side effects, such as:  Headache.  Nausea.  Breast tenderness.  Irregular bleeding or spotting. Combination pills are also associated with a small increased risk of:  Blood clots.  Heart attack.  Stroke. This information is not intended to replace advice given to you by your health care provider. Make sure you discuss any questions you have with your health care provider. Document Released: 03/14/2002 Document Revised: 04/15/2015 Document Reviewed: 06/12/2012 Elsevier Interactive Patient Education  2017 Elsevier Inc.  

## 2015-12-10 NOTE — Telephone Encounter (Signed)
error 

## 2016-06-10 ENCOUNTER — Encounter: Payer: Self-pay | Admitting: Obstetrics and Gynecology

## 2016-06-10 ENCOUNTER — Ambulatory Visit (INDEPENDENT_AMBULATORY_CARE_PROVIDER_SITE_OTHER): Payer: Self-pay | Admitting: Obstetrics and Gynecology

## 2016-06-10 VITALS — BP 106/66 | HR 45 | Ht 63.0 in | Wt 226.4 lb

## 2016-06-10 DIAGNOSIS — N3946 Mixed incontinence: Secondary | ICD-10-CM

## 2016-06-10 MED ORDER — TOLTERODINE TARTRATE ER 2 MG PO CP24
2.0000 mg | ORAL_CAPSULE | Freq: Every day | ORAL | 3 refills | Status: DC
Start: 2016-06-10 — End: 2016-07-10

## 2016-06-10 NOTE — Progress Notes (Signed)
GYNECOLOGY CLINIC PROGRESS NOTE Subjective:     Bonnie Shepard is a 31 y.o. 722P1011 female who was referred by Gabriel Cirriheryl Wicker, NP [facility]) for evaluation of urinary incontinence. This has been present for approximately 2 years. She leaks urine with bending, coughing, lifting, sneezing, with urge. Patient describes the symptoms as sensation of incomplete emptying of bladder, the urge to urinate recurs again shortly following micturition, urge to urinate with little or no warning and urine leakage with coughing/heavy physical activity. Factors associated with symptoms include: worsening since childbirth 8 months ago. Evaluation to date includes UA/CS: normal (with PCP). Treatment to date includes Kegel exercises, which was unable to assess effectiveness due to poor compliance (patient notes she may perform 2-3 times daily if she remembers.      Past Medical History:  Diagnosis Date  . Acid reflux   . Asthma   . Infection    freq UTI  . Vaginal Pap smear, abnormal    f/u wnl    Family History  Problem Relation Age of Onset  . Hypertension Other   . Cancer Other   . Hypertension Mother   . Hypertension Father   . Hypertension Maternal Grandmother   . Heart disease Maternal Grandmother   . Hearing loss Neg Hx     Past Surgical History:  Procedure Laterality Date  . DILATION AND EVACUATION N/A 09/07/2013   Procedure: DILATATION AND EVACUATION;  Surgeon: Freddrick MarchKendra H. Tenny Crawoss, MD;  Location: [facility];  Service: Gynecology;  Laterality: N/A;  . TONSILLECTOMY      Social History   Social History  . Marital status: Single    Spouse name: N/A  . Number of children: N/A  . Years of education: N/A   Occupational History  . Not on file.   Social History Main Topics  . Smoking status: Current Some Day Smoker    Packs/day: 0.50    Years: 7.00    Types: Cigarettes  . Smokeless tobacco: Never Used  . Alcohol use No     Comment: occasionally  . Drug use: No  . Sexual  activity: Yes    Birth control/ protection: None   Other Topics Concern  . Not on file   Social History Narrative  . No narrative on file    No current outpatient prescriptions on file prior to visit.   No current facility-administered medications on file prior to visit.     No Known Allergies  Review of Systems Pertinent items noted in HPI and remainder of comprehensive ROS otherwise negative.    Objective:    BP 106/66 (BP Location: Left Arm, Patient Position: Sitting, Cuff Size: Large)   Pulse (!) 45   Ht 5\' 3"  (1.6 m)   Wt 226 lb 6.4 oz (102.7 kg)   LMP 05/12/2016   Breastfeeding? No   BMI 40.10 kg/m  General appearance: alert and no distress, morbid obesity Abdomen: soft, non-tender; bowel sounds normal; no masses,  no organomegaly Pelvic: external genitalia normal, rectovaginal septum normal.  Vagina without discharge.  Cervix normal appearing, no lesions and no motion tenderness.  Uterus mobile, nontender, normal shape and size.  Adnexae non-palpable, nontender bilaterally. Positive leakage of large amount of urine on cough and valsalva.  Extremities: extremities normal, atraumatic, no cyanosis or edema Skin: Skin color, texture, turgor normal. No rashes or lesions Neurologic: Grossly normal    Assessment:    Stress incontinence. Severity = moderate., Detrusor instability.  Severity = moderate.   Plan:  The causes of incontinence and plan for evaluation and treatment were discussed. Appropriate educational materials were distributed. Discussed Kegel exercised in detail. Discussed planned voiding. Risk of alternatives were discussed and all questions answered.   Also discussed referral to pelvic floor physical therapy. Will refer.  Discussed possible use of pessary vs OTC aids (such as Poise Impressa vaginal inserts).  Patient will try OTC aids first.  Prescribed Detrol LA 2 mg for urge incontinence. Discussed side effects.  Advised on weight loss as  obesity is a contributing factor to her condition.  Patient has recently begun currently 3 times weekly.   Follow up in 4-6 weeks.    Hildred Laser, MD [facility]

## 2016-06-10 NOTE — Patient Instructions (Addendum)
Urinary Incontinence Urinary incontinence is the involuntary loss of urine from your bladder. What are the causes? There are many causes of urinary incontinence. They include:  Medicines.  Infections.  Prostatic enlargement, leading to overflow of urine from your bladder.  Surgery.  Neurological diseases.  Emotional factors.  What are the signs or symptoms? Urinary Incontinence can be divided into four types: 1. Urge incontinence. Urge incontinence is the involuntary loss of urine before you have the opportunity to go to the bathroom. There is a sudden urge to void but not enough time to reach a bathroom. 2. Stress incontinence. Stress incontinence is the sudden loss of urine with any activity that forces urine to pass. It is commonly caused by anatomical changes to the pelvis and sphincter areas of your body. 3. Overflow incontinence. Overflow incontinence is the loss of urine from an obstructed opening to your bladder. This results in a backup of urine and a resultant buildup of pressure within the bladder. When the pressure within the bladder exceeds the closing pressure of the sphincter, the urine overflows, which causes incontinence, similar to water overflowing a dam. 4. Total incontinence. Total incontinence is the loss of urine as a result of the inability to store urine within your bladder.  How is this diagnosed? Evaluating the cause of incontinence may require:  A thorough and complete medical and obstetric history.  A complete physical exam.  Laboratory tests such as a urine culture and sensitivities.  When additional tests are indicated, they can include:  An ultrasound exam.  Kidney and bladder X-rays.  Cystoscopy. This is an exam of the bladder using a narrow scope.  Urodynamic testing to test the nerve function to the bladder and sphincter areas.  How is this treated? Treatment for urinary incontinence depends on the cause:  For urge incontinence caused  by a bacterial infection, antibiotics will be prescribed. If the urge incontinence is related to medicines you take, your health care provider may have you change the medicine.  For stress incontinence, surgery to re-establish anatomical support to the bladder or sphincter, or both, will often correct the condition.  For overflow incontinence caused by an enlarged prostate, an operation to open the channel through the enlarged prostate will allow the flow of urine out of the bladder. In women with fibroids, a hysterectomy may be recommended.  For total incontinence, surgery on your urinary sphincter may help. An artificial urinary sphincter (an inflatable cuff placed around the urethra) may be required. In women who have developed a hole-like passage between their bladder and vagina (vesicovaginal fistula), surgery to close the fistula often is required.  Follow these instructions at home:  Normal daily hygiene and the use of pads or adult diapers that are changed regularly will help prevent odors and skin damage.  Avoid caffeine. It can overstimulate your bladder.  Use the bathroom regularly. Try about every 2-3 hours to go to the bathroom, even if you do not feel the need to do so. Take time to empty your bladder completely. After urinating, wait a minute. Then try to urinate again.  For causes involving nerve dysfunction, keep a log of the medicines you take and a journal of the times you go to the bathroom. Contact a health care provider if:  You experience worsening of pain instead of improvement in pain after your procedure.  Your incontinence becomes worse instead of better. Get help right away if:  You experience fever or shaking chills.  You are unable to   pass your urine.  You have redness spreading into your groin or down into your thighs. This information is not intended to replace advice given to you by your health care provider. Make sure you discuss any questions you have  with your health care provider. Document Released: 01/30/2004 Document Revised: 08/02/2015 Document Reviewed: 05/31/2012 Elsevier Interactive Patient Education  2018 Elsevier Inc[employer] Kegel Exercises Kegel exercises help strengthen the muscles that support the rectum, vagina, small intestine, bladder, and uterus. Doing Kegel exercises can help:  Improve bladder and bowel control.  Improve sexual response.  Reduce problems and discomfort during pregnancy.  Kegel exercises involve squeezing your pelvic floor muscles, which are the same muscles you squeeze when you try to stop the flow of urine. The exercises can be done while sitting, standing, or lying down, but it is best to vary your position. Phase 1 exercises 1. Squeeze your pelvic floor muscles tight. You should feel a tight lift in your rectal area. If you are a female, you should also feel a tightness in your vaginal area. Keep your stomach, buttocks, and legs relaxed. 2. Hold the muscles tight for up to 10 seconds. 3. Relax your muscles. Repeat this exercise 50 times a day or as many times as told by your health care provider. Continue to do this exercise for at least 4-6 weeks or for as long as told by your health care provider. This information is not intended to replace advice given to you by your health care provider. Make sure you discuss any questions you have with your health care provider. Document Released: 12/09/2011 Document Revised: 08/17/2015 Document Reviewed: 11/11/2014 Elsevier Interactive Patient Education  2018 [employer]vier Inc.  

## 2016-07-10 ENCOUNTER — Inpatient Hospital Stay (HOSPITAL_COMMUNITY)
Admission: AD | Admit: 2016-07-10 | Discharge: 2016-07-10 | Disposition: A | Discharge status: Home / Self Care | Payer: Self-pay | Source: Ambulatory Visit | Attending: Obstetrics and Gynecology | Admitting: Obstetrics and Gynecology

## 2016-07-10 ENCOUNTER — Encounter (HOSPITAL_COMMUNITY): Payer: Self-pay | Admitting: *Deleted

## 2016-07-10 ENCOUNTER — Other Ambulatory Visit: Payer: Self-pay

## 2016-07-10 ENCOUNTER — Inpatient Hospital Stay (HOSPITAL_COMMUNITY): Payer: Self-pay

## 2016-07-10 DIAGNOSIS — O99611 Diseases of the digestive system complicating pregnancy, first trimester: Secondary | ICD-10-CM | POA: Insufficient documentation

## 2016-07-10 DIAGNOSIS — O09292 Supervision of pregnancy with other poor reproductive or obstetric history, second trimester: Secondary | ICD-10-CM

## 2016-07-10 DIAGNOSIS — O26891 Other specified pregnancy related conditions, first trimester: Secondary | ICD-10-CM | POA: Insufficient documentation

## 2016-07-10 DIAGNOSIS — O161 Unspecified maternal hypertension, first trimester: Secondary | ICD-10-CM | POA: Insufficient documentation

## 2016-07-10 DIAGNOSIS — R12 Heartburn: Secondary | ICD-10-CM

## 2016-07-10 DIAGNOSIS — F17210 Nicotine dependence, cigarettes, uncomplicated: Secondary | ICD-10-CM | POA: Insufficient documentation

## 2016-07-10 DIAGNOSIS — O219 Vomiting of pregnancy, unspecified: Secondary | ICD-10-CM | POA: Insufficient documentation

## 2016-07-10 DIAGNOSIS — K219 Gastro-esophageal reflux disease without esophagitis: Secondary | ICD-10-CM | POA: Insufficient documentation

## 2016-07-10 DIAGNOSIS — Z3A01 Less than 8 weeks gestation of pregnancy: Secondary | ICD-10-CM | POA: Insufficient documentation

## 2016-07-10 DIAGNOSIS — R32 Unspecified urinary incontinence: Secondary | ICD-10-CM | POA: Insufficient documentation

## 2016-07-10 DIAGNOSIS — O10911 Unspecified pre-existing hypertension complicating pregnancy, first trimester: Secondary | ICD-10-CM

## 2016-07-10 DIAGNOSIS — Z3491 Encounter for supervision of normal pregnancy, unspecified, first trimester: Secondary | ICD-10-CM

## 2016-07-10 DIAGNOSIS — O99331 Smoking (tobacco) complicating pregnancy, first trimester: Secondary | ICD-10-CM | POA: Insufficient documentation

## 2016-07-10 DIAGNOSIS — R109 Unspecified abdominal pain: Secondary | ICD-10-CM | POA: Insufficient documentation

## 2016-07-10 LAB — URINALYSIS, ROUTINE W REFLEX MICROSCOPIC
Bacteria, UA: NONE SEEN
Bilirubin Urine: NEGATIVE
Glucose, UA: NEGATIVE mg/dL
Ketones, ur: 5 mg/dL — AB
Leukocytes, UA: NEGATIVE
Nitrite: NEGATIVE
Protein, ur: >=300 mg/dL — AB
Specific Gravity, Urine: 1.027 (ref 1.005–1.030)
pH: 6 (ref 5.0–8.0)

## 2016-07-10 LAB — COMPREHENSIVE METABOLIC PANEL
ALT: 52 U/L (ref 14–54)
AST: 34 U/L (ref 15–41)
Albumin: 4.5 g/dL (ref 3.5–5.0)
Alkaline Phosphatase: 82 U/L (ref 38–126)
Anion gap: 11 (ref 5–15)
BUN: 12 mg/dL (ref 6–20)
CO2: 26 mmol/L (ref 22–32)
Calcium: 9.9 mg/dL (ref 8.9–10.3)
Chloride: 98 mmol/L — ABNORMAL LOW (ref 101–111)
Creatinine, Ser: 0.61 mg/dL (ref 0.44–1.00)
GFR calc Af Amer: >60 mL/min (ref >60)
GFR calc non Af Amer: >60 mL/min (ref >60)
Glucose, Bld: 120 mg/dL — ABNORMAL HIGH (ref 65–99)
Potassium: 3.3 mmol/L — ABNORMAL LOW (ref 3.5–5.1)
Sodium: 135 mmol/L (ref 135–145)
Total Bilirubin: 0.7 mg/dL (ref 0.3–1.2)
Total Protein: 8.6 g/dL — ABNORMAL HIGH (ref 6.5–8.1)

## 2016-07-10 LAB — CBC
HCT: 42.1 % (ref 36.0–46.0)
Hemoglobin: 14.8 g/dL (ref 12.0–15.0)
MCH: 31.4 pg (ref 26.0–34.0)
MCHC: 35.2 g/dL (ref 30.0–36.0)
MCV: 89.4 fL (ref 78.0–100.0)
Platelets: 273 10*3/uL (ref 150–400)
RBC: 4.71 MIL/uL (ref 3.87–5.11)
RDW: 13 % (ref 11.5–15.5)
WBC: 17.5 10*3/uL — ABNORMAL HIGH (ref 4.0–10.5)

## 2016-07-10 LAB — HCG, QUANTITATIVE, PREGNANCY: hCG, Beta Chain, Quant, S: 179810 m[IU]/mL — ABNORMAL HIGH (ref <5)

## 2016-07-10 LAB — WET PREP, GENITAL
Clue Cells Wet Prep HPF POC: NONE SEEN
Sperm: NONE SEEN
Trich, Wet Prep: NONE SEEN
Yeast Wet Prep HPF POC: NONE SEEN

## 2016-07-10 LAB — POCT PREGNANCY, URINE: Preg Test, Ur: POSITIVE — AB

## 2016-07-10 MED ORDER — METOCLOPRAMIDE HCL 10 MG PO TABS: 10.0000 mg | ORAL_TABLET | Freq: Three times a day (TID) | ORAL | 2 refills | Status: DC

## 2016-07-10 MED ORDER — PROMETHAZINE HCL 25 MG PO TABS: 12.5000 mg | ORAL_TABLET | Freq: Four times a day (QID) | ORAL | 2 refills | Status: DC | PRN

## 2016-07-10 MED ORDER — LACTATED RINGERS IV BOLUS (SEPSIS)
1000.0000 mL | Freq: Once | INTRAVENOUS | Status: AC
  Administered 2016-07-10: 1000 mL via INTRAVENOUS

## 2016-07-10 MED ORDER — PROMETHAZINE HCL 25 MG/ML IJ SOLN
25.0000 mg | Freq: Once | INTRAMUSCULAR | Status: AC
  Administered 2016-07-10: 25 mg via INTRAVENOUS
  Filled 2016-07-10: qty 1

## 2016-07-10 MED ORDER — PANTOPRAZOLE SODIUM 20 MG PO TBEC: 20.0000 mg | DELAYED_RELEASE_TABLET | Freq: Every day | ORAL | 5 refills | Status: DC

## 2016-07-10 NOTE — MAU Provider Note (Signed)
Chief Complaint: Emesis and Abdominal Pain   First Provider Initiated Contact with Patient 07/10/16 1421      SUBJECTIVE HPI: Bonnie Shepard is a 31 y.o. G3P1011 at [redacted]w[redacted]d by unsure LMP who presents to maternity admissions reporting positive pregnancy test 3 days ago, lower abdominal cramping, upper abdominal burning, and n/v with emesis x 8-9 in 24 hours.  Her cramping and n/v started acutely 2 days ago.  She has hx of hyperemesis with hospital admission in previous pregnancies and believes this is what is going on now.  She denies sick contacts.  She has not tried any treatments. Nothing makes her symptoms better or worse. In previous pregnancies, she has taken prescription medication for acid reflux, and Phenergan, Reglan, Zofran with some success for nausea.  There are no other associated symptoms today.   She denies vaginal bleeding, vaginal itching/burning, urinary symptoms, h/a, dizziness, or fever/chills.     HPI  Past Medical History:  Diagnosis Date  . Acid reflux   . Asthma   . Infection    freq UTI  . Vaginal Pap smear, abnormal    f/u wnl   Past Surgical History:  Procedure Laterality Date  . DILATION AND EVACUATION N/A 09/07/2013   Procedure: DILATATION AND EVACUATION;  Surgeon: Freddrick March. Tenny Craw, MD;  Location: [facility];  Service: Gynecology;  Laterality: N/A;  . TONSILLECTOMY     Social History   Social History  . Marital status: Single    Spouse name: N/A  . Number of children: N/A  . Years of education: N/A   Occupational History  . Not on file.   Social History Main Topics  . Smoking status: Current Some Day Smoker    Packs/day: 0.50    Years: 7.00    Types: Cigarettes  . Smokeless tobacco: Never Used  . Alcohol use No     Comment: occasionally  . Drug use: No  . Sexual activity: Yes    Birth control/ protection: None   Other Topics Concern  . Not on file   Social History Narrative  . No narrative on file   No current facility-administered medications on  file prior to encounter.    No current outpatient prescriptions on file prior to encounter.   No Known Allergies  ROS:  Review of Systems  Constitutional: Negative for chills, fatigue and fever.  Respiratory: Negative for shortness of breath.   Cardiovascular: Negative for chest pain.  Gastrointestinal: Positive for abdominal pain, nausea and vomiting. Negative for constipation and diarrhea.  Genitourinary: Positive for pelvic pain. Negative for difficulty urinating, dysuria, flank pain, vaginal bleeding, vaginal discharge and vaginal pain.  Neurological: Negative for dizziness and headaches.  Psychiatric/Behavioral: Negative.      I have reviewed patient's Past Medical Hx, Surgical Hx, Family Hx, Social Hx, medications and allergies.   Physical Exam   Patient Vitals for the past 24 hrs:  BP Temp Temp src Pulse Resp SpO2 Weight  07/10/16 1812 (!) 153/85 98.4 F (36.9 C) Oral (!) 50 17 100 % -  07/10/16 1324 (!) 148/87 98.4 F (36.9 C) Oral (!) 52 20 100 % 219 lb 4 oz (99.5 kg)   Constitutional: Well-developed, well-nourished female in no acute distress.  Cardiovascular: normal rate Respiratory: normal effort GI: Abd soft, non-tender. Pos BS x 4 MS: Extremities nontender, no edema, normal ROM Neurologic: Alert and oriented x 4.  GU: Neg CVAT.  PELVIC EXAM: Cervix pink, visually closed, without lesion, scant Kehl creamy discharge, vaginal walls and  external genitalia normal Bimanual exam: Cervix 0/long/high, firm, anterior, neg CMT, uterus nontender, nonenlarged, adnexa without tenderness, enlargement, or mass  LAB RESULTS Results for orders placed or performed during the hospital encounter of 07/10/16 (from the past 24 hour(s))  Urinalysis, Routine w reflex microscopic     Status: Abnormal   Collection Time: 07/10/16 12:00 PM  Result Value Ref Range   Color, Urine YELLOW YELLOW   APPearance HAZY (A) CLEAR   Specific Gravity, Urine 1.027 1.005 - 1.030   pH 6.0 5.0 - 8.0    Glucose, UA NEGATIVE NEGATIVE mg/dL   Hgb urine dipstick SMALL (A) NEGATIVE   Bilirubin Urine NEGATIVE NEGATIVE   Ketones, ur 5 (A) NEGATIVE mg/dL   Protein, ur >=161>=300 (A) NEGATIVE mg/dL   Nitrite NEGATIVE NEGATIVE   Leukocytes, UA NEGATIVE NEGATIVE   RBC / HPF 0-5 0 - 5 RBC/hpf   WBC, UA 6-30 0 - 5 WBC/hpf   Bacteria, UA NONE SEEN NONE SEEN   Squamous Epithelial / LPF 6-30 (A) NONE SEEN   Mucous PRESENT   Pregnancy, urine POC     Status: Abnormal   Collection Time: 07/10/16  1:15 PM  Result Value Ref Range   Preg Test, Ur POSITIVE (A) NEGATIVE  Wet prep, genital     Status: Abnormal   Collection Time: 07/10/16  1:55 PM  Result Value Ref Range   Yeast Wet Prep HPF POC NONE SEEN NONE SEEN   Trich, Wet Prep NONE SEEN NONE SEEN   Clue Cells Wet Prep HPF POC NONE SEEN NONE SEEN   WBC, Wet Prep HPF POC FEW (A) NONE SEEN   Sperm NONE SEEN   CBC     Status: Abnormal   Collection Time: 07/10/16  2:20 PM  Result Value Ref Range   WBC 17.5 (H) 4.0 - 10.5 K/uL   RBC 4.71 3.87 - 5.11 MIL/uL   Hemoglobin 14.8 12.0 - 15.0 g/dL   HCT 09.642.1 04.536.0 - 40.946.0 %   MCV 89.4 78.0 - 100.0 fL   MCH 31.4 26.0 - 34.0 pg   MCHC 35.2 30.0 - 36.0 g/dL   RDW 81.113.0 91.411.5 - 78.215.5 %   Platelets 273 150 - 400 K/uL  hCG, quantitative, pregnancy     Status: Abnormal   Collection Time: 07/10/16  2:20 PM  Result Value Ref Range   hCG, Beta Chain, Quant, S 179,810 (H) <5 mIU/mL  Comprehensive metabolic panel     Status: Abnormal   Collection Time: 07/10/16  2:20 PM  Result Value Ref Range   Sodium 135 135 - 145 mmol/L   Potassium 3.3 (L) 3.5 - 5.1 mmol/L   Chloride 98 (L) 101 - 111 mmol/L   CO2 26 22 - 32 mmol/L   Glucose, Bld 120 (H) 65 - 99 mg/dL   BUN 12 6 - 20 mg/dL   Creatinine, Ser 9.560.61 0.44 - 1.00 mg/dL   Calcium 9.9 8.9 - 21.310.3 mg/dL   Total Protein 8.6 (H) 6.5 - 8.1 g/dL   Albumin 4.5 3.5 - 5.0 g/dL   AST 34 15 - 41 U/L   ALT 52 14 - 54 U/L   Alkaline Phosphatase 82 38 - 126 U/L   Total Bilirubin  0.7 0.3 - 1.2 mg/dL   GFR calc non Af Amer >60 >60 mL/min   GFR calc Af Amer >60 >60 mL/min   Anion gap 11 5 - 15    --/--/O POS (10/12 1015)  IMAGING [place] Ob Comp Less 14  Wks  Result Date: 07/10/2016 CLINICAL DATA:  31 year old pregnant female presents with abdominal pain and vomiting for 1 day. Quantitative beta HCG 179,810. EDC by LMP: 02/12/2017, projecting to an expected gestational age of [redacted] weeks 0 days. EXAM: OBSTETRIC <14 WK [place] AND TRANSVAGINAL OB US TECHNIQUE: Both transabdominal and transvaginal ultrasound examinations were performed for complete evaluation of the gestation as well as the maternal uterus, adnexal regions, and pelvic cul-de-sac. Transvaginal technique was performed to assess early pregnancy. COMPARISON:  No prior scans from this gestation. FINDINGS: Intrauterine gestational sac: Single intrauterine gestational sac appears normal in size, shape and position. Yolk sac:  Visualized. Embryo:  Visualized. Embryonic Cardiac Activity: Regular rate and rhythm. Embryonic Heart Rate: 142  bpm CRL:  12.9  mm   7 w   3 d                  [place] EDC: 02/23/2017 Subchorionic hemorrhage:  No convincing perigestational bleed. Maternal uterus/adnexae: Anteverted uterus. No uterine fibroids. Left ovary measures 2.3 x 2.3 x 2.2 cm. Right ovary measures 3.3 x 2.2 x 3.2 cm and contains a corpus luteum. No abnormal ovarian or adnexal masses. No abnormal free fluid in the pelvis. IMPRESSION: 1. Single living intrauterine gestation at 7 weeks 3 days by crown-rump length, discordant with the expected gestational age of [redacted] weeks 0 days, possibly due to an error in menstrual dating. A follow-up obstetric scan could be performed in 4 weeks to assess appropriate fetal growth, as clinically warranted . 2. No acute early first-trimester gestational abnormality. Electronically Signed   By: Delbert Phenix M.D.   On: 07/10/2016 16:37   US Ob Transvaginal  Result Date: 07/10/2016 CLINICAL DATA:  31 year old pregnant  female presents with abdominal pain and vomiting for 1 day. Quantitative beta HCG 179,810. EDC by LMP: 02/12/2017, projecting to an expected gestational age of [redacted] weeks 0 days. EXAM: OBSTETRIC <14 WK [place] AND TRANSVAGINAL OB US TECHNIQUE: Both transabdominal and transvaginal ultrasound examinations were performed for complete evaluation of the gestation as well as the maternal uterus, adnexal regions, and pelvic cul-de-sac. Transvaginal technique was performed to assess early pregnancy. COMPARISON:  No prior scans from this gestation. FINDINGS: Intrauterine gestational sac: Single intrauterine gestational sac appears normal in size, shape and position. Yolk sac:  Visualized. Embryo:  Visualized. Embryonic Cardiac Activity: Regular rate and rhythm. Embryonic Heart Rate: 142  bpm CRL:  12.9  mm   7 w   3 d                  [place] EDC: 02/23/2017 Subchorionic hemorrhage:  No convincing perigestational bleed. Maternal uterus/adnexae: Anteverted uterus. No uterine fibroids. Left ovary measures 2.3 x 2.3 x 2.2 cm. Right ovary measures 3.3 x 2.2 x 3.2 cm and contains a corpus luteum. No abnormal ovarian or adnexal masses. No abnormal free fluid in the pelvis. IMPRESSION: 1. Single living intrauterine gestation at 7 weeks 3 days by crown-rump length, discordant with the expected gestational age of [redacted] weeks 0 days, possibly due to an error in menstrual dating. A follow-up obstetric scan could be performed in 4 weeks to assess appropriate fetal growth, as clinically warranted . 2. No acute early first-trimester gestational abnormality. Electronically Signed   By: Delbert Phenix M.D.   On: 07/10/2016 16:37    MAU Management/MDM: Ordered labs and [place] and reviewed results.  IUP noted on today's [place] at [redacted]w[redacted]d.  Will treat n/v and acid reflux.  IV fluids and Phenergan ordered  and medication given but IV infiltrated before full bag of fluid administered.  Pt with very mild dehydration so will not restart IV but will treat with PO  medications. Pt has hx of  Rx for Phenergan, Reglan, and Protonix sent to pharmacy.Pt to f/[facility] with [facility] for high risk pregnancy care.  Referral sent to Urology r/t urinary incontinence. Pt stable at time of discharge.  ASSESSMENT 1. Normal IUP (intrauterine pregnancy) on prenatal ultrasound, first trimester   2. Abdominal pain during pregnancy in first trimester   3. Nausea and vomiting during pregnancy prior to [redacted] weeks gestation   4. Heartburn during pregnancy in first trimester   5. Gastroesophageal reflux disease without esophagitis   6. 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   pantoprazole 20 MG tablet Commonly known as:  PROTONIX Take 1 tablet (20 mg total) by mouth daily.   promethazine 25 MG tablet Commonly known as:  PHENERGAN Take 0.5-1 tablets (12.5-25 mg total) by mouth every 6 (six) hours as needed.      Follow-up Information    [facility] Follow [facility].   Specialty:  Obstetrics and Gynecology Why:  The office will call you with appointment, return to MAU as needed for emergencies. Contact information: [address] [place] [address] [phone]       [facility] Follow up.   Why:  Office to call you with appointment Contact information: [address] [place] [address] [place] [address] [phone]          Sharen Counter Certified Nurse-Midwife 07/10/2016  9:21 PM

## 2016-07-10 NOTE — MAU Note (Signed)
Been throwing up since yesterday at 4.  Having pain in upper abd, coffee ground like substance noted in emesis.  +HPT on Tues.

## 2016-07-11 LAB — HIV ANTIBODY (ROUTINE TESTING W REFLEX): HIV Screen 4th Generation wRfx: NONREACTIVE

## 2016-07-13 LAB — GC/CHLAMYDIA PROBE AMP (~~LOC~~) NOT AT ARMC
Chlamydia: NEGATIVE
Neisseria Gonorrhea: NEGATIVE

## 2016-07-22 ENCOUNTER — Encounter: Payer: Self-pay | Admitting: Obstetrics and Gynecology

## 2016-08-17 ENCOUNTER — Encounter: Payer: Self-pay | Admitting: Obstetrics & Gynecology

## 2016-09-08 IMAGING — CR DG ABDOMEN 1V
1 series · 1 of 1 positions shown · non-contrast
Comparison: None.

CLINICAL DATA: 29-year-old female with vomiting for 2 weeks and
constipation for 4 days. Patient is pregnant.

EXAM:
ABDOMEN - 1 VIEW

[abdomen kub]
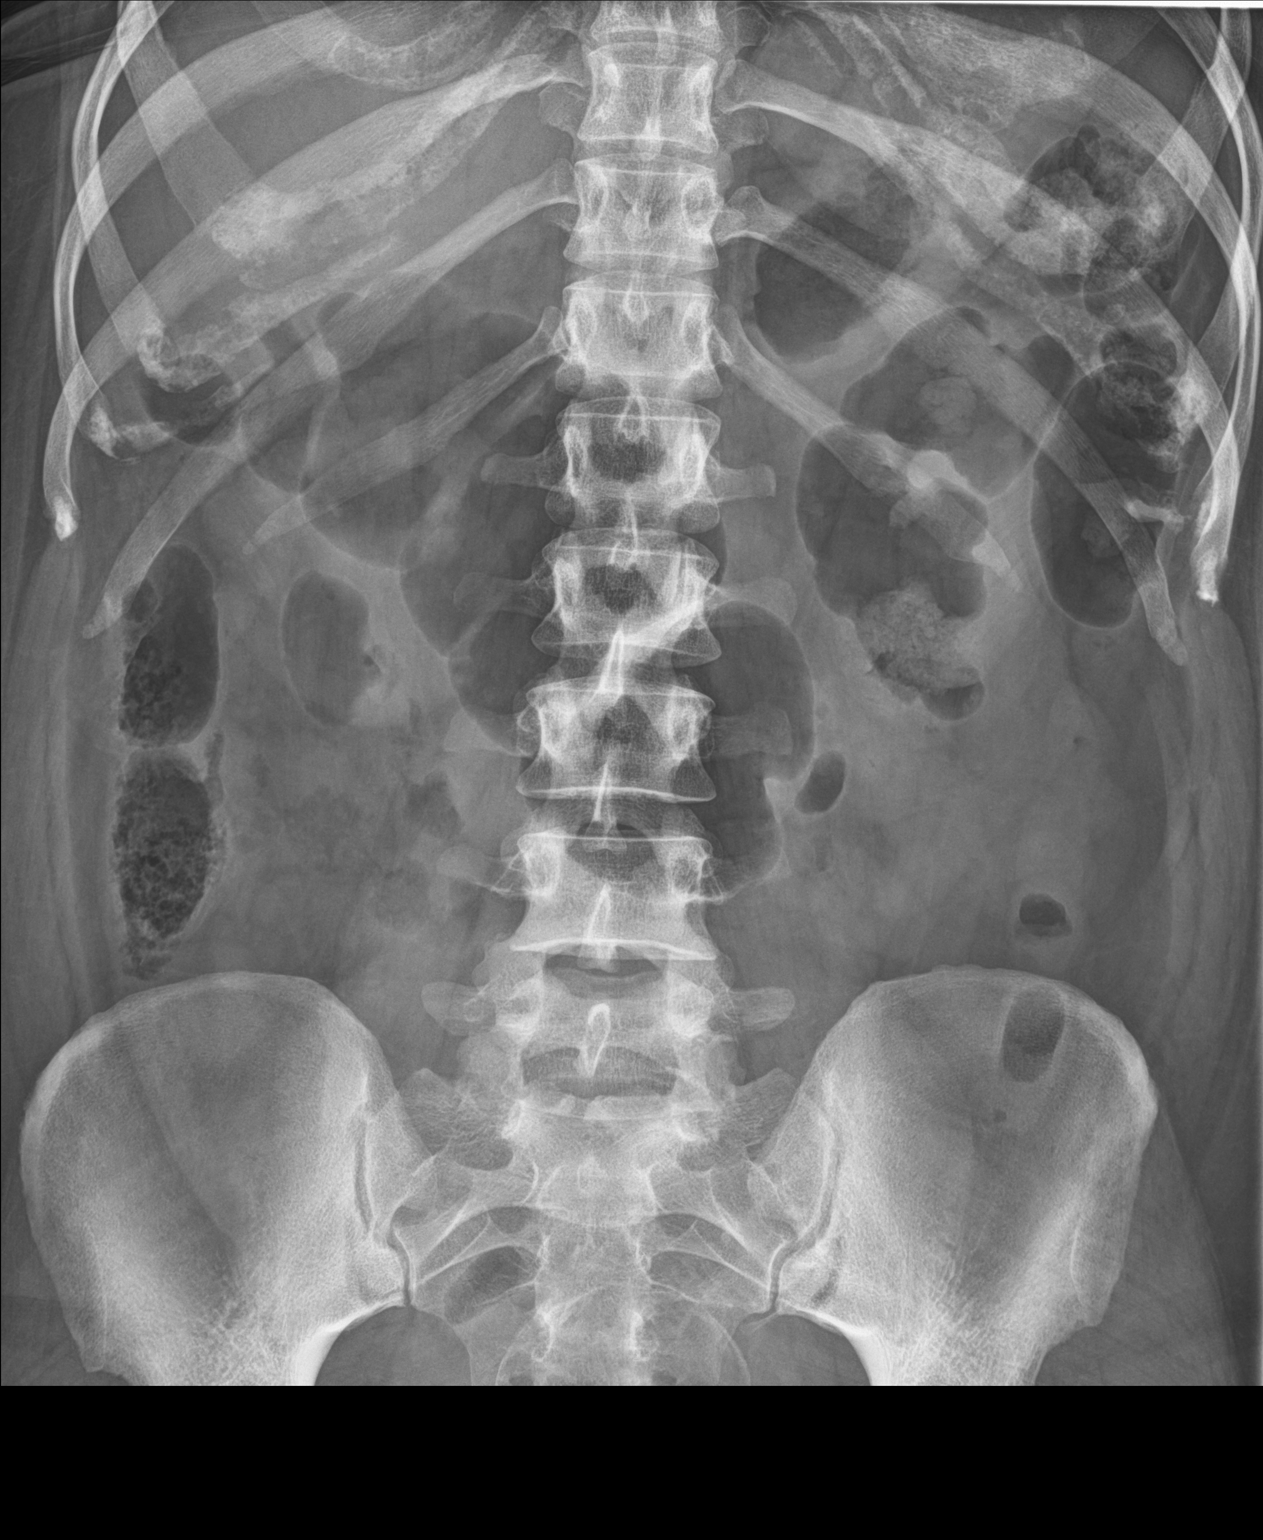

[1 of 1 positions shown; findings below may reference images not displayed]

FINDINGS: The bowel gas pattern is unremarkable with moderate gas within the
colon.

No dilated small bowel loops are present.

No suspicious calcifications are identified.

No acute bony abnormalities are present.

Curvilinear calcification within the pelvis presumably represents
gestation.
IMPRESSION: Unremarkable bowel gas pattern.

## 2017-02-01 ENCOUNTER — Encounter: Payer: Self-pay | Admitting: Emergency Medicine

## 2017-02-01 ENCOUNTER — Emergency Department
Admission: EM | Admit: 2017-02-01 | Discharge: 2017-02-01 | Disposition: A | Discharge status: Home / Self Care | Payer: Self-pay | Attending: Emergency Medicine | Admitting: Emergency Medicine

## 2017-02-01 ENCOUNTER — Other Ambulatory Visit: Payer: Self-pay

## 2017-02-01 DIAGNOSIS — J45909 Unspecified asthma, uncomplicated: Secondary | ICD-10-CM | POA: Insufficient documentation

## 2017-02-01 DIAGNOSIS — R112 Nausea with vomiting, unspecified: Secondary | ICD-10-CM | POA: Insufficient documentation

## 2017-02-01 DIAGNOSIS — F17210 Nicotine dependence, cigarettes, uncomplicated: Secondary | ICD-10-CM | POA: Insufficient documentation

## 2017-02-01 DIAGNOSIS — Z79899 Other long term (current) drug therapy: Secondary | ICD-10-CM | POA: Insufficient documentation

## 2017-02-01 DIAGNOSIS — E860 Dehydration: Secondary | ICD-10-CM | POA: Insufficient documentation

## 2017-02-01 DIAGNOSIS — R197 Diarrhea, unspecified: Secondary | ICD-10-CM | POA: Insufficient documentation

## 2017-02-01 LAB — URINALYSIS, COMPLETE (UACMP) WITH MICROSCOPIC
Bilirubin Urine: NEGATIVE
Glucose, UA: >=500 mg/dL — AB
Ketones, ur: 5 mg/dL — AB
Leukocytes, UA: NEGATIVE
Nitrite: NEGATIVE
Protein, ur: 100 mg/dL — AB
Specific Gravity, Urine: 1.027 (ref 1.005–1.030)
pH: 5 (ref 5.0–8.0)

## 2017-02-01 LAB — COMPREHENSIVE METABOLIC PANEL
ALT: 77 U/L — ABNORMAL HIGH (ref 14–54)
AST: 58 U/L — ABNORMAL HIGH (ref 15–41)
Albumin: 4.7 g/dL (ref 3.5–5.0)
Alkaline Phosphatase: 88 U/L (ref 38–126)
Anion gap: 12 (ref 5–15)
BUN: 16 mg/dL (ref 6–20)
CO2: 28 mmol/L (ref 22–32)
Calcium: 9.3 mg/dL (ref 8.9–10.3)
Chloride: 97 mmol/L — ABNORMAL LOW (ref 101–111)
Creatinine, Ser: 0.81 mg/dL (ref 0.44–1.00)
GFR calc Af Amer: >60 mL/min (ref >60)
GFR calc non Af Amer: >60 mL/min (ref >60)
Glucose, Bld: 237 mg/dL — ABNORMAL HIGH (ref 65–99)
Potassium: 3.3 mmol/L — ABNORMAL LOW (ref 3.5–5.1)
Sodium: 137 mmol/L (ref 135–145)
Total Bilirubin: 0.6 mg/dL (ref 0.3–1.2)
Total Protein: 8.7 g/dL — ABNORMAL HIGH (ref 6.5–8.1)

## 2017-02-01 LAB — CBC
HCT: 43.2 % (ref 35.0–47.0)
Hemoglobin: 14.7 g/dL (ref 12.0–16.0)
MCH: 30.5 pg (ref 26.0–34.0)
MCHC: 33.9 g/dL (ref 32.0–36.0)
MCV: 89.9 fL (ref 80.0–100.0)
Platelets: 262 10*3/uL (ref 150–440)
RBC: 4.8 MIL/uL (ref 3.80–5.20)
RDW: 12.9 % (ref 11.5–14.5)
WBC: 9.7 10*3/uL (ref 3.6–11.0)

## 2017-02-01 LAB — LIPASE, BLOOD: Lipase: 23 U/L (ref 11–51)

## 2017-02-01 LAB — POCT PREGNANCY, URINE: Preg Test, Ur: NEGATIVE

## 2017-02-01 MED ORDER — ONDANSETRON HCL 4 MG/2ML IJ SOLN
4.0000 mg | Freq: Once | INTRAMUSCULAR | Status: AC
  Administered 2017-02-01: 4 mg via INTRAVENOUS

## 2017-02-01 MED ORDER — SODIUM CHLORIDE 0.9 % IV BOLUS (SEPSIS)
1000.0000 mL | Freq: Once | INTRAVENOUS | Status: AC
  Administered 2017-02-01: 1000 mL via INTRAVENOUS

## 2017-02-01 MED ORDER — METOCLOPRAMIDE HCL 10 MG PO TABS: 10.0000 mg | ORAL_TABLET | Freq: Three times a day (TID) | ORAL | 0 refills | Status: DC | PRN

## 2017-02-01 MED ORDER — ONDANSETRON HCL 4 MG/2ML IJ SOLN
INTRAMUSCULAR | Status: AC
  Filled 2017-02-01: qty 2

## 2017-02-01 MED ORDER — ONDANSETRON 4 MG PO TBDP: 4.0000 mg | ORAL_TABLET | Freq: Once | ORAL | Status: DC | PRN

## 2017-02-01 MED ORDER — METOCLOPRAMIDE HCL 5 MG/ML IJ SOLN
INTRAMUSCULAR | Status: AC
  Filled 2017-02-01: qty 2

## 2017-02-01 MED ORDER — METOCLOPRAMIDE HCL 5 MG/ML IJ SOLN
10.0000 mg | Freq: Once | INTRAMUSCULAR | Status: AC
  Administered 2017-02-01: 10 mg via INTRAVENOUS

## 2017-02-01 NOTE — Discharge Instructions (Signed)
Please follow-up with your primary care physician.  Please return with any other concerns or worsening conditions.  Please ensure that you are able to keep down fluids to remain hydrated.

## 2017-02-01 NOTE — ED Notes (Signed)
Pt vomited approx bilious fluid

## 2017-02-01 NOTE — ED Provider Notes (Signed)
[facility] Emergency Department Provider Note   ____________________________________________   First MD Initiated Contact with Patient 02/01/17 0502     (approximate)  I have reviewed the triage vital signs and the nursing notes.   HISTORY  Chief Complaint Emesis and Diarrhea    HPI Bonnie Shepard is a 32 y.o. female who comes into the hospital today with some nausea vomiting and diarrhea.  The patient states that the symptoms started Saturday.  She denies any sick contacts at home but she does work here in the emergency department.  She reports that she went home Saturday morning and then started vomiting.  She reports that it is changed but currently is mostly clear.  She said there is some dark residue in it occasionally.  She is vomited every other hour.  The patient has had 4 episodes of brown watery diarrhea as well.  The patient states that she has some pain at the top of her abdomen that is burning.  She rates her pain a 6 out of 10 in intensity.  She states that she did have a cough last week but nothing like this.  She is here again for evaluation  Past Medical History:  Diagnosis Date  . Acid reflux   . Asthma   . Infection    freq UTI  . Vaginal Pap smear, abnormal    f/u wnl    Patient Active Problem List   Diagnosis Date Noted  . History of pregnancy loss in prior pregnancy, currently pregnant in second trimester 07/10/2016  . 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 DILATION AND EVACUATION N/A 09/07/2013   Procedure: DILATATION AND EVACUATION;  Surgeon: Freddrick March. Tenny Craw, MD;  Location: [facility];  Service: Gynecology;  Laterality: N/A;  . TONSILLECTOMY      Prior to Admission medications   Medication Sig Start Date End Date Taking? Authorizing Provider  metoCLOPramide (REGLAN) 10 MG tablet Take 1 tablet (10 mg total) by mouth 3 (three) times daily before meals. 07/10/16  Yes  Leftwich-Kirby, Wilmer Floor, CNM  pantoprazole (PROTONIX) 20 MG tablet Take 1 tablet (20 mg total) by mouth daily. 07/10/16  Yes Leftwich-Kirby, Wilmer Floor, CNM  promethazine (PHENERGAN) 25 MG tablet Take 0.5-1 tablets (12.5-25 mg total) by mouth every 6 (six) hours as needed. 07/10/16  Yes Leftwich-Kirby, Wilmer Floor, CNM  metoCLOPramide (REGLAN) 10 MG tablet Take 1 tablet (10 mg total) by mouth every 8 (eight) hours as needed. 02/01/17   Rebecka Apley, MD    Allergies Patient has no known allergies.  Family History  Problem Relation Age of Onset  . Hypertension Other   . Cancer Other   . Hypertension Mother   . Hypertension Father   . Hypertension Maternal Grandmother   . Heart disease Maternal Grandmother   . Hearing loss Neg Hx     Social History Social History   Tobacco Use  . Smoking status: Current Some Day Smoker    Packs/day: 0.50    Years: 7.00    Pack years: 3.50    Types: Cigarettes  . Smokeless tobacco: Never Used  Substance Use Topics  . Alcohol use: No    Comment: occasionally  . Drug use: No    Review of Systems  Constitutional: No fever/chills Eyes: No visual changes. ENT: No sore throat. Cardiovascular: Denies chest pain. Respiratory: Denies shortness of breath. Gastrointestinal:  abdominal pain.  nausea,  vomiting.  diarrhea.  No constipation. Genitourinary: Negative for dysuria. Musculoskeletal: Negative for back pain. Skin: Negative for rash. Neurological: Negative for headaches, focal weakness or numbness.   ____________________________________________   PHYSICAL EXAM:  VITAL SIGNS: ED Triage Vitals  Enc Vitals Group     BP 02/01/17 0416 106/74     Pulse Rate 02/01/17 0416 (!) 56     Resp 02/01/17 0416 16     Temp 02/01/17 0416 98 F (36.7 C)     Temp Source 02/01/17 0416 Oral     SpO2 02/01/17 0416 98 %     Weight --      Height --      Head Circumference --      Peak Flow --      Pain Score 02/01/17 0417 6     Pain Loc --      Pain  Edu? --      Excl. in GC? --     Constitutional: Alert and oriented. Well appearing and in moderate distress. Eyes: Conjunctivae are normal. PERRL. EOMI. Head: Atraumatic. Nose: No congestion/rhinnorhea. Mouth/Throat: Mucous membranes are moist.  Oropharynx non-erythematous. Cardiovascular: Normal rate, regular rhythm. Grossly normal heart sounds.  Good peripheral circulation. Respiratory: Normal respiratory effort.  No retractions. Lungs CTAB. Gastrointestinal: Soft with upper abdominal tenderness to palpation. No distention.  Positive bowel sounds Musculoskeletal: No lower extremity tenderness nor edema.   Neurologic:  Normal speech and language.  Skin:  Skin is warm, dry and intact.  Psychiatric: Mood and affect are normal.   ____________________________________________   LABS (all labs ordered are listed, but only abnormal results are displayed)  Labs Reviewed  COMPREHENSIVE METABOLIC PANEL - Abnormal; Notable for the following components:      Result Value   Potassium 3.3 (*)    Chloride 97 (*)    Glucose, Bld 237 (*)    Total Protein 8.7 (*)    AST 58 (*)    ALT 77 (*)    All other components within normal limits  URINALYSIS, COMPLETE (UACMP) WITH MICROSCOPIC - Abnormal; Notable for the following components:   Color, Urine YELLOW (*)    APPearance CLOUDY (*)    Glucose, UA >=500 (*)    Hgb urine dipstick MODERATE (*)    Ketones, ur 5 (*)    Protein, ur 100 (*)    Bacteria, UA RARE (*)    Squamous Epithelial / LPF 0-5 (*)    All other components within normal limits  LIPASE, BLOOD  CBC  POC URINE PREG, ED  POCT PREGNANCY, URINE   ____________________________________________  EKG  none ____________________________________________  RADIOLOGY  No results found.  ____________________________________________   PROCEDURES  Procedure(s) performed: None  Procedures  Critical Care performed: No  ____________________________________________   INITIAL  IMPRESSION / ASSESSMENT AND PLAN / ED COURSE  As part of my medical decision making, I reviewed the following data within the electronic MEDICAL RECORD NUMBER Notes from prior ED visits and [place] Controlled Substance Database   This is a 32 year old female who comes into the hospital today with some nausea vomiting and diarrhea.  My differential diagnosis includes gastroenteritis, pancreatitis, cholecystitis.  We did check some blood work on the patient.  The patient CBC is unremarkable but she does have some mildly elevated AST and ALT.  The patient's lipase is negative.  Her urine is cloudy with glucose but no signs of infection.  The patient's blood sugar is also high.  We will give the patient a liter of normal  saline.  She had received some Zofran but started vomiting again so I did give her some Reglan.  The patient will be reassessed.     After receiving the Reglan the patient asked for ice chips and was able to eat some without any vomiting.  The patient states that her pain has gotten a little bit better in her upper abdomen and she has some intermittent lower abdominal sharp pains with coughing but nothing constant or severe.  I feel that the patient has some gastroenteritis.  She will be discharged home to follow-up with the acute care clinic.  The patient is to return with any worsening condition or any other concerns. ____________________________________________   FINAL CLINICAL IMPRESSION(S) / ED DIAGNOSES  Final diagnoses:  Nausea vomiting and diarrhea  Dehydration     ED Discharge Orders        Ordered    metoCLOPramide (REGLAN) 10 MG tablet  Every 8 hours PRN     02/01/17 0720       Note:  This document was prepared using Dragon voice recognition software and may include unintentional dictation errors.    Rebecka Apley, MD 02/01/17 [phone]

## 2017-02-01 NOTE — ED Triage Notes (Signed)
Patient arrived from home with co vomiting and diarrhea with no fever reported for the last 2 days.  She states her nose feels hot at times and she feels weak at times.  Pt reports not eating as much recently but has been trying to keep fluid down as much as possible.

## 2017-04-06 ENCOUNTER — Encounter: Payer: Self-pay | Admitting: *Deleted

## 2017-06-01 ENCOUNTER — Encounter: Payer: Self-pay | Admitting: Obstetrics and Gynecology

## 2017-10-19 ENCOUNTER — Encounter: Payer: Self-pay | Admitting: *Deleted

## 2019-01-19 ENCOUNTER — Ambulatory Visit (INDEPENDENT_AMBULATORY_CARE_PROVIDER_SITE_OTHER): Payer: No Typology Code available for payment source | Admitting: Adult Health

## 2019-01-19 ENCOUNTER — Other Ambulatory Visit: Payer: Self-pay

## 2019-01-19 ENCOUNTER — Encounter: Payer: Self-pay | Admitting: Adult Health

## 2019-01-19 VITALS — BP 132/86 | HR 89 | Temp 97.1°F | Resp 16 | Ht 63.25 in | Wt 244.6 lb

## 2019-01-19 DIAGNOSIS — L729 Follicular cyst of the skin and subcutaneous tissue, unspecified: Secondary | ICD-10-CM

## 2019-01-19 DIAGNOSIS — K219 Gastro-esophageal reflux disease without esophagitis: Secondary | ICD-10-CM

## 2019-01-19 DIAGNOSIS — Z13220 Encounter for screening for lipoid disorders: Secondary | ICD-10-CM

## 2019-01-19 DIAGNOSIS — Z1159 Encounter for screening for other viral diseases: Secondary | ICD-10-CM

## 2019-01-19 DIAGNOSIS — Z202 Contact with and (suspected) exposure to infections with a predominantly sexual mode of transmission: Secondary | ICD-10-CM

## 2019-01-19 DIAGNOSIS — M545 Low back pain, unspecified: Secondary | ICD-10-CM

## 2019-01-19 DIAGNOSIS — Z0000 Encounter for general adult medical examination without abnormal findings: Secondary | ICD-10-CM

## 2019-01-19 DIAGNOSIS — B3731 Acute candidiasis of vulva and vagina: Secondary | ICD-10-CM

## 2019-01-19 DIAGNOSIS — N898 Other specified noninflammatory disorders of vagina: Secondary | ICD-10-CM

## 2019-01-19 DIAGNOSIS — N393 Stress incontinence (female) (male): Secondary | ICD-10-CM | POA: Diagnosis not present

## 2019-01-19 DIAGNOSIS — Z6841 Body Mass Index (BMI) 40.0 and over, adult: Secondary | ICD-10-CM

## 2019-01-19 DIAGNOSIS — N39490 Overflow incontinence: Secondary | ICD-10-CM

## 2019-01-19 DIAGNOSIS — B373 Candidiasis of vulva and vagina: Secondary | ICD-10-CM

## 2019-01-19 DIAGNOSIS — Z1329 Encounter for screening for other suspected endocrine disorder: Secondary | ICD-10-CM

## 2019-01-19 DIAGNOSIS — K625 Hemorrhage of anus and rectum: Secondary | ICD-10-CM

## 2019-01-19 MED ORDER — CYCLOBENZAPRINE HCL 10 MG PO TABS: 10.0000 mg | ORAL_TABLET | Freq: Every day | ORAL | 0 refills | Status: DC

## 2019-01-19 MED ORDER — DOXYCYCLINE HYCLATE 100 MG PO TABS: 100.0000 mg | ORAL_TABLET | Freq: Two times a day (BID) | ORAL | 0 refills | Status: DC

## 2019-01-19 MED ORDER — PANTOPRAZOLE SODIUM 20 MG PO TBEC: 20.0000 mg | DELAYED_RELEASE_TABLET | Freq: Every day | ORAL | 1 refills | Status: DC

## 2019-01-19 MED ORDER — FLUCONAZOLE 150 MG PO TABS: ORAL_TABLET | ORAL | 0 refills | Status: DC

## 2019-01-19 NOTE — Progress Notes (Signed)
Patient: Bonnie Shepard, Female    DOB: 05/11/1985, 34 y.o.   MRN: [id number] Visit Date: 01/19/2019  Today's Provider: Jairo Ben, FNP   Chief Complaint  Patient presents with  . New Patient (Initial Visit)   Subjective:    New Patient Bonnie Shepard is a 34 y.o. female who presents today for health maintenance and complete physical. She feels fairly well. She reports she is exercising . She reports she is sleeping well.  ----------------------------------------------------------------- Hemorrhoids, abdominal hernia she questions, she noticed in September. Rectal bleeding on December 20-23 rd. Denies any bleeding since. She has pictures of that episode and toilet water was pink/ red. Red boood mixed with stool as well.    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. Denies any injury or trauma. She reports the pain is chronic. No saddle numbness. Denies radiculopathy and or paresthesia.  She has history of colon cancer in her family - grandmothers 2 sisters. DDenies any blood in urine. Denies any dysuria.   She is working out at gym in the morning but reports not losing weight. Weight gain within 4-5 months about 10 lbs.  Vaginal itching x 1 week,.. She reports Cowman/ discharge. No abnormal bleeding. She has stress incontinence. She would like to be tested for STDs. She denies any known exposure.   Last PAP around 3 years.  Chlamydia  Trichomonas in past none recent.   G3P1 miscarriage/ termination of third. She has a daughter.   She recently had 1st covid vaccine.  Patient's last menstrual period was 12/19/2018 (exact date). Feels like she is starting now.  No sex in 6 months.   Patient  denies any fever, body aches,chills, rash, chest pain, shortness of breath, nausea, vomiting, or diarrhea.   Review of Systems  Constitutional: Positive for fatigue. Negative for activity  change, appetite change, chills and diaphoresis.  HENT: Negative.   Eyes: Negative.   Respiratory: Negative.   Cardiovascular: Negative.   Gastrointestinal: Positive for anal bleeding, blood in stool and nausea. Negative for abdominal distention, abdominal pain, constipation, diarrhea, rectal pain and vomiting.  Endocrine: Negative.   Genitourinary: Positive for vaginal discharge. Negative for decreased urine volume, difficulty urinating, dyspareunia, dysuria, enuresis, flank pain, frequency, genital sores, hematuria, menstrual problem, pelvic pain, urgency, vaginal bleeding and vaginal pain.  Musculoskeletal: Positive for back pain. Negative for arthralgias, gait problem, joint swelling, myalgias, neck pain and neck stiffness.  Skin: Positive for color change (red bumps on back - present months ).  Neurological: Positive for headaches. Negative for dizziness, tremors, seizures, syncope, facial asymmetry, speech difficulty, weakness, light-headedness and numbness.  Hematological: Negative for adenopathy. Does not bruise/bleed easily.  Psychiatric/Behavioral: Negative.   All other systems reviewed and are negative.   Social History She  reports that she has been smoking cigarettes. She has a 3.50 pack-year smoking history. She has never used smokeless tobacco. She reports that she does not drink alcohol or use drugs. Social History   Socioeconomic History  . Marital status: Single    Spouse name: Not on file  . Number of children: Not on file  . Years of education: Not on file  . Highest education level: Not on file  Occupational History  . Not on file  Tobacco Use  . Smoking status: Current Some Day Smoker    Packs/day: 0.50    Years: 7.00  Pack years: 3.50    Types: Cigarettes  . Smokeless tobacco: Never Used  Substance and Sexual Activity  . Alcohol use: No    Comment: occasionally  . Drug use: No  . Sexual activity: Yes    Birth control/protection: None  Other Topics  Concern  . Not on file  Social History Narrative  . Not on file   Social Determinants of Health   Financial Resource Strain:   . Difficulty of Paying Living Expenses: Not on file  Food Insecurity:   . Worried About Programme researcher, broadcasting/film/video in the Last Year: Not on file  . Ran Out of Food in the Last Year: Not on file  Transportation Needs:   . Lack of Transportation (Medical): Not on file  . Lack of Transportation (Non-Medical): Not on file  Physical Activity:   . Days of Exercise per Week: Not on file  . Minutes of Exercise per Session: Not on file  Stress:   . Feeling of Stress : Not on file  Social Connections:   . Frequency of Communication with Friends and Family: Not on file  . Frequency of Social Gatherings with Friends and Family: Not on file  . Attends Religious Services: Not on file  . Active Member of Clubs or Organizations: Not on file  . Attends Banker Meetings: Not on file  . Marital Status: Not on file    Patient Active Problem List   Diagnosis Date Noted  . History of pregnancy loss in prior pregnancy, currently pregnant in second trimester 07/10/2016  . 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 DILATION AND EVACUATION N/A 09/07/2013   Procedure: DILATATION AND EVACUATION;  Surgeon: Freddrick March. Tenny Craw, MD;  Location: [facility];  Service: Gynecology;  Laterality: N/A;  . TONSILLECTOMY      Family History  Family Status  Relation Name Status  . Other  (Not Specified)  . Mother  (Not Specified)  . Father  (Not Specified)  . MGM  (Not Specified)  . Neg Hx  (Not Specified)   Her family history includes Cancer in an other family member; Heart disease in her maternal grandmother; Hypertension in her father, maternal grandmother, mother, and another family member.     No Known Allergies    Patient Care Team: Berniece Pap, FNP as PCP - General (Family Medicine)      Objective:    Vitals: BP 132/86   Pulse 89   Temp (!) 97.1 F (36.2 C) (Oral)   Resp 16   Ht 5' 3.25" (1.607 m)   Wt 244 lb 9.6 oz (110.9 kg)   LMP 12/19/2018 (Exact Date)   SpO2 98%   Breastfeeding No   BMI 42.99 kg/m    Physical Exam Vitals reviewed.  Constitutional:      General: She is not in acute distress.    Appearance: Normal appearance. She is well-developed. She is not ill-appearing, toxic-appearing or diaphoretic.     Interventions: She is not intubated.    Comments: Patient is alert and oriented and responsive to questions Engages in eye contact with provider. Speaks in full sentences without any pauses without any shortness of breath or distress.   HENT:     Head: Normocephalic and atraumatic.     Right Ear: External ear normal.     Left Ear: External ear normal.     Nose: Nose normal.     Mouth/Throat:  Mouth: Mucous membranes are moist.     Pharynx: No oropharyngeal exudate.  Eyes:     General: Lids are normal. No scleral icterus.       Right eye: No discharge.        Left eye: No discharge.     Conjunctiva/sclera: Conjunctivae normal.     Right eye: Right conjunctiva is not injected. No exudate or hemorrhage.    Left eye: Left conjunctiva is not injected. No exudate or hemorrhage.    Pupils: Pupils are equal, round, and reactive to light.  Neck:     Thyroid: No thyroid mass or thyromegaly.     Vascular: Normal carotid pulses. No carotid bruit, hepatojugular reflux or JVD.     Trachea: Trachea and phonation normal. No tracheal tenderness or tracheal deviation.     Meningeal: Brudzinski's sign and Kernig's sign absent.  Cardiovascular:     Rate and Rhythm: Normal rate and regular rhythm.     Pulses: Normal pulses.          Radial pulses are 2+ on the right side and 2+ on the left side.       Dorsalis pedis pulses are 2+ on the right side and 2+ on the left side.       Posterior tibial pulses are 2+ on the right side and 2+ on the left side.     Heart sounds:  Normal heart sounds, S1 normal and S2 normal. Heart sounds not distant. No murmur. No friction rub. No gallop.   Pulmonary:     Effort: Pulmonary effort is normal. No tachypnea, bradypnea, accessory muscle usage or respiratory distress. She is not intubated.     Breath sounds: Normal breath sounds. No stridor. No wheezing or rales.  Chest:     Chest wall: No tenderness.  Abdominal:     General: Bowel sounds are normal. There is no distension or abdominal bruit.     Palpations: Abdomen is soft. There is no shifting dullness, fluid wave, hepatomegaly, splenomegaly, mass or pulsatile mass.     Tenderness: There is no abdominal tenderness. There is no guarding or rebound.     Hernia: No hernia is present.  Musculoskeletal:        General: No swelling or deformity.     Cervical back: Normal, full passive range of motion without pain, normal range of motion and neck supple. No edema, erythema or rigidity. No spinous process tenderness or muscular tenderness. Normal range of motion.     Thoracic back: Normal.     Lumbar back: Spasms and tenderness present. No swelling, deformity, lacerations or bony tenderness. Normal range of motion. Negative right straight leg raise test and negative left straight leg raise test.       Back:     Right lower leg: No edema.     Left lower leg: No edema.     Comments: Lumbar sacral muscle spasms and mild tenderness bilateral    Lymphadenopathy:     Head:     Right side of head: No submental, submandibular, tonsillar, preauricular, posterior auricular or occipital adenopathy.     Left side of head: No submental, submandibular, tonsillar, preauricular, posterior auricular or occipital adenopathy.     Cervical: No cervical adenopathy.     Right cervical: No superficial, deep or posterior cervical adenopathy.    Left cervical: No superficial, deep or posterior cervical adenopathy.     Upper Body:     Right upper body: No supraclavicular or pectoral adenopathy.  Left upper body: No supraclavicular or pectoral adenopathy.  Skin:    General: Skin is warm and dry.     Capillary Refill: Capillary refill takes less than 2 seconds.     Coloration: Skin is not jaundiced or pale.     Findings: Acne present. No abrasion, bruising, burn, ecchymosis, erythema, lesion, petechiae or rash.     Nails: There is no clubbing.          Comments: Small cystic acne left back and upper back.   Neurological:     General: No focal deficit present.     Mental Status: She is alert and oriented to person, place, and time.     GCS: GCS eye subscore is 4. GCS verbal subscore is 5. GCS motor subscore is 6.     Cranial Nerves: Cranial nerves are intact. No cranial nerve deficit.     Sensory: Sensation is intact. No sensory deficit.     Motor: Motor function is intact. No weakness, tremor, atrophy, abnormal muscle tone or seizure activity.     Coordination: Coordination is intact. Coordination normal.     Gait: Gait is intact. Gait normal.     Deep Tendon Reflexes: Reflexes are normal and symmetric. Reflexes normal. Babinski sign absent on the right side. Babinski sign absent on the left side.     Reflex Scores:      Tricep reflexes are 2+ on the right side and 2+ on the left side.      Bicep reflexes are 2+ on the right side and 2+ on the left side.      Brachioradialis reflexes are 2+ on the right side and 2+ on the left side.      Patellar reflexes are 2+ on the right side and 2+ on the left side.      Achilles reflexes are 2+ on the right side and 2+ on the left side.    Comments: Patient moves on and off of exam table and in room without difficulty. Gait is normal in hall and in room. Patient is oriented to person place time and situation. Patient answers questions appropriately and engages in conversation.   Psychiatric:        Attention and Perception: Attention normal.        Mood and Affect: Mood normal.        Speech: Speech normal.        Behavior: Behavior  normal. Behavior is cooperative.        Thought Content: Thought content normal.        Cognition and Memory: Cognition normal.        Judgment: Judgment normal.      Depression Screen PHQ 2/9 Scores 01/19/2019 12/03/2015 10/16/2015 10/09/2015  PHQ - 2 Score 1 1 2 2   PHQ- 9 Score 9 7 9 8       Assessment & Plan:     Routine Health Maintenance and Physical Exam  Exercise Activities and Dietary recommendations Goals   None     Immunization History  Administered Date(s) Administered  . Tdap 09/18/2015    Health Maintenance  Topic Date Due  . HEMOGLOBIN A1C  01/29/1985  . PNEUMOCOCCAL POLYSACCHARIDE VACCINE AGE 49-64 HIGH RISK  09/18/1987  . FOOT EXAM  09/18/1995  . OPHTHALMOLOGY EXAM  09/18/1995  . URINE MICROALBUMIN  09/18/1995  . PAP SMEAR-Modifier  04/09/2018  . INFLUENZA VACCINE  08/06/2018  . TETANUS/TDAP  09/17/2025  . HIV Screening  Completed     Discussed  health benefits of physical activity, and encouraged her to engage in regular exercise appropriate for her age and condition.     Class 3 severe obesity due to excess calories without serious comorbidity with body mass index (BMI) of 40.0 to 44.9 in adult [facility])  STD exposure - Plan: RPR, HIV screening, Hepatitis C Screening [id number]  Vaginal discharge - Plan: Urine cytology ancillary only  Vaginal yeast infection - Plan: Urine cytology ancillary only  Need for hepatitis C screening test  Screening for thyroid disorder - Plan: TSH  Screening for hyperlipidemia - Plan: Lipid Panel w/o Chol/HDL Ratio  Rectal bleeding - Plan: Ambulatory referral to Gastroenterology  Acute midline low back pain without sciatica - Plan: Comprehensive Metabolic Panel (CMET), CBC with Differential/Platelet 005009  Skin cyst  Routine adult health maintenance  Will restart her Protonix, she has been off for a while. Given her family history and rectal bleeding will refer to GI for evaluation. Diflucan for suspected yeast  infection.  STD testing ordered.  Doxycycline for cystic acne of back.  She has used Flexeril in the past for her lower back pain with good relief.  Reviewed side effects or all medications below and RED FLAG's of diagnosis.   Meds ordered this encounter  Medications  . pantoprazole (PROTONIX) 20 MG tablet    Sig: Take 1 tablet (20 mg total) by mouth daily.    Dispense:  30 tablet    Refill:  1  . fluconazole (DIFLUCAN) 150 MG tablet    Sig: Take one tablet (150 mg) on day one by mouth.  May repeat 2nd dose on day 4.    Dispense:  2 tablet    Refill:  0  . doxycycline (VIBRA-TABS) 100 MG tablet    Sig: Take 1 tablet (100 mg total) by mouth 2 (two) times daily.    Dispense:  20 tablet    Refill:  0  . cyclobenzaprine (FLEXERIL) 10 MG tablet    Sig: Take 1 tablet (10 mg total) by mouth at bedtime.    Dispense:  30 tablet    Refill:  0   She will return in one month for follow up and PAP/ Vaginal exam/ Retal Exan as she is starting her menses today.   Return in about 1 month (around 02/19/2019), or if symptoms worsen or fail to improve, for at any time for any worsening symptoms, Go to Emergency room/ urgent care if worse. Advised patient call the office or your primary care doctor for an appointment if no improvement within 72 hours or if any symptoms change or worsen at any time  Advised ER or urgent Care if after hours or on weekend. Call 911 for emergency symptoms at any time.Patinet verbalized understanding of all instructions given/reviewed and treatment plan and has no further questions or concerns at this time.    Marland Kitchen --------------------------------------------------------------------

## 2019-01-19 NOTE — Patient Instructions (Addendum)
Acute Back Pain, Adult Acute back pain is sudden and usually short-lived. It is often caused by an injury to the muscles and tissues in the back. The injury may result from:  A muscle or ligament getting overstretched or torn (strained). Ligaments are tissues that connect bones to each other. Lifting something improperly can cause a back strain.  Wear and tear (degeneration) of the spinal disks. Spinal disks are circular tissue that provides cushioning between the bones of the spine (vertebrae).  Twisting motions, such as while playing sports or doing yard work.  A hit to the back.  Arthritis. You may have a physical exam, lab tests, and imaging tests to find the cause of your pain. Acute back pain usually goes away with rest and home care. Follow these instructions at home: Managing pain, stiffness, and swelling  Take over-the-counter and prescription medicines only as told by your health care provider.  Your health care provider may recommend applying ice during the first 24-48 hours after your pain starts. To do this: ? Put ice in a plastic bag. ? Place a towel between your skin and the bag. ? Leave the ice on for 20 minutes, 2-3 times a day.  If directed, apply heat to the affected area as often as told by your health care provider. Use the heat source that your health care provider recommends, such as a moist heat pack or a heating pad. ? Place a towel between your skin and the heat source. ? Leave the heat on for 20-30 minutes. ? Remove the heat if your skin turns bright red. This is especially important if you are unable to feel pain, heat, or cold. You have a greater risk of getting burned. Activity   Do not stay in bed. Staying in bed for more than 1-2 days can delay your recovery.  Sit up and stand up straight. Avoid leaning forward when you sit, or hunching over when you stand. ? If you work at a desk, sit close to it so you do not need to lean over. Keep your chin tucked  in. Keep your neck drawn back, and keep your elbows bent at a right angle. Your arms should look like the letter "L." ? Sit high and close to the steering wheel when you drive. Add lower back (lumbar) support to your car seat, if needed.  Take short walks on even surfaces as soon as you are able. Try to increase the length of time you walk each day.  Do not sit, drive, or stand in one place for more than 30 minutes at a time. Sitting or standing for long periods of time can put stress on your back.  Do not drive or use heavy machinery while taking prescription pain medicine.  Use proper lifting techniques. When you bend and lift, use positions that put less stress on your back: ? Bend your knees. ? Keep the load close to your body. ? Avoid twisting.  Exercise regularly as told by your health care provider. Exercising helps your back heal faster and helps prevent back injuries by keeping muscles strong and flexible.  Work with a physical therapist to make a safe exercise program, as recommended by your health care provider. Do any exercises as told by your physical therapist. Lifestyle  Maintain a healthy weight. Extra weight puts stress on your back and makes it difficult to have good posture.  Avoid activities or situations that make you feel anxious or stressed. Stress and anxiety increase muscle   tension and can make back pain worse. Learn ways to manage anxiety and stress, such as through exercise. General instructions  Sleep on a firm mattress in a comfortable position. Try lying on your side with your knees slightly bent. If you lie on your back, put a pillow under your knees.  Follow your treatment plan as told by your health care provider. This may include: ? Cognitive or behavioral therapy. ? Acupuncture or massage therapy. ? Meditation or yoga. Contact a health care provider if:  You have pain that is not relieved with rest or medicine.  You have increasing pain going down  into your legs or buttocks.  Your pain does not improve after 2 weeks.  You have pain at night.  You lose weight without trying.  You have a fever or chills. Get help right away if:  You develop new bowel or bladder control problems.  You have unusual weakness or numbness in your arms or legs.  You develop nausea or vomiting.  You develop abdominal pain.  You feel faint. Summary  Acute back pain is sudden and usually short-lived.  Use proper lifting techniques. When you bend and lift, use positions that put less stress on your back.  Take over-the-counter and prescription medicines and apply heat or ice as directed by your health care provider. This information is not intended to replace advice given to you by your health care provider. Make sure you discuss any questions you have with your health care provider. Document Revised: 04/12/2018 Document Reviewed: 08/05/2016 Elsevier Patient Education  2020 [employer] Muscle Strain A muscle strain is an injury that happens when a muscle is stretched longer than normal. This can happen during a fall, sports, or lifting. This can tear some muscle fibers. Usually, recovery from muscle strain takes 1-2 weeks. Complete healing normally takes 5-6 weeks. This condition is first treated with PRICE therapy. This involves:  Protecting your muscle from being injured again.  Resting your injured muscle.  Icing your injured muscle.  Applying pressure (compression) to your injured muscle. This may be done with a splint or elastic bandage.  Raising (elevating) your injured muscle. Your doctor may also recommend medicine for pain. Follow these instructions at home: If you have a splint:  Wear the splint as told by your doctor. Take it off only as told by your doctor.  Loosen the splint if your fingers or toes tingle, get numb, or turn cold and blue.  Keep the splint clean.  If the splint is not waterproof: ? Do not let it get  wet. ? Cover it with a watertight covering when you take a bath or a shower. Managing pain, stiffness, and swelling   If directed, put ice on your injured area. ? If you have a removable splint, take it off as told by your doctor. ? Put ice in a plastic bag. ? Place a towel between your skin and the bag. ? Leave the ice on for 20 minutes, 2-3 times a day.  Move your fingers or toes often. This helps to avoid stiffness and lessen swelling.  Raise your injured area above the level of your heart while you are sitting or lying down.  Wear an elastic bandage as told by your doctor. Make sure it is not too tight. General instructions  Take over-the-counter and prescription medicines only as told by your doctor.  Limit your activity. Rest your injured muscle as told by your doctor. Your doctor may say that gentle movements  are okay.  If physical therapy was prescribed, do exercises as told by your doctor.  Do not put pressure on any part of the splint until it is fully hardened. This may take many hours.  Do not use any products that contain nicotine or tobacco, such as cigarettes and e-cigarettes. These can delay bone healing. If you need help quitting, ask your doctor.  Warm up before you exercise. This helps to prevent more muscle strains.  Ask your doctor when it is safe to drive if you have a splint.  Keep all follow-up visits as told by your doctor. This is important. Contact a doctor if:  You have more pain or swelling in your injured area. Get help right away if:  You have any of these problems in your injured area: ? You have numbness. ? You have tingling. ? You lose a lot of strength. Summary  A muscle strain is an injury that happens when a muscle is stretched longer than normal.  This condition is first treated with PRICE therapy. This includes protecting, resting, icing, adding pressure, and raising your injury.  Limit your activity. Rest your injured muscle as  told by your doctor. Your doctor may say that gentle movements are okay.  Warm up before you exercise. This helps to prevent more muscle strains. This information is not intended to replace advice given to you by your health care provider. Make sure you discuss any questions you have with your health care provider. Document Revised: 02/17/2018 Document Reviewed: 01/29/2016 Elsevier Patient Education  2020 [employer] Vaginal Yeast Infection, Adult  Vaginal yeast infection is a condition that causes vaginal discharge as well as soreness, swelling, and redness (inflammation) of the vagina. This is a common condition. Some women get this infection frequently. What are the causes? This condition is caused by a change in the normal balance of the yeast (candida) and bacteria that live in the vagina. This change causes an overgrowth of yeast, which causes the inflammation. What increases the risk? The condition is more likely to develop in women who:  Take antibiotic medicines.  Have diabetes.  Take birth control pills.  Are pregnant.  Douche often.  Have a weak body defense system (immune system).  Have been taking steroid medicines for a long time.  Frequently wear tight clothing. What are the signs or symptoms? Symptoms of this condition include:  Federer, thick, creamy vaginal discharge.  Swelling, itching, redness, and irritation of the vagina. The lips of the vagina (vulva) may be affected as well.  Pain or a burning feeling while urinating.  Pain during sex. How is this diagnosed? This condition is diagnosed based on:  Your medical history.  A physical exam.  A pelvic exam. Your health care provider will examine a sample of your vaginal discharge under a microscope. Your health care provider may send this sample for testing to confirm the diagnosis. How is this treated? This condition is treated with medicine. Medicines may be over-the-counter or prescription. You  may be told to use one or more of the following:  Medicine that is taken by mouth (orally).  Medicine that is applied as a cream (topically).  Medicine that is inserted directly into the vagina (suppository). Follow these instructions at home:  Lifestyle  Do not have sex until your health care provider approves. Tell your sex partner that you have a yeast infection. That person should go to his or her health care provider and ask if they should also be  treated.  Do not wear tight clothes, such as pantyhose or tight pants.  Wear breathable cotton underwear. General instructions  Take or apply over-the-counter and prescription medicines only as told by your health care provider.  Eat more yogurt. This may help to keep your yeast infection from returning.  Do not use tampons until your health care provider approves.  Try taking a sitz bath to help with discomfort. This is a warm water bath that is taken while you are sitting down. The water should only come up to your hips and should cover your buttocks. Do this 3-4 times per day or as told by your health care provider.  Do not douche.  If you have diabetes, keep your blood sugar levels under control.  Keep all follow-up visits as told by your health care provider. This is important. Contact a health care provider if:  You have a fever.  Your symptoms go away and then return.  Your symptoms do not get better with treatment.  Your symptoms get worse.  You have new symptoms.  You develop blisters in or around your vagina.  You have blood coming from your vagina and it is not your menstrual period.  You develop pain in your abdomen. Summary  Vaginal yeast infection is a condition that causes discharge as well as soreness, swelling, and redness (inflammation) of the vagina.  This condition is treated with medicine. Medicines may be over-the-counter or prescription.  Take or apply over-the-counter and prescription  medicines only as told by your health care provider.  Do not douche. Do not have sex or use tampons until your health care provider approves.  Contact a health care provider if your symptoms do not get better with treatment or your symptoms go away and then return. This information is not intended to replace advice given to you by your health care provider. Make sure you discuss any questions you have with your health care provider. Document Revised: 07/22/2018 Document Reviewed: 05/10/2017 Elsevier Patient Education  [place] Maintenance, Female Adopting a healthy lifestyle and getting preventive care are important in promoting health and wellness. Ask your health care provider about:  The right schedule for you to have regular tests and exams.  Things you can do on your own to prevent diseases and keep yourself healthy. What should I know about diet, weight, and exercise? Eat a healthy diet   Eat a diet that includes plenty of vegetables, fruits, low-fat dairy products, and lean protein.  Do not eat a lot of foods that are high in solid fats, added sugars, or sodium. Maintain a healthy weight Body mass index (BMI) is used to identify weight problems. It estimates body fat based on height and weight. Your health care provider can help determine your BMI and help you achieve or maintain a healthy weight. Get regular exercise Get regular exercise. This is one of the most important things you can do for your health. Most adults should:  Exercise for at least 150 minutes each week. The exercise should increase your heart rate and make you sweat (moderate-intensity exercise).  Do strengthening exercises at least twice a week. This is in addition to the moderate-intensity exercise.  Spend less time sitting. Even light physical activity can be beneficial. Watch cholesterol and blood lipids Have your blood tested for lipids and cholesterol at 34 years of age, then have this  test every 5 years. Have your cholesterol levels checked more often if:  Your lipid or cholesterol  levels are high.  You are older than 34 years of age.  You are at high risk for heart disease. What should I know about cancer screening? Depending on your health history and family history, you may need to have cancer screening at various ages. This may include screening for:  Breast cancer.  Cervical cancer.  Colorectal cancer.  Skin cancer.  Lung cancer. What should I know about heart disease, diabetes, and high blood pressure? Blood pressure and heart disease  High blood pressure causes heart disease and increases the risk of stroke. This is more likely to develop in people who have high blood pressure readings, are of African descent, or are overweight.  Have your blood pressure checked: ? Every 3-5 years if you are 87-69 years of age. ? Every year if you are 48 years old or older. Diabetes Have regular diabetes screenings. This checks your fasting blood sugar level. Have the screening done:  Once every three years after age 27 if you are at a normal weight and have a low risk for diabetes.  More often and at a younger age if you are overweight or have a high risk for diabetes. What should I know about preventing infection? Hepatitis B If you have a higher risk for hepatitis B, you should be screened for this virus. Talk with your health care provider to find out if you are at risk for hepatitis B infection. Hepatitis C Testing is recommended for:  Everyone born from [birth year] through 1965.  Anyone with known risk factors for hepatitis C. Sexually transmitted infections (STIs)  Get screened for STIs, including gonorrhea and chlamydia, if: ? You are sexually active and are younger than 34 years of age. ? You are older than 34 years of age and your health care provider tells you that you are at risk for this type of infection. ? Your sexual activity has changed since you  were last screened, and you are at increased risk for chlamydia or gonorrhea. Ask your health care provider if you are at risk.  Ask your health care provider about whether you are at high risk for HIV. Your health care provider may recommend a prescription medicine to help prevent HIV infection. If you choose to take medicine to prevent HIV, you should first get tested for HIV. You should then be tested every 3 months for as long as you are taking the medicine. Pregnancy  If you are about to stop having your period (premenopausal) and you may become pregnant, seek counseling before you get pregnant.  Take 400 to 800 micrograms (mcg) of folic acid every day if you become pregnant.  Ask for birth control (contraception) if you want to prevent pregnancy. Osteoporosis and menopause Osteoporosis is a disease in which the bones lose minerals and strength with aging. This can result in bone fractures. If you are 47 years old or older, or if you are at risk for osteoporosis and fractures, ask your health care provider if you should:  Be screened for bone loss.  Take a calcium or vitamin D supplement to lower your risk of fractures.  Be given hormone replacement therapy (HRT) to treat symptoms of menopause. Follow these instructions at home: Lifestyle  Do not use any products that contain nicotine or tobacco, such as cigarettes, e-cigarettes, and chewing tobacco. If you need help quitting, ask your health care provider.  Do not use street drugs.  Do not share needles.  Ask your health care provider for help if  you need support or information about quitting drugs. Alcohol use  Do not drink alcohol if: ? Your health care provider tells you not to drink. ? You are pregnant, may be pregnant, or are planning to become pregnant.  If you drink alcohol: ? Limit how much you use to 0-1 drink a day. ? Limit intake if you are breastfeeding.  Be aware of how much alcohol is in your drink. In the  [place] one drink equals one 12 oz bottle of beer (355 mL), one 5 oz glass of wine (148 mL), or one 1 oz glass of hard liquor (44 mL). General instructions  Schedule regular health, dental, and eye exams.  Stay current with your vaccines.  Tell your health care provider if: ? You often feel depressed. ? You have ever been abused or do not feel safe at home. Summary  Adopting a healthy lifestyle and getting preventive care are important in promoting health and wellness.  Follow your health care provider's instructions about healthy diet, exercising, and getting tested or screened for diseases.  Follow your health care provider's instructions on monitoring your cholesterol and blood pressure. This information is not intended to replace advice given to you by your health care provider. Make sure you discuss any questions you have with your health care provider. Document Revised: 12/15/2017 Document Reviewed: 12/15/2017 Elsevier Patient Education  2020 [employer]

## 2019-01-21 LAB — COMPREHENSIVE METABOLIC PANEL
ALT: 76 IU/L — ABNORMAL HIGH (ref 0–32)
AST: 42 IU/L — ABNORMAL HIGH (ref 0–40)
Albumin/Globulin Ratio: 1.8 (ref 1.2–2.2)
Albumin: 4.6 g/dL (ref 3.8–4.8)
Alkaline Phosphatase: 110 IU/L (ref 39–117)
BUN/Creatinine Ratio: 25 — ABNORMAL HIGH (ref 9–23)
BUN: 16 mg/dL (ref 6–20)
Bilirubin Total: 0.4 mg/dL (ref 0.0–1.2)
CO2: 22 mmol/L (ref 20–29)
Calcium: 9.7 mg/dL (ref 8.7–10.2)
Chloride: 102 mmol/L (ref 96–106)
Creatinine, Ser: 0.63 mg/dL (ref 0.57–1.00)
GFR calc Af Amer: 136 mL/min/{1.73_m2} (ref >59)
GFR calc non Af Amer: 118 mL/min/{1.73_m2} (ref >59)
Globulin, Total: 2.6 g/dL (ref 1.5–4.5)
Glucose: 115 mg/dL — ABNORMAL HIGH (ref 65–99)
Potassium: 4.1 mmol/L (ref 3.5–5.2)
Sodium: 140 mmol/L (ref 134–144)
Total Protein: 7.2 g/dL (ref 6.0–8.5)

## 2019-01-21 LAB — LIPID PANEL W/O CHOL/HDL RATIO
Cholesterol, Total: 181 mg/dL (ref 100–199)
HDL: 38 mg/dL — ABNORMAL LOW (ref >39)
LDL Chol Calc (NIH): 126 mg/dL — ABNORMAL HIGH (ref 0–99)
Triglycerides: 92 mg/dL (ref 0–149)
VLDL Cholesterol Cal: 17 mg/dL (ref 5–40)

## 2019-01-21 LAB — CBC WITH DIFFERENTIAL/PLATELET
Basophils Absolute: 0 10*3/uL (ref 0.0–0.2)
Basos: 0 %
EOS (ABSOLUTE): 0.2 10*3/uL (ref 0.0–0.4)
Eos: 2 %
Hematocrit: 41.6 % (ref 34.0–46.6)
Hemoglobin: 13.9 g/dL (ref 11.1–15.9)
Immature Grans (Abs): 0 10*3/uL (ref 0.0–0.1)
Immature Granulocytes: 0 %
Lymphocytes Absolute: 3 10*3/uL (ref 0.7–3.1)
Lymphs: 39 %
MCH: 30.7 pg (ref 26.6–33.0)
MCHC: 33.4 g/dL (ref 31.5–35.7)
MCV: 92 fL (ref 79–97)
Monocytes Absolute: 0.6 10*3/uL (ref 0.1–0.9)
Monocytes: 7 %
Neutrophils Absolute: 4 10*3/uL (ref 1.4–7.0)
Neutrophils: 52 %
Platelets: 252 10*3/uL (ref 150–450)
RBC: 4.53 x10E6/uL (ref 3.77–5.28)
RDW: 11.9 % (ref 11.7–15.4)
WBC: 7.7 10*3/uL (ref 3.4–10.8)

## 2019-01-21 LAB — RPR: RPR Ser Ql: NONREACTIVE

## 2019-01-21 LAB — HIV ANTIBODY (ROUTINE TESTING W REFLEX): HIV Screen 4th Generation wRfx: NONREACTIVE

## 2019-01-21 LAB — TSH: TSH: 1.16 u[IU]/mL (ref 0.450–4.500)

## 2019-01-21 LAB — HEPATITIS C ANTIBODY (REFLEX): HCV Ab: <0.1 s/co ratio (ref 0.0–0.9)

## 2019-01-21 LAB — HCV COMMENT:

## 2019-01-22 ENCOUNTER — Encounter: Payer: Self-pay | Admitting: Adult Health

## 2019-01-25 ENCOUNTER — Encounter: Payer: Self-pay | Admitting: Advanced Practice Midwife

## 2019-01-25 LAB — HEPATITIS PANEL, ACUTE
Hep A IgM: NEGATIVE
Hep B C IgM: NEGATIVE
Hep C Virus Ab: <0.1 s/co ratio (ref 0.0–0.9)
Hepatitis B Surface Ag: NEGATIVE

## 2019-01-25 LAB — SPECIMEN STATUS REPORT

## 2019-02-21 ENCOUNTER — Other Ambulatory Visit: Payer: Self-pay

## 2019-02-21 ENCOUNTER — Ambulatory Visit
Admission: RE | Admit: 2019-02-21 | Discharge: 2019-02-21 | Disposition: A | Discharge status: Home / Self Care | Payer: No Typology Code available for payment source | Source: Ambulatory Visit | Attending: Adult Health | Admitting: Adult Health

## 2019-02-21 ENCOUNTER — Encounter: Payer: Self-pay | Admitting: Adult Health

## 2019-02-21 ENCOUNTER — Ambulatory Visit (INDEPENDENT_AMBULATORY_CARE_PROVIDER_SITE_OTHER): Payer: No Typology Code available for payment source | Admitting: Adult Health

## 2019-02-21 ENCOUNTER — Other Ambulatory Visit (HOSPITAL_COMMUNITY)
Admission: RE | Admit: 2019-02-21 | Discharge: 2019-02-21 | Disposition: A | Discharge status: Home / Self Care | Payer: No Typology Code available for payment source | Source: Ambulatory Visit | Attending: Adult Health | Admitting: Adult Health

## 2019-02-21 VITALS — BP 120/84 | HR 51 | Temp 96.8°F | Resp 15 | Wt 242.4 lb

## 2019-02-21 DIAGNOSIS — N898 Other specified noninflammatory disorders of vagina: Secondary | ICD-10-CM | POA: Diagnosis not present

## 2019-02-21 DIAGNOSIS — Z124 Encounter for screening for malignant neoplasm of cervix: Secondary | ICD-10-CM | POA: Diagnosis not present

## 2019-02-21 DIAGNOSIS — M79672 Pain in left foot: Secondary | ICD-10-CM | POA: Diagnosis not present

## 2019-02-21 DIAGNOSIS — Z6841 Body Mass Index (BMI) 40.0 and over, adult: Secondary | ICD-10-CM

## 2019-02-21 DIAGNOSIS — F418 Other specified anxiety disorders: Secondary | ICD-10-CM | POA: Diagnosis not present

## 2019-02-21 MED ORDER — NYSTATIN 100000 UNIT/GM EX CREA: 1.0000 "application " | TOPICAL_CREAM | Freq: Two times a day (BID) | CUTANEOUS | 0 refills | Status: DC

## 2019-02-21 MED ORDER — ESCITALOPRAM OXALATE 10 MG PO TABS: 10.0000 mg | ORAL_TABLET | Freq: Every day | ORAL | 0 refills | Status: DC

## 2019-02-21 NOTE — Progress Notes (Signed)
Patient: Bonnie Shepard Female    DOB: August 20, 1985   34 y.o.   MRN: [id number] Visit Date: 02/21/2019  Today's Provider: Jairo Ben, FNP   Chief Complaint  Patient presents with  . Gynecologic Exam   Subjective:     HPI Patient returns to office today for gynecology examination.She would also like to address left foot pain.   Last PAP around 3 years. Here to do today. She still has mild vaginal itching/ irritation with ocasioinal scant [place] discharge.  " on the outside" she reports it did all improve with her most recent Diflucan prescribed at last visit. She would like testing by PAP today for STD's. She is not currently sexually active. She has no other concerns. NO abnormal vaginal bleeding.  Chlamydia  Trichomonas in past none recent.  Denies any rectal pain.  She sees GI for rectal bleeding 03/28/2019 denies any episodes since last reported.  She is working on her weight and increasing exercise.   Back pain has improved with Flexeril - she declines any new or worsening symptoms. She will follow follow up as needed.  She thinks she had pain in her heel of her left foot for around 6 months. She has not had any known injury recently but does feel she fractured this foot in highschool. Notices pain when on her feet long periods of time in area on left foot.   She would like to start Lexapro as discussed at last visit. She says she feels like she wants to give it a try. She says her mom notices that she may could benefit from it. She denies any suicidal or homicidal ideations or intents.  Patient  denies any fever, body aches,chills, rash, chest pain, shortness of breath, nausea, vomiting, or diarrhea.   No Known Allergies   Current Outpatient Medications:  .  cyclobenzaprine (FLEXERIL) 10 MG tablet, Take 1 tablet (10 mg total) by mouth at bedtime., Disp: 30 tablet, Rfl: 0 .  doxycycline (VIBRA-TABS) 100 MG tablet, Take 1 tablet (100 mg total) by mouth 2 (two) times  daily., Disp: 20 tablet, Rfl: 0 .  fluconazole (DIFLUCAN) 150 MG tablet, Take one tablet (150 mg) on day one by mouth.  May repeat 2nd dose on day 4., Disp: 2 tablet, Rfl: 0 .  pantoprazole (PROTONIX) 20 MG tablet, Take 1 tablet (20 mg total) by mouth daily., Disp: 30 tablet, Rfl: 1  Review of Systems  Constitutional: Negative.   HENT: Negative.   Respiratory: Negative.   Cardiovascular: Negative.   Gastrointestinal: Negative.   Genitourinary: Positive for vaginal discharge. Negative for decreased urine volume, difficulty urinating, dyspareunia, dysuria, enuresis, flank pain, frequency, genital sores, hematuria, menstrual problem, pelvic pain, urgency, vaginal bleeding and vaginal pain.  Musculoskeletal: Positive for arthralgias (left foot heel pain x 6 months. ), back pain and gait problem (occasionally hurts with walking left heel, new shoes seemed to have helped some and she also feels she had a histoy of fracture in this foot but is not certain which foot. ). Negative for joint swelling, myalgias, neck pain and neck stiffness.  Skin: Negative.   Neurological: Negative for dizziness, tremors, seizures, syncope, facial asymmetry, speech difficulty, weakness, light-headedness, numbness and headaches.  Hematological: Negative.   Psychiatric/Behavioral: Negative.     Social History   Tobacco Use  . Smoking status: Current Some Day Smoker    Packs/day: 0.50    Years: 7.00    Pack years: 3.50    Types:  Cigarettes  . Smokeless tobacco: Never Used  Substance Use Topics  . Alcohol use: No    Comment: occasionally      Objective:    Physical Exam Nursing note reviewed.  Constitutional:      General: She is not in acute distress.    Appearance: Normal appearance. She is obese. She is not ill-appearing, toxic-appearing or diaphoretic.  HENT:     Head: Normocephalic and atraumatic.     Nose: Nose normal. No congestion or rhinorrhea.     Mouth/Throat:     Mouth: Mucous membranes are  moist.  Eyes:     Pupils: Pupils are equal, round, and reactive to light.  Neck:     Vascular: No carotid bruit.  Cardiovascular:     Rate and Rhythm: Normal rate and regular rhythm.     Pulses: Normal pulses.     Heart sounds: Normal heart sounds. No murmur. No friction rub. No gallop.   Pulmonary:     Effort: Pulmonary effort is normal. No respiratory distress.     Breath sounds: Normal breath sounds. No stridor. No wheezing, rhonchi or rales.  Chest:     Chest wall: No tenderness.  Abdominal:     Palpations: Abdomen is soft.  Genitourinary:    General: Normal vulva.     Exam position: Lithotomy position.     Pubic Area: No rash or pubic lice.      Tanner stage (genital): 5.     Labia:        Right: Rash (mild erythema to labia majora ) present. No tenderness, lesion or injury.        Left: Rash present. No tenderness, lesion or injury.      Urethra: No prolapse, urethral pain, urethral swelling or urethral lesion.     Vagina: No signs of injury and foreign body. Vaginal discharge (scant [place] ) present. No erythema, tenderness, bleeding, lesions or prolapsed vaginal walls.     Cervix: Normal.     Uterus: Normal.      Adnexa: Right adnexa normal and left adnexa normal.     Rectum: Normal.     Comments: No visualized hemorrhoids external.  Musculoskeletal:     Cervical back: Normal range of motion and neck supple. No rigidity or tenderness.     Right foot: Normal range of motion. No deformity, bunion, Charcot foot, foot drop or prominent metatarsal heads.     Left foot: Normal range of motion. No deformity, bunion, Charcot foot, foot drop or prominent metatarsal heads.       Feet:  Feet:     Right foot:     Protective Sensation: 2 sites tested. 2 sites sensed.     Skin integrity: Skin integrity normal.     Left foot:     Protective Sensation: 2 sites tested. 2 sites sensed.     Skin integrity: Skin integrity normal.     Comments: Mild swelling, no foreign body visualized,  tender. No erythema or skin lesion.  Lymphadenopathy:     Cervical: No cervical adenopathy.  Skin:    General: Skin is warm and dry.     Capillary Refill: Capillary refill takes less than 2 seconds.  Neurological:     General: No focal deficit present.     Mental Status: She is alert and oriented to person, place, and time.  Psychiatric:        Mood and Affect: Mood normal.        Behavior: Behavior normal.  Thought Content: Thought content normal.        Judgment: Judgment normal.    Depression screen [facility] 2/9 01/19/2019 12/03/2015 10/16/2015 10/09/2015 08/28/2015  Decreased Interest 0 0 1 1 1   Down, Depressed, Hopeless 1 1 1 1 1   PHQ - 2 Score 1 1 2 2 2   Altered sleeping 3 0 2 2 2   Tired, decreased energy 3 2 2 2 3   Change in appetite 2 1 1 1 1   Feeling bad or failure about yourself  0 0 0 0 1  Trouble concentrating 0 2 1 1 1   Moving slowly or fidgety/restless 0 1 1 0 1  Suicidal thoughts 0 0 0 0 0  PHQ-9 Score 9 7 9 8 11   Difficult doing work/chores Not difficult at all - - - -   GAD 7 : Generalized Anxiety Score 12/03/2015 10/16/2015 10/09/2015 08/28/2015  Nervous, Anxious, on Edge 1 2 2 2   Control/stop worrying 1 2 1 3   Worry too much - different things 1 2 1 3   Trouble relaxing 1 2 1 2   Restless 1 2 1 1   Easily annoyed or irritable 1 2 2 3   Afraid - awful might happen 1 1 1 2   Total GAD 7 Score 7 13 9 16       No results found for any visits on 02/21/19.     Assessment & Plan      1. Cervical cancer screening  - Cytology - PAP  2. Pain of left heel Apply a compressive ACE bandage. Rest and elevate the affected painful area.  Apply cold compresses intermittently as needed.  As pain recedes, begin normal activities slowly as tolerated.  Call if symptoms persist.  - DG Foot Complete Left; Future  3. Morbid obesity (HCC) Encourage weight loss, exercise and healthy diet.   4. Itching in the vaginal area Suspect external yeast, will give Nystatin. Return  if not improving for recheck.  Meds ordered this encounter  Medications  . escitalopram (LEXAPRO) 10 MG tablet    Sig: Take 1 tablet (10 mg total) by mouth daily.    Dispense:  30 tablet    Refill:  0  . nystatin cream (MYCOSTATIN)    Sig: Apply 1 application topically 2 (two) times daily. To area of concern.    Dispense:  30 g    Refill:  0   5. BMI 40.0-44.9, adult (HCC) Healthy lifestyle, diet and increased exercise.   6. Anxiety with depression Lexapro as above with follow up in one month. Black box warning of Lexapro and possible other side effects discussed and when to seek care immediately.   Return in about 1 month (around 03/21/2019), or if symptoms worsen or fail to improve, for at any time for any worsening symptoms, Go to Emergency room/ urgent care if worse.  Please schedule a follow up appointment in 1 month.   The entirety of the information documented in the History of Present Illness, Review of Systems and Physical Exam were personally obtained by me. Portions of this information were initially documented by the  Certified Medical Assistant whose name is documented in Epic and reviewed by me for thoroughness and accuracy.  I have personally performed the exam and reviewed the chart and it is accurate to the best of my knowledge.  has been used and any errors in dictation or transcription are unintentional.  12/05/2015. Flinchum FNP-C  [facility]  12/09/2015 [place],  [facility]

## 2019-02-23 DIAGNOSIS — F418 Other specified anxiety disorders: Secondary | ICD-10-CM | POA: Insufficient documentation

## 2019-02-23 DIAGNOSIS — M79672 Pain in left foot: Secondary | ICD-10-CM | POA: Insufficient documentation

## 2019-02-23 DIAGNOSIS — Z6841 Body Mass Index (BMI) 40.0 and over, adult: Secondary | ICD-10-CM | POA: Insufficient documentation

## 2019-02-23 DIAGNOSIS — Z124 Encounter for screening for malignant neoplasm of cervix: Secondary | ICD-10-CM | POA: Insufficient documentation

## 2019-02-23 NOTE — Patient Instructions (Signed)
Heel Spur  A heel spur is a bony growth that forms on the bottom of the heel bone (calcaneus). Heel spurs are common. They often cause inflammation in the band of tissue that connects the toes to the heel bone (plantar fascia). This may cause pain on the bottom of the foot, near the heel. Many people with plantar fasciitis also have heel spurs. However, spurs are not the cause of plantar fasciitis pain. What are the causes? The exact cause of heel spurs is not known. They may be caused by:  Pressure on the heel bone.  Bands of tissue (tendons) pulling on the heel bone. What increases the risk? You are more likely to develop this condition if you:  Are older than 40.  Are overweight.  Have wear-and-tear arthritis (osteoarthritis).  Have plantar fascia inflammation.  Participate in sports or activities that include a lot of running or jumping.  Wear poorly fitted shoes. What are the signs or symptoms? Some people have no symptoms. If you do have symptoms, they may include:  Pain in the bottom of your heel.  Pain that is worse when you first get out of bed.  Pain that gets worse after walking or standing. How is this diagnosed? This condition may be diagnosed based on:  Your symptoms and medical history.  A physical exam.  A foot X-ray. How is this treated? Treatment for this condition depends on how much pain you have. Treatment options may include:  Doing stretching exercises.  Losing weight, if necessary.  Wearing specific shoes or inserts inside of shoes (orthotics) for comfort and support.  Wearing splints on your feet while you sleep. Splints keep your feet in a position (usually 90 degrees) that should prevent and relieve the pain you feel when you first get out of bed. They also make stretching easier in the morning.  Taking over-the-counter medicine to relieve pain, such as NSAIDs.  Using high-intensity sound waves to break up the heel spur (extracorporeal  shock wave therapy).  Getting steroid injections in your heel to reduce inflammation.  Having surgery, if your heel spur causes long-term (chronic) pain. Follow these instructions at home:  Activity  Avoid activities that cause pain until you recover, or for as long as directed by your health care provider.  Do stretching exercises as directed. Stretch before exercising or being physically active. Managing pain, stiffness, and swelling  If directed, put ice on your foot: ? Put ice in a plastic bag. ? Place a towel between your skin and the bag. ? Leave the ice on for 20 minutes, 2-3 times a day.  Move your toes often to avoid stiffness and to lessen swelling.  When possible, raise (elevate) your foot above the level of your heart while you are sitting or lying down. General instructions  Take over-the-counter and prescription medicines only as told by your health care provider.  Wear supportive shoes that fit well. Wear splints, inserts, or orthotics as told by your health care provider.  If recommended, work with your health care provider to lose weight. This can relieve pressure on your foot.  Do not use any products that contain nicotine or tobacco, such as cigarettes and e-cigarettes. These can affect bone growth and healing. If you need help quitting, ask your health care provider.  Keep all follow-up visits as told by your health care provider. This is important. Contact a health care provider if:  Your pain does not go away with treatment.  Your pain gets  worse. Summary  A heel spur is a bony growth that forms on the bottom of the heel bone (calcaneus).  Heel spurs often cause inflammation in the band of tissue that connects the toes to the heel bone (plantar fascia). This may cause pain on the bottom of the foot, near the heel.  Doing stretching exercises, losing weight, wearing specific shoes or shoe inserts, wearing splints while you sleep, and taking pain  medicine may ease the pain and stiffness.  Other treatment options may include high-intensity sound waves to break up the heel spur, steroid injections, or surgery. This information is not intended to replace advice given to you by your health care provider. Make sure you discuss any questions you have with your health care provider. Document Revised: 12/09/2016 Document Reviewed: 12/09/2016 Elsevier Patient Education  2020 [employer] Generalized Anxiety Disorder, Adult Generalized anxiety disorder (GAD) is a mental health disorder. People with this condition constantly worry about everyday events. Unlike normal anxiety, worry related to GAD is not triggered by a specific event. These worries also do not fade or get better with time. GAD interferes with life functions, including relationships, work, and school. GAD can vary from mild to severe. People with severe GAD can have intense waves of anxiety with physical symptoms (panic attacks). What are the causes? The exact cause of GAD is not known. What increases the risk? This condition is more likely to develop in:  Women.  People who have a family history of anxiety disorders.  People who are very shy.  People who experience very stressful life events, such as the death of a loved one.  People who have a very stressful family environment. What are the signs or symptoms? People with GAD often worry excessively about many things in their lives, such as their health and family. They may also be overly concerned about:  Doing well at work.  Being on time.  Natural disasters.  Friendships. Physical symptoms of GAD include:  Fatigue.  Muscle tension or having muscle twitches.  Trembling or feeling shaky.  Being easily startled.  Feeling like your heart is pounding or racing.  Feeling out of breath or like you cannot take a deep breath.  Having trouble falling asleep or staying asleep.  Sweating.  Nausea, diarrhea, or  irritable bowel syndrome (IBS).  Headaches.  Trouble concentrating or remembering facts.  Restlessness.  Irritability. How is this diagnosed? Your health care provider can diagnose GAD based on your symptoms and medical history. You will also have a physical exam. The health care provider will ask specific questions about your symptoms, including how severe they are, when they started, and if they come and go. Your health care provider may ask you about your use of alcohol or drugs, including prescription medicines. Your health care provider may refer you to a mental health specialist for further evaluation. Your health care provider will do a thorough examination and may perform additional tests to rule out other possible causes of your symptoms. To be diagnosed with GAD, a person must have anxiety that:  Is out of his or her control.  Affects several different aspects of his or her life, such as work and relationships.  Causes distress that makes him or her unable to take part in normal activities.  Includes at least three physical symptoms of GAD, such as restlessness, fatigue, trouble concentrating, irritability, muscle tension, or sleep problems. Before your health care provider can confirm a diagnosis of GAD, these symptoms must be  present more days than they are not, and they must last for six months or longer. How is this treated? The following therapies are usually used to treat GAD:  Medicine. Antidepressant medicine is usually prescribed for long-term daily control. Antianxiety medicines may be added in severe cases, especially when panic attacks occur.  Talk therapy (psychotherapy). Certain types of talk therapy can be helpful in treating GAD by providing support, education, and guidance. Options include: ? Cognitive behavioral therapy (CBT). People learn coping skills and techniques to ease their anxiety. They learn to identify unrealistic or negative thoughts and behaviors  and to replace them with positive ones. ? Acceptance and commitment therapy (ACT). This treatment teaches people how to be mindful as a way to cope with unwanted thoughts and feelings. ? Biofeedback. This process trains you to manage your body's response (physiological response) through breathing techniques and relaxation methods. You will work with a therapist while machines are used to monitor your physical symptoms.  Stress management techniques. These include yoga, meditation, and exercise. A mental health specialist can help determine which treatment is best for you. Some people see improvement with one type of therapy. However, other people require a combination of therapies. Follow these instructions at home:  Take over-the-counter and prescription medicines only as told by your health care provider.  Try to maintain a normal routine.  Try to anticipate stressful situations and allow extra time to manage them.  Practice any stress management or self-calming techniques as taught by your health care provider.  Do not punish yourself for setbacks or for not making progress.  Try to recognize your accomplishments, even if they are small.  Keep all follow-up visits as told by your health care provider. This is important. Contact a health care provider if:  Your symptoms do not get better.  Your symptoms get worse.  You have signs of depression, such as: ? A persistently sad, cranky, or irritable mood. ? Loss of enjoyment in activities that used to bring you joy. ? Change in weight or eating. ? Changes in sleeping habits. ? Avoiding friends or family members. ? Loss of energy for normal tasks. ? Feelings of guilt or worthlessness. Get help right away if:  You have serious thoughts about hurting yourself or others. If you ever feel like you may hurt yourself or others, or have thoughts about taking your own life, get help right away. You can go to your nearest emergency  department or call:  Your local emergency services (911 in the [place]  A suicide crisis helpline, such as the National Suicide Prevention Lifeline at 4051818272. This is open 24 hours a day. Summary  Generalized anxiety disorder (GAD) is a mental health disorder that involves worry that is not triggered by a specific event.  People with GAD often worry excessively about many things in their lives, such as their health and family.  GAD may cause physical symptoms such as restlessness, trouble concentrating, sleep problems, frequent sweating, nausea, diarrhea, headaches, and trembling or muscle twitching.  A mental health specialist can help determine which treatment is best for you. Some people see improvement with one type of therapy. However, other people require a combination of therapies. This information is not intended to replace advice given to you by your health care provider. Make sure you discuss any questions you have with your health care provider. Document Revised: 12/04/2016 Document Reviewed: 11/12/2015 Elsevier Patient Education  2020 [employer]. Escitalopram tablets What is this medicine? ESCITALOPRAM (es sye  TAL oh pram) is used to treat depression and certain types of anxiety. This medicine may be used for other purposes; ask your health care provider or pharmacist if you have questions. COMMON BRAND NAME(S): Lexapro What should I tell my health care provider before I take this medicine? They need to know if you have any of these conditions:  bipolar disorder or a family history of bipolar disorder  diabetes  glaucoma  heart disease  kidney or liver disease  receiving electroconvulsive therapy  seizures (convulsions)  suicidal thoughts, plans, or attempt by you or a family member  an unusual or allergic reaction to escitalopram, the related drug citalopram, other medicines, foods, dyes, or preservatives  pregnant or trying to become  pregnant  breast-feeding How should I use this medicine? Take this medicine by mouth with a glass of water. Follow the directions on the prescription label. You can take it with or without food. If it upsets your stomach, take it with food. Take your medicine at regular intervals. Do not take it more often than directed. Do not stop taking this medicine suddenly except upon the advice of your doctor. Stopping this medicine too quickly may cause serious side effects or your condition may worsen. A special MedGuide will be given to you by the pharmacist with each prescription and refill. Be sure to read this information carefully each time. Talk to your pediatrician regarding the use of this medicine in children. Special care may be needed. Overdosage: If you think you have taken too much of this medicine contact a poison control center or emergency room at once. NOTE: This medicine is only for you. Do not share this medicine with others. What if I miss a dose? If you miss a dose, take it as soon as you can. If it is almost time for your next dose, take only that dose. Do not take double or extra doses. What may interact with this medicine? Do not take this medicine with any of the following medications:  certain medicines for fungal infections like fluconazole, itraconazole, ketoconazole, posaconazole, voriconazole  cisapride  citalopram  dronedarone  linezolid  MAOIs like Carbex, Eldepryl, Marplan, Nardil, and Parnate  methylene blue (injected into a vein)  pimozide  thioridazine This medicine may also interact with the following medications:  alcohol  amphetamines  aspirin and aspirin-like medicines  carbamazepine  certain medicines for depression, anxiety, or psychotic disturbances  certain medicines for migraine headache like almotriptan, eletriptan, frovatriptan, naratriptan, rizatriptan, sumatriptan, zolmitriptan  certain medicines for sleep  certain medicines that  treat or prevent blood clots like warfarin, enoxaparin, dalteparin  cimetidine  diuretics  dofetilide  fentanyl  furazolidone  isoniazid  lithium  metoprolol  NSAIDs, medicines for pain and inflammation, like ibuprofen or naproxen  other medicines that prolong the QT interval (cause an abnormal heart rhythm)  procarbazine  rasagiline  supplements like St. John's wort, kava kava, valerian  tramadol  tryptophan  ziprasidone This list may not describe all possible interactions. Give your health care provider a list of all the medicines, herbs, non-prescription drugs, or dietary supplements you use. Also tell them if you smoke, drink alcohol, or use illegal drugs. Some items may interact with your medicine. What should I watch for while using this medicine? Tell your doctor if your symptoms do not get better or if they get worse. Visit your doctor or health care professional for regular checks on your progress. Because it may take several weeks to see the full effects  of this medicine, it is important to continue your treatment as prescribed by your doctor. Patients and their families should watch out for new or worsening thoughts of suicide or depression. Also watch out for sudden changes in feelings such as feeling anxious, agitated, panicky, irritable, hostile, aggressive, impulsive, severely restless, overly excited and hyperactive, or not being able to sleep. If this happens, especially at the beginning of treatment or after a change in dose, call your health care professional. Dennis Bast may get drowsy or dizzy. Do not drive, use machinery, or do anything that needs mental alertness until you know how this medicine affects you. Do not stand or sit up quickly, especially if you are an older patient. This reduces the risk of dizzy or fainting spells. Alcohol may interfere with the effect of this medicine. Avoid alcoholic drinks. Your mouth may get dry. Chewing sugarless gum or sucking  hard candy, and drinking plenty of water may help. Contact your doctor if the problem does not go away or is severe. What side effects may I notice from receiving this medicine? Side effects that you should report to your doctor or health care professional as soon as possible:  allergic reactions like skin rash, itching or hives, swelling of the face, lips, or tongue  anxious  black, tarry stools  changes in vision  confusion  elevated mood, decreased need for sleep, racing thoughts, impulsive behavior  eye pain  fast, irregular heartbeat  feeling faint or lightheaded, falls  feeling agitated, angry, or irritable  hallucination, loss of contact with reality  loss of balance or coordination  loss of memory  painful or prolonged erections  restlessness, pacing, inability to keep still  seizures  stiff muscles  suicidal thoughts or other mood changes  trouble sleeping  unusual bleeding or bruising  unusually weak or tired  vomiting Side effects that usually do not require medical attention (report to your doctor or health care professional if they continue or are bothersome):  changes in appetite  change in sex drive or performance  headache  increased sweating  indigestion, nausea  tremors This list may not describe all possible side effects. Call your doctor for medical advice about side effects. You may report side effects to FDA at 1-800-FDA-1088. Where should I keep my medicine? Keep out of reach of children. Store at room temperature between 15 and 30 degrees C (59 and 86 degrees F). Throw away any unused medicine after the expiration date. NOTE: This sheet is a summary. It may not cover all possible information. If you have questions about this medicine, talk to your doctor, pharmacist, or health care provider.  2020 Elsevier/Gold Standard (2017-12-13 11:21:44)

## 2019-02-28 ENCOUNTER — Other Ambulatory Visit: Payer: Self-pay | Admitting: Adult Health

## 2019-02-28 DIAGNOSIS — A5901 Trichomonal vulvovaginitis: Secondary | ICD-10-CM | POA: Insufficient documentation

## 2019-02-28 DIAGNOSIS — R87811 Vaginal high risk human papillomavirus (HPV) DNA test positive: Secondary | ICD-10-CM

## 2019-02-28 LAB — CYTOLOGY - PAP
Chlamydia: NEGATIVE
Comment: NEGATIVE
Comment: NEGATIVE
Comment: NEGATIVE
Comment: NORMAL
Diagnosis: NEGATIVE
High risk HPV: POSITIVE — AB
Neisseria Gonorrhea: NEGATIVE
Trichomonas: POSITIVE — AB

## 2019-02-28 MED ORDER — METRONIDAZOLE 500 MG PO TABS: 500.0000 mg | ORAL_TABLET | Freq: Two times a day (BID) | ORAL | 0 refills | Status: DC

## 2019-02-28 NOTE — Progress Notes (Addendum)
Meds ordered this encounter  Medications  . metroNIDAZOLE (FLAGYL) 500 MG tablet    Sig: Take 1 tablet (500 mg total) by mouth 2 (two) times daily.    Dispense:  14 tablet    Refill:  0  message to [facility] chart and CMA is calling patient with advice as directed.   1. Trichomonas vaginitis   2. 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   Orders Placed This Encounter  Procedures  . Ambulatory referral to Obstetrics / Gynecology    Referral Priority:   Routine    Referral Type:   Consultation    Referral Reason:   Specialty Services Required    Referred to Provider:   Gunnar Bulla, CNM    Requested Specialty:   Obstetrics and Gynecology    Number of Visits Requested:   1

## 2019-02-28 NOTE — Addendum Note (Signed)
Addended by: Berniece Pap on: 02/28/2019 02:35 PM   Modules accepted: Orders

## 2019-02-28 NOTE — Progress Notes (Signed)
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 Trichomonas was present as well. This is likely the cause of her symptoms. Will send Flagyl 500 mg to take by mouth every twelve hours for 7 days. No alcohol with Flagyl or for 48 hours after completing medication. Recommend retesting in 3 months for cure. Recommend treatment for any partners. Resulted negative for  Gonorrhea and Chlamydia. For her positive HPV status with negative malignancy will refer to gynecology for any further work up. Does she have a preference  for gynecology ?

## 2019-03-14 ENCOUNTER — Encounter: Payer: Self-pay | Admitting: Obstetrics and Gynecology

## 2019-03-14 ENCOUNTER — Other Ambulatory Visit: Payer: Self-pay

## 2019-03-14 ENCOUNTER — Ambulatory Visit (INDEPENDENT_AMBULATORY_CARE_PROVIDER_SITE_OTHER): Payer: No Typology Code available for payment source | Admitting: Obstetrics and Gynecology

## 2019-03-14 VITALS — BP 127/82 | HR 57 | Ht 63.25 in | Wt 242.2 lb

## 2019-03-14 DIAGNOSIS — N72 Inflammatory disease of cervix uteri: Secondary | ICD-10-CM | POA: Diagnosis not present

## 2019-03-14 DIAGNOSIS — B977 Papillomavirus as the cause of diseases classified elsewhere: Secondary | ICD-10-CM | POA: Diagnosis not present

## 2019-03-14 NOTE — Progress Notes (Signed)
HPI:      Ms. Bonnie Shepard is a 34 y.o. G3P1011 who LMP was Patient's last menstrual period was 03/06/2019.  Subjective:   She presents today with a recent Pap smear that reveals negative cytology but positive HPV (untyped).  She does report remote history in her teens where she had a previously abnormal Pap smear and was told that she was HPV positive.  She states that her Pap smears since that time have been negative. Of significant note patient does smoke tobacco.  She says she is trying to quit and is down from 1 pack to 4 cigarettes/day. She has also begun playing softball again and says that her weight is slowly declining as well.    Hx: The following portions of the patient's history were reviewed and updated as appropriate:             She  has a past medical history of Acid reflux, Asthma, Infection, and Vaginal Pap smear, abnormal. She does not have any pertinent problems on file. She  has a past surgical history that includes Dilation and evacuation (N/A, 09/07/2013) and Tonsillectomy. Her family history includes Cancer in an other family member; Heart disease in her maternal grandmother; Hypertension in her father, maternal grandmother, mother, and another family member. She  reports that she has been smoking cigarettes. She has a 3.50 pack-year smoking history. She has never used smokeless tobacco. She reports that she does not drink alcohol or use drugs. She has a current medication list which includes the following prescription(s): cyclobenzaprine, escitalopram, nystatin cream, pantoprazole, and metronidazole. She has No Known Allergies.       Review of Systems:  Review of Systems  Constitutional: Denied constitutional symptoms, night sweats, recent illness, fatigue, fever, insomnia and weight loss.  Eyes: Denied eye symptoms, eye pain, photophobia, vision change and visual disturbance.  Ears/Nose/Throat/Neck: Denied ear, nose, throat or neck symptoms, hearing loss, nasal  discharge, sinus congestion and sore throat.  Cardiovascular: Denied cardiovascular symptoms, arrhythmia, chest pain/pressure, edema, exercise intolerance, orthopnea and palpitations.  Respiratory: Denied pulmonary symptoms, asthma, pleuritic pain, productive sputum, cough, dyspnea and wheezing.  Gastrointestinal: Denied, gastro-esophageal reflux, melena, nausea and vomiting.  Genitourinary: Denied genitourinary symptoms including symptomatic vaginal discharge, pelvic relaxation issues, and urinary complaints.  Musculoskeletal: Denied musculoskeletal symptoms, stiffness, swelling, muscle weakness and myalgia.  Dermatologic: Denied dermatology symptoms, rash and scar.  Neurologic: Denied neurology symptoms, dizziness, headache, neck pain and syncope.  Psychiatric: Denied psychiatric symptoms, anxiety and depression.  Endocrine: Denied endocrine symptoms including hot flashes and night sweats.   Meds:   Current Outpatient Medications on File Prior to Visit  Medication Sig Dispense Refill  . cyclobenzaprine (FLEXERIL) 10 MG tablet Take 1 tablet (10 mg total) by mouth at bedtime. 30 tablet 0  . escitalopram (LEXAPRO) 10 MG tablet Take 1 tablet (10 mg total) by mouth daily. 30 tablet 0  . nystatin cream (MYCOSTATIN) Apply 1 application topically 2 (two) times daily. To area of concern. 30 g 0  . pantoprazole (PROTONIX) 20 MG tablet Take 1 tablet (20 mg total) by mouth daily. 30 tablet 1  . metroNIDAZOLE (FLAGYL) 500 MG tablet Take 1 tablet (500 mg total) by mouth 2 (two) times daily. (Patient not taking: Reported on 03/14/2019) 14 tablet 0   No current facility-administered medications on file prior to visit.    Objective:     Vitals:   03/14/19 0731  BP: 127/82  Pulse: (!) 57  Assessment:    G3P1011 Patient Active Problem List   Diagnosis Date Noted  . Trichomonas vaginitis 02/28/2019  . Vaginal high risk HPV DNA test positive 02/28/2019  . Anxiety with depression  02/23/2019  . BMI 40.0-44.9, adult (HCC) 02/23/2019  . Pain of left heel 02/23/2019  . Cervical cancer screening 02/23/2019  . Morbid obesity (HCC) 02/21/2019  . Itching in the vaginal area 02/21/2019  . History of pregnancy loss in prior pregnancy, currently pregnant in second trimester 07/10/2016  . Incontinence overflow, stress female 12/03/2015     1. High risk human papilloma virus (HPV) infection of cervix     With negative cytology   Plan:            1.  Per ASCCP guidelines-recommend follow-up Pap cotesting in 1 year.  HPV I have discussed HPV with the patient in detail.  She is aware that HPV is a sexually transmitted disease and that it is estimated that approximately 2/3 of all sexually active people in this country have this virus.  I reviewed our basic understanding of this virus and its infectivity and transmission.  We have discussed the problems caused by this virus including external condyloma, vaginal and cervical condyloma, cervical dysplasia and its link to cervical cancer.  Treatment of external condyloma, internal condyloma and dysplasia has also been discussed.  The virus and its natural course and history were reviewed in detail.  The necessity for adequate follow-up including pap-smears, viral testing and possible Colposcopy with biopsies has been discussed.  A number of treatment options have also been reviewed.  The use of condoms to help decrease the likelihood of spread of this virus was discussed with the patient.  Literature on HPV virus was provided.  All of the patient's questions were answered.   Orders No orders of the defined types were placed in this encounter.   No orders of the defined types were placed in this encounter.     F/U  Return for Pt to contact us if symptoms worsen. I spent 22 minutes involved in the care of this patient preparing to see the patient by obtaining and reviewing her medical history (including labs, imaging tests and prior  procedures), documenting clinical information in the electronic health record (EHR), counseling and coordinating care plans, writing and sending prescriptions, ordering tests or procedures and directly communicating with the patient by discussing pertinent items from her history and physical exam as well as detailing my assessment and plan as noted above so that she has an informed understanding.  All of her questions were answered.  Elonda Husky, M.D. 03/14/2019 7:59 AM

## 2019-03-23 ENCOUNTER — Other Ambulatory Visit: Payer: Self-pay

## 2019-03-23 ENCOUNTER — Ambulatory Visit (INDEPENDENT_AMBULATORY_CARE_PROVIDER_SITE_OTHER): Payer: No Typology Code available for payment source | Admitting: Adult Health

## 2019-03-23 ENCOUNTER — Encounter: Payer: Self-pay | Admitting: Adult Health

## 2019-03-23 DIAGNOSIS — K645 Perianal venous thrombosis: Secondary | ICD-10-CM

## 2019-03-23 DIAGNOSIS — Z8719 Personal history of other diseases of the digestive system: Secondary | ICD-10-CM | POA: Diagnosis not present

## 2019-03-23 DIAGNOSIS — K6289 Other specified diseases of anus and rectum: Secondary | ICD-10-CM | POA: Diagnosis not present

## 2019-03-23 DIAGNOSIS — Z6841 Body Mass Index (BMI) 40.0 and over, adult: Secondary | ICD-10-CM

## 2019-03-23 LAB — HEMOCCULT GUIAC POC 1CARD (OFFICE): Fecal Occult Blood, POC: NEGATIVE — NL

## 2019-03-23 MED ORDER — ESCITALOPRAM OXALATE 10 MG PO TABS: 10.0000 mg | ORAL_TABLET | Freq: Every day | ORAL | 1 refills | Status: DC

## 2019-03-23 MED ORDER — HYDROCORTISONE ACETATE 25 MG RE SUPP: 25.0000 mg | Freq: Two times a day (BID) | RECTAL | 0 refills | Status: DC

## 2019-03-23 NOTE — Patient Instructions (Addendum)
Hemorrhoids Hemorrhoids are swollen veins in and around the rectum or anus. There are two types of hemorrhoids:  Internal hemorrhoids. These occur in the veins that are just inside the rectum. They may poke through to the outside and become irritated and painful.  External hemorrhoids. These occur in the veins that are outside the anus and can be felt as a painful swelling or hard lump near the anus. Most hemorrhoids do not cause serious problems, and they can be managed with home treatments such as diet and lifestyle changes. If home treatments do not help the symptoms, procedures can be done to shrink or remove the hemorrhoids. What are the causes? This condition is caused by increased pressure in the anal area. This pressure may result from various things, including:  Constipation.  Straining to have a bowel movement.  Diarrhea.  Pregnancy.  Obesity.  Sitting for long periods of time.  Heavy lifting or other activity that causes you to strain.  Anal sex.  Riding a bike for a long period of time. What are the signs or symptoms? Symptoms of this condition include:  Pain.  Anal itching or irritation.  Rectal bleeding.  Leakage of stool (feces).  Anal swelling.  One or more lumps around the anus. How is this diagnosed? This condition can often be diagnosed through a visual exam. Other exams or tests may also be done, such as:  An exam that involves feeling the rectal area with a gloved hand (digital rectal exam).  An exam of the anal canal that is done using a small tube (anoscope).  A blood test, if you have lost a significant amount of blood.  A test to look inside the colon using a flexible tube with a camera on the end (sigmoidoscopy or colonoscopy). How is this treated? This condition can usually be treated at home. However, various procedures may be done if dietary changes, lifestyle changes, and other home treatments do not help your symptoms. These  procedures can help make the hemorrhoids smaller or remove them completely. Some of these procedures involve surgery, and others do not. Common procedures include:  Rubber band ligation. Rubber bands are placed at the base of the hemorrhoids to cut off their blood supply.  Sclerotherapy. Medicine is injected into the hemorrhoids to shrink them.  Infrared coagulation. A type of light energy is used to get rid of the hemorrhoids.  Hemorrhoidectomy surgery. The hemorrhoids are surgically removed, and the veins that supply them are tied off.  Stapled hemorrhoidopexy surgery. The surgeon staples the base of the hemorrhoid to the rectal wall. Follow these instructions at home: Eating and drinking   Eat foods that have a lot of fiber in them, such as whole grains, beans, nuts, fruits, and vegetables.  Ask your health care provider about taking products that have added fiber (fiber supplements).  Reduce the amount of fat in your diet. You can do this by eating low-fat dairy products, eating less red meat, and avoiding processed foods.  Drink enough fluid to keep your urine pale yellow. Managing pain and swelling   Take warm sitz baths for 20 minutes, 3-4 times a day to ease pain and discomfort. You may do this in a bathtub or using a portable sitz bath that fits over the toilet.  If directed, apply ice to the affected area. Using ice packs between sitz baths may be helpful. ? Put ice in a plastic bag. ? Place a towel between your skin and the bag. ? Leave   the ice on for 20 minutes, 2-3 times a day. General instructions  Take over-the-counter and prescription medicines only as told by your health care provider.  Use medicated creams or suppositories as told.  Get regular exercise. Ask your health care provider how much and what kind of exercise is best for you. In general, you should do moderate exercise for at least 30 minutes on most days of the week (150 minutes each week). This can  include activities such as walking, biking, or yoga.  Go to the bathroom when you have the urge to have a bowel movement. Do not wait.  Avoid straining to have bowel movements.  Keep the anal area dry and clean. Use wet toilet paper or moist towelettes after a bowel movement.  Do not sit on the toilet for long periods of time. This increases blood pooling and pain.  Keep all follow-up visits as told by your health care provider. This is important. Contact a health care provider if you have:  Increasing pain and swelling that are not controlled by treatment or medicine.  Difficulty having a bowel movement, or you are unable to have a bowel movement.  Pain or inflammation outside the area of the hemorrhoids. Get help right away if you have:  Uncontrolled bleeding from your rectum. Summary  Hemorrhoids are swollen veins in and around the rectum or anus.  Most hemorrhoids can be managed with home treatments such as diet and lifestyle changes.  Taking warm sitz baths can help ease pain and discomfort.  In severe cases, procedures or surgery can be done to shrink or remove the hemorrhoids. This information is not intended to replace advice given to you by your health care provider. Make sure you discuss any questions you have with your health care provider. Document Revised: 05/20/2018 Document Reviewed: 05/13/2017 Elsevier Patient Education  2020 [employer] Hydrocortisone suppositories What is this medicine? HYDROCORTISONE (hye droe KOR ti sone) is a corticosteroid. It is used to decrease swelling, itching, and pain that is caused by minor skin irritations or by hemorrhoids. This medicine may be used for other purposes; ask your health care provider or pharmacist if you have questions. COMMON BRAND NAME(S): Anucort-HC, Anumed-HC, Anusol HC, Encort, GRx HiCort, Hemmorex-HC, Hemorrhoidal-HC, Hemril, Proctocort, Proctosert HC, Proctosol-HC, Rectacort HC, Rectasol-HC What should I  tell my health care provider before I take this medicine? They need to know if you have any of these conditions:  an unusual or allergic reaction to hydrocortisone, corticosteroids, other medicines, foods, dyes, or preservatives  pregnant or trying to get pregnant  breast-feeding How should I use this medicine? This medicine is for rectal use only. Do not take by mouth. Wash your hands before and after use. Take off the foil wrapping. Wet the tip of the suppository with cold tap water to make it easier to use. Lie on your side with your lower leg straightened out and your upper leg bent forward toward your stomach. Lift upper buttock to expose the rectal area. Apply gentle pressure to insert the suppository completely into the rectum, pointed end first. Hold buttocks together for a few seconds. Remain lying down for about 15 minutes to avoid having the suppository come out. Do not use more often than directed. Talk to your pediatrician regarding the use of this medicine in children. Special care may be needed. Overdosage: If you think you have taken too much of this medicine contact a poison control center or emergency room at once. NOTE: This medicine is  only for you. Do not share this medicine with others. What if I miss a dose? If you miss a dose, use it as soon as you can. If it is almost time for your next dose, use only that dose. Do not use double or extra doses. What may interact with this medicine? Interactions are not expected. Do not use any other rectal products on the affected area without telling your doctor or health care professional. This list may not describe all possible interactions. Give your health care provider a list of all the medicines, herbs, non-prescription drugs, or dietary supplements you use. Also tell them if you smoke, drink alcohol, or use illegal drugs. Some items may interact with your medicine. What should I watch for while using this medicine? Visit your  doctor or health care professional for regular checks on your progress. Tell your doctor or health care professional if your symptoms do not improve after a few days of use. Do not use if there is blood in your stools. If you get any type of infection while using this medicine, you may need to stop using this medicine until our infections clears up. Ask your doctor or health care professional for advice. What side effects may I notice from receiving this medicine? Side effects that you should report to your doctor or health care professional as soon as possible:  bloody or black, tarry stools  painful, red, pus filled blisters in hair follicles  rectal pain, burning or bleeding after use of medicine Side effects that usually do not require medical attention (report to your doctor or health care professional if they continue or are bothersome):  changes in skin color  dry skin  itching or irritation This list may not describe all possible side effects. Call your doctor for medical advice about side effects. You may report side effects to FDA at 1-800-FDA-1088. Where should I keep my medicine? Keep out of the reach of children. Store at room temperature between 20 and 25 degrees C (68 and 77 degrees F). Protect from heat and freezing. Throw away any unused medicine after the expiration date. NOTE: This sheet is a summary. It may not cover all possible information. If you have questions about this medicine, talk to your doctor, pharmacist, or health care provider.  2020 Elsevier/Gold Standard (2007-05-06 16:07:24)  Generalized Anxiety Disorder, Adult Generalized anxiety disorder (GAD) is a mental health disorder. People with this condition constantly worry about everyday events. Unlike normal anxiety, worry related to GAD is not triggered by a specific event. These worries also do not fade or get better with time. GAD interferes with life functions, including relationships, work, and  school. GAD can vary from mild to severe. People with severe GAD can have intense waves of anxiety with physical symptoms (panic attacks). What are the causes? The exact cause of GAD is not known. What increases the risk? This condition is more likely to develop in:  Women.  People who have a family history of anxiety disorders.  People who are very shy.  People who experience very stressful life events, such as the death of a loved one.  People who have a very stressful family environment. What are the signs or symptoms? People with GAD often worry excessively about many things in their lives, such as their health and family. They may also be overly concerned about:  Doing well at work.  Being on time.  Natural disasters.  Friendships. Physical symptoms of GAD include:  Fatigue.  Muscle tension or having muscle twitches.  Trembling or feeling shaky.  Being easily startled.  Feeling like your heart is pounding or racing.  Feeling out of breath or like you cannot take a deep breath.  Having trouble falling asleep or staying asleep.  Sweating.  Nausea, diarrhea, or irritable bowel syndrome (IBS).  Headaches.  Trouble concentrating or remembering facts.  Restlessness.  Irritability. How is this diagnosed? Your health care provider can diagnose GAD based on your symptoms and medical history. You will also have a physical exam. The health care provider will ask specific questions about your symptoms, including how severe they are, when they started, and if they come and go. Your health care provider may ask you about your use of alcohol or drugs, including prescription medicines. Your health care provider may refer you to a mental health specialist for further evaluation. Your health care provider will do a thorough examination and may perform additional tests to rule out other possible causes of your symptoms. To be diagnosed with GAD, a person must have anxiety  that:  Is out of his or her control.  Affects several different aspects of his or her life, such as work and relationships.  Causes distress that makes him or her unable to take part in normal activities.  Includes at least three physical symptoms of GAD, such as restlessness, fatigue, trouble concentrating, irritability, muscle tension, or sleep problems. Before your health care provider can confirm a diagnosis of GAD, these symptoms must be present more days than they are not, and they must last for six months or longer. How is this treated? The following therapies are usually used to treat GAD:  Medicine. Antidepressant medicine is usually prescribed for long-term daily control. Antianxiety medicines may be added in severe cases, especially when panic attacks occur.  Talk therapy (psychotherapy). Certain types of talk therapy can be helpful in treating GAD by providing support, education, and guidance. Options include: ? Cognitive behavioral therapy (CBT). People learn coping skills and techniques to ease their anxiety. They learn to identify unrealistic or negative thoughts and behaviors and to replace them with positive ones. ? Acceptance and commitment therapy (ACT). This treatment teaches people how to be mindful as a way to cope with unwanted thoughts and feelings. ? Biofeedback. This process trains you to manage your body's response (physiological response) through breathing techniques and relaxation methods. You will work with a therapist while machines are used to monitor your physical symptoms.  Stress management techniques. These include yoga, meditation, and exercise. A mental health specialist can help determine which treatment is best for you. Some people see improvement with one type of therapy. However, other people require a combination of therapies. Follow these instructions at home:  Take over-the-counter and prescription medicines only as told by your health care  provider.  Try to maintain a normal routine.  Try to anticipate stressful situations and allow extra time to manage them.  Practice any stress management or self-calming techniques as taught by your health care provider.  Do not punish yourself for setbacks or for not making progress.  Try to recognize your accomplishments, even if they are small.  Keep all follow-up visits as told by your health care provider. This is important. Contact a health care provider if:  Your symptoms do not get better.  Your symptoms get worse.  You have signs of depression, such as: ? A persistently sad, cranky, or irritable mood. ? Loss of enjoyment in activities that used to  bring you joy. ? Change in weight or eating. ? Changes in sleeping habits. ? Avoiding friends or family members. ? Loss of energy for normal tasks. ? Feelings of guilt or worthlessness. Get help right away if:  You have serious thoughts about hurting yourself or others. If you ever feel like you may hurt yourself or others, or have thoughts about taking your own life, get help right away. You can go to your nearest emergency department or call:  Your local emergency services (911 in the [place]  A suicide crisis helpline, such as the National Suicide Prevention Lifeline at 780-303-5178. This is open 24 hours a day. Summary  Generalized anxiety disorder (GAD) is a mental health disorder that involves worry that is not triggered by a specific event.  People with GAD often worry excessively about many things in their lives, such as their health and family.  GAD may cause physical symptoms such as restlessness, trouble concentrating, sleep problems, frequent sweating, nausea, diarrhea, headaches, and trembling or muscle twitching.  A mental health specialist can help determine which treatment is best for you. Some people see improvement with one type of therapy. However, other people require a combination of therapies. This  information is not intended to replace advice given to you by your health care provider. Make sure you discuss any questions you have with your health care provider. Document Revised: 12/04/2016 Document Reviewed: 11/12/2015 Elsevier Patient Education  2020 [employer] Inc. Escitalopram tablets What is this medicine? ESCITALOPRAM (es sye TAL oh pram) is used to treat depression and certain types of anxiety. This medicine may be used for other purposes; ask your health care provider or pharmacist if you have questions. COMMON BRAND NAME(S): Lexapro What should I tell my health care provider before I take this medicine? They need to know if you have any of these conditions:  bipolar disorder or a family history of bipolar disorder  diabetes  glaucoma  heart disease  kidney or liver disease  receiving electroconvulsive therapy  seizures (convulsions)  suicidal thoughts, plans, or attempt by you or a family member  an unusual or allergic reaction to escitalopram, the related drug citalopram, other medicines, foods, dyes, or preservatives  pregnant or trying to become pregnant  breast-feeding How should I use this medicine? Take this medicine by mouth with a glass of water. Follow the directions on the prescription label. You can take it with or without food. If it upsets your stomach, take it with food. Take your medicine at regular intervals. Do not take it more often than directed. Do not stop taking this medicine suddenly except upon the advice of your doctor. Stopping this medicine too quickly may cause serious side effects or your condition may worsen. A special MedGuide will be given to you by the pharmacist with each prescription and refill. Be sure to read this information carefully each time. Talk to your pediatrician regarding the use of this medicine in children. Special care may be needed. Overdosage: If you think you have taken too much of this medicine contact a poison  control center or emergency room at once. NOTE: This medicine is only for you. Do not share this medicine with others. What if I miss a dose? If you miss a dose, take it as soon as you can. If it is almost time for your next dose, take only that dose. Do not take double or extra doses. What may interact with this medicine? Do not take this medicine with any of  the following medications:  certain medicines for fungal infections like fluconazole, itraconazole, ketoconazole, posaconazole, voriconazole  cisapride  citalopram  dronedarone  linezolid  MAOIs like Carbex, Eldepryl, Marplan, Nardil, and Parnate  methylene blue (injected into a vein)  pimozide  thioridazine This medicine may also interact with the following medications:  alcohol  amphetamines  aspirin and aspirin-like medicines  carbamazepine  certain medicines for depression, anxiety, or psychotic disturbances  certain medicines for migraine headache like almotriptan, eletriptan, frovatriptan, naratriptan, rizatriptan, sumatriptan, zolmitriptan  certain medicines for sleep  certain medicines that treat or prevent blood clots like warfarin, enoxaparin, dalteparin  cimetidine  diuretics  dofetilide  fentanyl  furazolidone  isoniazid  lithium  metoprolol  NSAIDs, medicines for pain and inflammation, like ibuprofen or naproxen  other medicines that prolong the QT interval (cause an abnormal heart rhythm)  procarbazine  rasagiline  supplements like St. John's wort, kava kava, valerian  tramadol  tryptophan  ziprasidone This list may not describe all possible interactions. Give your health care provider a list of all the medicines, herbs, non-prescription drugs, or dietary supplements you use. Also tell them if you smoke, drink alcohol, or use illegal drugs. Some items may interact with your medicine. What should I watch for while using this medicine? Tell your doctor if your symptoms do  not get better or if they get worse. Visit your doctor or health care professional for regular checks on your progress. Because it may take several weeks to see the full effects of this medicine, it is important to continue your treatment as prescribed by your doctor. Patients and their families should watch out for new or worsening thoughts of suicide or depression. Also watch out for sudden changes in feelings such as feeling anxious, agitated, panicky, irritable, hostile, aggressive, impulsive, severely restless, overly excited and hyperactive, or not being able to sleep. If this happens, especially at the beginning of treatment or after a change in dose, call your health care professional. Bonita Quin may get drowsy or dizzy. Do not drive, use machinery, or do anything that needs mental alertness until you know how this medicine affects you. Do not stand or sit up quickly, especially if you are an older patient. This reduces the risk of dizzy or fainting spells. Alcohol may interfere with the effect of this medicine. Avoid alcoholic drinks. Your mouth may get dry. Chewing sugarless gum or sucking hard candy, and drinking plenty of water may help. Contact your doctor if the problem does not go away or is severe. What side effects may I notice from receiving this medicine? Side effects that you should report to your doctor or health care professional as soon as possible:  allergic reactions like skin rash, itching or hives, swelling of the face, lips, or tongue  anxious  black, tarry stools  changes in vision  confusion  elevated mood, decreased need for sleep, racing thoughts, impulsive behavior  eye pain  fast, irregular heartbeat  feeling faint or lightheaded, falls  feeling agitated, angry, or irritable  hallucination, loss of contact with reality  loss of balance or coordination  loss of memory  painful or prolonged erections  restlessness, pacing, inability to keep  still  seizures  stiff muscles  suicidal thoughts or other mood changes  trouble sleeping  unusual bleeding or bruising  unusually weak or tired  vomiting Side effects that usually do not require medical attention (report to your doctor or health care professional if they continue or are bothersome):  changes in  appetite  change in sex drive or performance  headache  increased sweating  indigestion, nausea  tremors This list may not describe all possible side effects. Call your doctor for medical advice about side effects. You may report side effects to FDA at 1-800-FDA-1088. Where should I keep my medicine? Keep out of reach of children. Store at room temperature between 15 and 30 degrees C (59 and 86 degrees F). Throw away any unused medicine after the expiration date. NOTE: This sheet is a summary. It may not cover all possible information. If you have questions about this medicine, talk to your doctor, pharmacist, or health care provider.  2020 Elsevier/Gold Standard (2017-12-13 11:21:44)   Fat and Cholesterol Restricted Eating Plan Getting too much fat and cholesterol in your diet may cause health problems. Choosing the right foods helps keep your fat and cholesterol at normal levels. This can keep you from getting certain diseases. Your doctor may recommend an eating plan that includes:  Total fat: ______% or less of total calories a day.  Saturated fat: ______% or less of total calories a day.  Cholesterol: less than _________mg a day.  Fiber: ______g a day. What are tips for following this plan? Meal planning  At meals, divide your plate into four equal parts: ? Fill one-half of your plate with vegetables and green salads. ? Fill one-fourth of your plate with whole grains. ? Fill one-fourth of your plate with low-fat (lean) protein foods.  Eat fish that is high in omega-3 fats at least two times a week. This includes mackerel, tuna, sardines, and  salmon.  Eat foods that are high in fiber, such as whole grains, beans, apples, broccoli, carrots, peas, and barley. General tips   Work with your doctor to lose weight if you need to.  Avoid: ? Foods with added sugar. ? Fried foods. ? Foods with partially hydrogenated oils.  Limit alcohol intake to no more than 1 drink a day for nonpregnant women and 2 drinks a day for men. One drink equals 12 oz of beer, 5 oz of wine, or 1 oz of hard liquor. Reading food labels  Check food labels for: ? Trans fats. ? Partially hydrogenated oils. ? Saturated fat (g) in each serving. ? Cholesterol (mg) in each serving. ? Fiber (g) in each serving.  Choose foods with healthy fats, such as: ? Monounsaturated fats. ? Polyunsaturated fats. ? Omega-3 fats.  Choose grain products that have whole grains. Look for the word "whole" as the first word in the ingredient list. Cooking  Cook foods using low-fat methods. These include baking, boiling, grilling, and broiling.  Eat more home-cooked foods. Eat at restaurants and buffets less often.  Avoid cooking using saturated fats, such as butter, cream, palm oil, palm kernel oil, and coconut oil. Recommended foods  Fruits  All fresh, canned (in natural juice), or frozen fruits. Vegetables  Fresh or frozen vegetables (raw, steamed, roasted, or grilled). Green salads. Grains  Whole grains, such as whole wheat or whole grain breads, crackers, cereals, and pasta. Unsweetened oatmeal, bulgur, barley, quinoa, or brown rice. Corn or whole wheat flour tortillas. Meats and other protein foods  Ground beef (85% or leaner), grass-fed beef, or beef trimmed of fat. Skinless chicken or [place]. Ground chicken or [place]. Pork trimmed of fat. All fish and seafood. Egg whites. Dried beans, peas, or lentils. Unsalted nuts or seeds. Unsalted canned beans. Nut butters without added sugar or oil. Dairy  Low-fat or nonfat dairy products, such as  skim or 1% milk, 2%  or reduced-fat cheeses, low-fat and fat-free ricotta or cottage cheese, or plain low-fat and nonfat yogurt. Fats and oils  Tub margarine without trans fats. Light or reduced-fat mayonnaise and salad dressings. Avocado. Olive, canola, sesame, or safflower oils. The items listed above may not be a complete list of foods and beverages you can eat. Contact a dietitian for more information. Foods to avoid Fruits  Canned fruit in heavy syrup. Fruit in cream or butter sauce. Fried fruit. Vegetables  Vegetables cooked in cheese, cream, or butter sauce. Fried vegetables. Grains  [place]il bread. Pedone pasta. Rehman rice. Cornbread. Bagels, pastries, and croissants. Crackers and snack foods that contain trans fat and hydrogenated oils. Meats and other protein foods  Fatty cuts of meat. Ribs, chicken wings, bacon, sausage, bologna, salami, chitterlings, fatback, hot dogs, bratwurst, and packaged lunch meats. Liver and organ meats. Whole eggs and egg yolks. Chicken and [place] with skin. Fried meat. Dairy  Whole or 2% milk, cream, half-and-half, and cream cheese. Whole milk cheeses. Whole-fat or sweetened yogurt. Full-fat cheeses. Nondairy creamers and whipped toppings. Processed cheese, cheese spreads, and cheese curds. Beverages  Alcohol. Sugar-sweetened drinks such as sodas, lemonade, and fruit drinks. Fats and oils  Butter, stick margarine, lard, shortening, ghee, or bacon fat. Coconut, palm kernel, and palm oils. Sweets and desserts  Corn syrup, sugars, honey, and molasses. Candy. Jam and jelly. Syrup. Sweetened cereals. Cookies, pies, cakes, donuts, muffins, and ice cream. The items listed above may not be a complete list of foods and beverages you should avoid. Contact a dietitian for more information. Summary  Choosing the right foods helps keep your fat and cholesterol at normal levels. This can keep you from getting certain diseases.  At meals, fill one-half of your plate with vegetables  and green salads.  Eat high-fiber foods, like whole grains, beans, apples, carrots, peas, and barley.  Limit added sugar, saturated fats, alcohol, and fried foods. This information is not intended to replace advice given to you by your health care provider. Make sure you discuss any questions you have with your health care provider. Document Revised: 08/25/2017 Document Reviewed: 09/08/2016 Elsevier Patient Education  [place].

## 2019-03-23 NOTE — Progress Notes (Signed)
Patient: Bonnie Shepard Female    DOB: [birth year]   34 y.o.   MRN: [id number] Visit Date: 03/23/2019  Today's Provider: Marcille Buffy, FNP   Chief Complaint  Patient presents with  . Anxiety  . Depression   Subjective:     HPI  Depression/Anxiety, Follow-up  She  was last seen for this 1 months ago. Changes made at last visit include starting patient on Lexapro 10mg  .   She reports good compliance with treatment. She is not having side effects. Patient reports that she has noticed that she has been waking up in the morning with a headache and is unsure if related to medication, patient states that she takes medicine in  PM.   She reports excellent tolerance of treatment. Current symptoms include: fatigue She feels she is Unchanged since last visit.  Denies any suicidal or homicidal ideations or intents.  She feels better with her thoughts and is having less worry. She would like to remain on the same dose. She is aware of black box warning and side effects.   Trichomonas - treated with Flagyl reports no itching or discharge now. declines repeat testing.   Denies any new bleeding rectally from initially reported at first visit. Today she reports she has had rectal pain pain x 2 days with BM. Feels like burning sensation.   Denies any dark stools.  Denies any abdominal pain or difficulty or passing  BM.  She saw Dr. 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  Denies any new symptoms.   Patient  denies any fever, body aches,chills, rash, chest pain, shortness of breath, nausea, vomiting, or diarrhea.   ------------------------------------------------------------------------   No Known Allergies   Current Outpatient Medications:  .  cyclobenzaprine (FLEXERIL) 10 MG tablet, Take 1 tablet (10 mg total) by mouth at bedtime., Disp: 30 tablet, Rfl: 0 .  pantoprazole (PROTONIX) 20 MG tablet, Take 1 tablet (20 mg total) by mouth  daily., Disp: 30 tablet, Rfl: 1 .  escitalopram (LEXAPRO) 10 MG tablet, Take 1 tablet (10 mg total) by mouth daily., Disp: 30 tablet, Rfl: 0 .  nystatin cream (MYCOSTATIN), Apply 1 application topically 2 (two) times daily. To area of concern. (Patient not taking: Reported on 03/23/2019), Disp: 30 g, Rfl: 0  Review of Systems  Constitutional: Negative.   HENT: Negative.   Respiratory: Negative.   Cardiovascular: Negative.   Gastrointestinal: Positive for anal bleeding (none recent - history in past. sees Dr. Allen Norris GI friday ) and rectal pain.  Musculoskeletal: Negative.   Neurological: Negative.   Psychiatric/Behavioral: Negative for agitation, behavioral problems, confusion, decreased concentration, dysphoric mood, hallucinations, self-injury, sleep disturbance and suicidal ideas. The patient is nervous/anxious (improved ). The patient is not hyperactive.     Social History   Tobacco Use  . Smoking status: Current Some Day Smoker    Packs/day: 0.50    Years: 7.00    Pack years: 3.50    Types: Cigarettes  . Smokeless tobacco: Never Used  Substance Use Topics  . Alcohol use: No    Comment: occasionally      Objective:   BP 112/78   Pulse 73   Temp 98.2 F (36.8 C) (Oral)   Resp 16   Wt 240 lb 9.6 oz (109.1 kg)   LMP 03/06/2019   SpO2 96%   BMI 42.28 kg/m  Vitals:   03/23/19 1535  BP: 112/78  Pulse: 73  Resp:  16  Temp: 98.2 F (36.8 C)  TempSrc: Oral  SpO2: 96%  Weight: 240 lb 9.6 oz (109.1 kg)  Body mass index is 42.28 kg/m.   Physical Exam Constitutional:      General: She is not in acute distress.    Appearance: Normal appearance. She is obese. She is not ill-appearing, toxic-appearing or diaphoretic.  HENT:     Head: Normocephalic and atraumatic.     Right Ear: External ear normal.     Left Ear: External ear normal.     Mouth/Throat:     Mouth: Mucous membranes are moist.  Eyes:     Pupils: Pupils are equal, round, and reactive to light.    Cardiovascular:     Rate and Rhythm: Normal rate and regular rhythm.     Pulses: Normal pulses.     Heart sounds: Normal heart sounds. No murmur. No friction rub. No gallop.   Pulmonary:     Effort: Pulmonary effort is normal. No respiratory distress.     Breath sounds: Normal breath sounds. No stridor. No wheezing, rhonchi or rales.  Chest:     Chest wall: No tenderness.  Abdominal:     General: There is no distension.     Palpations: Abdomen is soft. There is no mass.     Tenderness: There is no abdominal tenderness. There is no guarding or rebound.     Hernia: No hernia is present.  Genitourinary:    Rectum: Guaiac result negative. Tenderness, external hemorrhoid (thrombosed in apperance ) and internal hemorrhoid (small internal at 4pm palpated ) present. Normal anal tone.     Comments: Negative guaiac.  Musculoskeletal:        General: Normal range of motion.     Cervical back: Neck supple. No rigidity.  Skin:    General: Skin is warm and dry.     Capillary Refill: Capillary refill takes less than 2 seconds.  Neurological:     Mental Status: She is alert and oriented to person, place, and time.  Psychiatric:        Mood and Affect: Mood normal.        Behavior: Behavior normal.        Thought Content: Thought content normal.        Judgment: Judgment normal.      No results found for any visits on 03/23/19.     Assessment & Plan    Morbid obesity (HCC), Chronic  Hemorrhoids, external, thrombosed  Rectal or anal pain - Plan: POCT Occult Blood Stool  History of rectal bleeding - Plan: POCT Occult Blood Stool  Body mass index (BMI) of 40.1-44.9 in adult [facility])  keep appointment with gastroenterology 03/24/2019.  She is doing well on Lexapro, prefers to stay at continued dosage will call office if any symptoms changing or worsening. Black box warning reviewed again.  Meds ordered this encounter  Medications  . escitalopram (LEXAPRO) 10 MG tablet    Sig: Take 1  tablet (10 mg total) by mouth daily.    Dispense:  90 tablet    Refill:  1  . hydrocortisone (ANUSOL-HC) 25 MG suppository    Sig: Place 1 suppository (25 mg total) rectally 2 (two) times daily.    Dispense:  12 suppository    Refill:  0   Labs cholesterol, CMP- glucose, and Vitamin D at follow up.   Advised patient call the office or your primary care doctor for an appointment if no improvement within 72 hours or  if any symptoms change or worsen at any time  Advised ER or urgent Care if after hours or on weekend. Call 911 for emergency symptoms at any time.Patinet verbalized understanding of all instructions given/reviewed and treatment plan and has no further questions or concerns at this time.    Return in about 6 months (around 09/23/2019), or if symptoms worsen or fail to improve, for at any time for any worsening symptoms, Go to Emergency room/ urgent care if worse.  The entirety of the information documented in the History of Present Illness, Review of Systems and Physical Exam were personally obtained by me. Portions of this information were initially documented by the  Certified Medical Assistant whose name is documented in Epic and reviewed by me for thoroughness and accuracy.  I have personally performed the exam and reviewed the chart and it is accurate to the best of my knowledge.  Museum/gallery conservator has been used and any errors in dictation or transcription are unintentional.  Eula Fried. Flinchum FNP-C  [facility]     Jairo Ben, FNP  [facility]

## 2019-03-28 ENCOUNTER — Other Ambulatory Visit: Payer: Self-pay

## 2019-03-28 ENCOUNTER — Ambulatory Visit: Payer: No Typology Code available for payment source | Admitting: Gastroenterology

## 2019-03-28 ENCOUNTER — Encounter: Payer: Self-pay | Admitting: Gastroenterology

## 2019-03-28 VITALS — BP 132/74 | HR 57 | Temp 97.8°F | Ht 63.0 in | Wt 240.0 lb

## 2019-03-28 DIAGNOSIS — K921 Melena: Secondary | ICD-10-CM | POA: Diagnosis not present

## 2019-03-28 NOTE — Progress Notes (Signed)
  Gastroenterology Consultation  Referring Provider:     Flinchum, Michelle S, F* Primary Care Physician:  Flinchum, Michelle S, FNP Primary Gastroenterologist:  Dr. Christpher Stogsdill     Reason for Consultation:     Rectal bleeding        HPI:   Bonnie Shepard is a 33 y.o. y/o female referred for consultation & management of rectal bleeding by Dr. Flinchum, Michelle S, FNP.  This patient comes in today with a history of rectal bleeding.  The patient comes with pictures of her stool on her phone which showed her to have blood mixed with the stools.  She denies any unexplained weight loss fevers chills nausea or vomiting.  The patient reports that she was concerned because her mother side of the family have had relatives with colon cancer including 2 aunts of hers.  She is not sure if her mother has had any colon issues but denies any other family members.  The patient also denies any abdominal pain or change in bowel habits.  There is no report of any abdominal pain associated with her rectal bleeding but she does report some rectal pain intermittently.  Past Medical History:  Diagnosis Date  . Acid reflux   . Asthma   . Infection    freq UTI  . Vaginal Pap smear, abnormal    f/u wnl    Past Surgical History:  Procedure Laterality Date  . DILATION AND EVACUATION N/A 09/07/2013   Procedure: DILATATION AND EVACUATION;  Surgeon: Kendra H. Ross, MD;  Location: [facility];  Service: Gynecology;  Laterality: N/A;  . TONSILLECTOMY      Prior to Admission medications   Medication Sig Start Date End Date Taking? Authorizing Provider  cyclobenzaprine (FLEXERIL) 10 MG tablet Take 1 tablet (10 mg total) by mouth at bedtime. 01/19/19  Yes Flinchum, Michelle S, FNP  escitalopram (LEXAPRO) 10 MG tablet Take 1 tablet (10 mg total) by mouth daily. 03/23/19  Yes Flinchum, Michelle S, FNP  pantoprazole (PROTONIX) 20 MG tablet Take 1 tablet (20 mg total) by mouth daily. 01/19/19  Yes Flinchum, Michelle S, FNP  hydrocortisone  (ANUSOL-HC) 25 MG suppository Place 1 suppository (25 mg total) rectally 2 (two) times daily. Patient not taking: Reported on 03/28/2019 03/23/19   Flinchum, Michelle S, FNP    Family History  Problem Relation Age of Onset  . Hypertension Other   . Cancer Other   . Hypertension Mother   . Hypertension Father   . Hypertension Maternal Grandmother   . Heart disease Maternal Grandmother   . Hearing loss Neg Hx      Social History   Tobacco Use  . Smoking status: Current Some Day Smoker    Packs/day: 0.50    Years: 7.00    Pack years: 3.50    Types: Cigarettes  . Smokeless tobacco: Never Used  Substance Use Topics  . Alcohol use: No    Comment: occasionally  . Drug use: No    Allergies as of 03/28/2019  . (No Known Allergies)    Review of Systems:    All systems reviewed and negative except where noted in HPI.   Physical Exam:  BP 132/74   Pulse (!) 57   Temp 97.8 F (36.6 C) (Oral)   Ht 5' 3" (1.6 m)   Wt 240 lb (108.9 kg)   LMP 03/06/2019   BMI 42.51 kg/m  Patient's last menstrual period was 03/06/2019. General:   Alert,  Well-developed, well-nourished, pleasant and cooperative   in NAD Head:  Normocephalic and atraumatic. Eyes:  Sclera clear, no icterus.   Conjunctiva pink. Ears:  Normal auditory acuity. Neck:  Supple; no masses or thyromegaly. Lungs:  Respirations even and unlabored.  Clear throughout to auscultation.   No wheezes, crackles, or rhonchi. No acute distress. Heart:  Regular rate and rhythm; no murmurs, clicks, rubs, or gallops. Abdomen:  Normal bowel sounds.  No bruits.  Soft, non-tender and non-distended without masses, hepatosplenomegaly or hernias noted.  No guarding or rebound tenderness.  Negative Carnett sign.   Rectal:  Deferred.  Pulses:  Normal pulses noted. Extremities:  No clubbing or edema.  No cyanosis. Neurologic:  Alert and oriented x3;  grossly normal neurologically. Skin:  Intact without significant lesions or rashes.  No  jaundice. Lymph Nodes:  No significant cervical adenopathy. Psych:  Alert and cooperative. Normal mood and affect.  Imaging Studies: No results found.  Assessment and Plan:   Bonnie Shepard is a 34 y.o. y/o female who comes in today with a history of rectal bleeding with blood mixed with the stool and concerned because multiple family members have had GI malignancies.  The patient will be set up for a colonoscopy to rule out any pathology such as colitis, neoplasms, hemorrhoids or anal fissures as the cause of her symptoms. I have discussed risks & benefits which include, but are not limited to, bleeding, infection, perforation & drug reaction.  The patient agrees with this plan & written consent will be obtained.       Midge Minium, MD. Clementeen Graham    Note: This dictation was prepared with Dragon dictation along with smaller phrase technology. Any transcriptional errors that result from this process are unintentional.

## 2019-03-28 NOTE — H&P (View-Only) (Signed)
Gastroenterology Consultation  Referring Provider:     Sharmon Leyden* Primary Care Physician:  Doreen Beam, FNP Primary Gastroenterologist:  Dr. Allen Norris     Reason for Consultation:     Rectal bleeding        HPI:   Bonnie Shepard is a 34 y.o. y/o female referred for consultation & management of rectal bleeding by Dr. Claudina Lick, Kelby Aline, FNP.  This patient comes in today with a history of rectal bleeding.  The patient comes with pictures of her stool on her phone which showed her to have blood mixed with the stools.  She denies any unexplained weight loss fevers chills nausea or vomiting.  The patient reports that she was concerned because her mother side of the family have had relatives with colon cancer including 2 aunts of hers.  She is not sure if her mother has had any colon issues but denies any other family members.  The patient also denies any abdominal pain or change in bowel habits.  There is no report of any abdominal pain associated with her rectal bleeding but she does report some rectal pain intermittently.  Past Medical History:  Diagnosis Date  . Acid reflux   . Asthma   . Infection    freq UTI  . Vaginal Pap smear, abnormal    f/u wnl    Past Surgical History:  Procedure Laterality Date  . DILATION AND EVACUATION N/A 09/07/2013   Procedure: DILATATION AND EVACUATION;  Surgeon: Farrel Gobble. Harrington Challenger, MD;  Location: [place] [facility];  Service: Gynecology;  Laterality: N/A;  . TONSILLECTOMY      Prior to Admission medications   Medication Sig Start Date End Date Taking? Authorizing Provider  cyclobenzaprine (FLEXERIL) 10 MG tablet Take 1 tablet (10 mg total) by mouth at bedtime. 01/19/19  Yes Flinchum, Kelby Aline, FNP  escitalopram (LEXAPRO) 10 MG tablet Take 1 tablet (10 mg total) by mouth daily. 03/23/19  Yes Flinchum, Kelby Aline, FNP  pantoprazole (PROTONIX) 20 MG tablet Take 1 tablet (20 mg total) by mouth daily. 01/19/19  Yes Flinchum, Kelby Aline, FNP  hydrocortisone  (ANUSOL-HC) 25 MG suppository Place 1 suppository (25 mg total) rectally 2 (two) times daily. Patient not taking: Reported on 03/28/2019 03/23/19   Flinchum, Kelby Aline, FNP    Family History  Problem Relation Age of Onset  . Hypertension Other   . Cancer Other   . Hypertension Mother   . Hypertension Father   . Hypertension Maternal Grandmother   . Heart disease Maternal Grandmother   . Hearing loss Neg Hx      Social History   Tobacco Use  . Smoking status: Current Some Day Smoker    Packs/day: 0.50    Years: 7.00    Pack years: 3.50    Types: Cigarettes  . Smokeless tobacco: Never Used  Substance Use Topics  . Alcohol use: No    Comment: occasionally  . Drug use: No    Allergies as of 03/28/2019  . (No Known Allergies)    Review of Systems:    All systems reviewed and negative except where noted in HPI.   Physical Exam:  BP 132/74   Pulse (!) 57   Temp 97.8 F (36.6 C) (Oral)   Ht 5\' 3"  (1.6 m)   Wt 240 lb (108.9 kg)   LMP 03/06/2019   BMI 42.51 kg/m  Patient's last menstrual period was 03/06/2019. General:   Alert,  Well-developed, well-nourished, pleasant and cooperative  in NAD Head:  Normocephalic and atraumatic. Eyes:  Sclera clear, no icterus.   Conjunctiva pink. Ears:  Normal auditory acuity. Neck:  Supple; no masses or thyromegaly. Lungs:  Respirations even and unlabored.  Clear throughout to auscultation.   No wheezes, crackles, or rhonchi. No acute distress. Heart:  Regular rate and rhythm; no murmurs, clicks, rubs, or gallops. Abdomen:  Normal bowel sounds.  No bruits.  Soft, non-tender and non-distended without masses, hepatosplenomegaly or hernias noted.  No guarding or rebound tenderness.  Negative Carnett sign.   Rectal:  Deferred.  Pulses:  Normal pulses noted. Extremities:  No clubbing or edema.  No cyanosis. Neurologic:  Alert and oriented x3;  grossly normal neurologically. Skin:  Intact without significant lesions or rashes.  No  jaundice. Lymph Nodes:  No significant cervical adenopathy. Psych:  Alert and cooperative. Normal mood and affect.  Imaging Studies: No results found.  Assessment and Plan:   Bonnie Shepard is a 34 y.o. y/o female who comes in today with a history of rectal bleeding with blood mixed with the stool and concerned because multiple family members have had GI malignancies.  The patient will be set up for a colonoscopy to rule out any pathology such as colitis, neoplasms, hemorrhoids or anal fissures as the cause of her symptoms. I have discussed risks & benefits which include, but are not limited to, bleeding, infection, perforation & drug reaction.  The patient agrees with this plan & written consent will be obtained.       Midge Minium, MD. Clementeen Graham    Note: This dictation was prepared with Dragon dictation along with smaller phrase technology. Any transcriptional errors that result from this process are unintentional.

## 2019-03-30 ENCOUNTER — Other Ambulatory Visit: Payer: Self-pay

## 2019-03-30 ENCOUNTER — Other Ambulatory Visit
Admission: RE | Admit: 2019-03-30 | Discharge: 2019-03-30 | Disposition: A | Discharge status: Home / Self Care | Payer: No Typology Code available for payment source | Source: Ambulatory Visit | Attending: Gastroenterology | Admitting: Gastroenterology

## 2019-03-30 ENCOUNTER — Encounter: Payer: Self-pay | Admitting: Gastroenterology

## 2019-03-30 DIAGNOSIS — Z01812 Encounter for preprocedural laboratory examination: Secondary | ICD-10-CM | POA: Insufficient documentation

## 2019-03-30 DIAGNOSIS — Z20822 Contact with and (suspected) exposure to covid-19: Secondary | ICD-10-CM | POA: Diagnosis not present

## 2019-03-30 DIAGNOSIS — K921 Melena: Secondary | ICD-10-CM

## 2019-03-30 LAB — SARS CORONAVIRUS 2 (TAT 6-24 HRS): SARS Coronavirus 2: NEGATIVE

## 2019-03-31 NOTE — Discharge Instructions (Signed)
General Anesthesia, Adult, Care After This sheet gives you information about how to care for yourself after your procedure. Your health care provider may also give you more specific instructions. If you have problems or questions, contact your health care provider. What can I expect after the procedure? After the procedure, the following side effects are common:  Pain or discomfort at the IV site.  Nausea.  Vomiting.  Sore throat.  Trouble concentrating.  Feeling cold or chills.  Weak or tired.  Sleepiness and fatigue.  Soreness and body aches. These side effects can affect parts of the body that were not involved in surgery. Follow these instructions at home:  For at least 24 hours after the procedure:  Have a responsible adult stay with you. It is important to have someone help care for you until you are awake and alert.  Rest as needed.  Do not: ? Participate in activities in which you could fall or become injured. ? Drive. ? Use heavy machinery. ? Drink alcohol. ? Take sleeping pills or medicines that cause drowsiness. ? Make important decisions or sign legal documents. ? Take care of children on your own. Eating and drinking  Follow any instructions from your health care provider about eating or drinking restrictions.  When you feel hungry, start by eating small amounts of foods that are soft and easy to digest (bland), such as toast. Gradually return to your regular diet.  Drink enough fluid to keep your urine pale yellow.  If you vomit, rehydrate by drinking water, juice, or clear broth. General instructions  If you have sleep apnea, surgery and certain medicines can increase your risk for breathing problems. Follow instructions from your health care provider about wearing your sleep device: ? Anytime you are sleeping, including during daytime naps. ? While taking prescription pain medicines, sleeping medicines, or medicines that make you drowsy.  Return to  your normal activities as told by your health care provider. Ask your health care provider what activities are safe for you.  Take over-the-counter and prescription medicines only as told by your health care provider.  If you smoke, do not smoke without supervision.  Keep all follow-up visits as told by your health care provider. This is important. Contact a health care provider if:  You have nausea or vomiting that does not get better with medicine.  You cannot eat or drink without vomiting.  You have pain that does not get better with medicine.  You are unable to pass urine.  You develop a skin rash.  You have a fever.  You have redness around your IV site that gets worse. Get help right away if:  You have difficulty breathing.  You have chest pain.  You have blood in your urine or stool, or you vomit blood. Summary  After the procedure, it is common to have a sore throat or nausea. It is also common to feel tired.  Have a responsible adult stay with you for the first 24 hours after general anesthesia. It is important to have someone help care for you until you are awake and alert.  When you feel hungry, start by eating small amounts of foods that are soft and easy to digest (bland), such as toast. Gradually return to your regular diet.  Drink enough fluid to keep your urine pale yellow.  Return to your normal activities as told by your health care provider. Ask your health care provider what activities are safe for you. This information is not   intended to replace advice given to you by your health care provider. Make sure you discuss any questions you have with your health care provider. Document Revised: 12/25/2016 Document Reviewed: 08/07/2016 Elsevier Patient Education  2020 [employer]  

## 2019-04-03 ENCOUNTER — Ambulatory Visit: Payer: No Typology Code available for payment source | Admitting: Anesthesiology

## 2019-04-03 ENCOUNTER — Other Ambulatory Visit: Payer: Self-pay

## 2019-04-03 ENCOUNTER — Encounter
Admission: RE | Disposition: A | Discharge status: Home / Self Care | Payer: Self-pay | Source: Home / Self Care | Attending: Gastroenterology

## 2019-04-03 ENCOUNTER — Encounter: Payer: Self-pay | Admitting: Gastroenterology

## 2019-04-03 ENCOUNTER — Ambulatory Visit
Admission: RE | Admit: 2019-04-03 | Discharge: 2019-04-03 | Disposition: A | Discharge status: Home / Self Care | Payer: No Typology Code available for payment source | Attending: Gastroenterology | Admitting: Gastroenterology

## 2019-04-03 DIAGNOSIS — Z79899 Other long term (current) drug therapy: Secondary | ICD-10-CM | POA: Diagnosis not present

## 2019-04-03 DIAGNOSIS — F17210 Nicotine dependence, cigarettes, uncomplicated: Secondary | ICD-10-CM | POA: Diagnosis not present

## 2019-04-03 DIAGNOSIS — J45909 Unspecified asthma, uncomplicated: Secondary | ICD-10-CM | POA: Diagnosis not present

## 2019-04-03 DIAGNOSIS — K648 Other hemorrhoids: Secondary | ICD-10-CM | POA: Insufficient documentation

## 2019-04-03 DIAGNOSIS — K5730 Diverticulosis of large intestine without perforation or abscess without bleeding: Secondary | ICD-10-CM | POA: Insufficient documentation

## 2019-04-03 DIAGNOSIS — K219 Gastro-esophageal reflux disease without esophagitis: Secondary | ICD-10-CM | POA: Insufficient documentation

## 2019-04-03 DIAGNOSIS — Z800 Family history of malignant neoplasm of digestive organs: Secondary | ICD-10-CM | POA: Insufficient documentation

## 2019-04-03 DIAGNOSIS — K921 Melena: Secondary | ICD-10-CM

## 2019-04-03 HISTORY — PX: COLONOSCOPY WITH PROPOFOL: SHX5780

## 2019-04-03 HISTORY — DX: Presence of spectacles and contact lenses: Z97.3

## 2019-04-03 HISTORY — DX: Family history of other specified conditions: Z84.89

## 2019-04-03 LAB — POCT PREGNANCY, URINE: Preg Test, Ur: NEGATIVE

## 2019-04-03 SURGERY — COLONOSCOPY WITH PROPOFOL
Anesthesia: General | Site: Rectum

## 2019-04-03 MED ORDER — PROPOFOL 10 MG/ML IV BOLUS
INTRAVENOUS | Status: DC | PRN
  Administered 2019-04-03 (×2): 100 mg via INTRAVENOUS
  Administered 2019-04-03 (×2): 50 mg via INTRAVENOUS

## 2019-04-03 MED ORDER — LIDOCAINE HCL (CARDIAC) PF 100 MG/5ML IV SOSY
PREFILLED_SYRINGE | INTRAVENOUS | Status: DC | PRN
  Administered 2019-04-03: 50 mg via INTRAVENOUS

## 2019-04-03 MED ORDER — LACTATED RINGERS IV SOLN: INTRAVENOUS | Status: DC

## 2019-04-03 MED ORDER — STERILE WATER FOR IRRIGATION IR SOLN
Status: DC | PRN
  Administered 2019-04-03: 50 mL

## 2019-04-03 SURGICAL SUPPLY — 5 items
CANISTER SUCT 1200ML W/VALVE (MISCELLANEOUS) ×2 IMPLANT
GOWN CVR UNV OPN BCK APRN NK (MISCELLANEOUS) ×2 IMPLANT
GOWN ISOL THUMB LOOP REG UNIV (MISCELLANEOUS) ×4
KIT ENDO PROCEDURE OLY (KITS) ×2 IMPLANT
WATER STERILE IRR 250ML POUR (IV SOLUTION) ×2 IMPLANT

## 2019-04-03 NOTE — Transfer of Care (Signed)
Immediate Anesthesia Transfer of Care Note  Patient: Bonnie Shepard  Procedure(s) Performed: COLONOSCOPY WITH PROPOFOL (N/A Rectum)  Patient Location: PACU  Anesthesia Type: General  Level of Consciousness: awake, alert  and patient cooperative  Airway and Oxygen Therapy: Patient Spontanous Breathing and Patient connected to supplemental oxygen  Post-op Assessment: Post-op Vital signs reviewed, Patient's Cardiovascular Status Stable, Respiratory Function Stable, Patent Airway and No signs of Nausea or vomiting  Post-op Vital Signs: Reviewed and stable  Complications: No apparent anesthesia complications

## 2019-04-03 NOTE — Anesthesia Procedure Notes (Signed)
Procedure Name: General with mask airway Date/Time: 04/03/2019 11:03 AM Performed by: Jinny Blossom, CRNA Pre-anesthesia Checklist: Patient identified, Emergency Drugs available, Suction available, Patient being monitored and Timeout performed Patient Re-evaluated:Patient Re-evaluated prior to induction Oxygen Delivery Method: Nasal cannula

## 2019-04-03 NOTE — Interval H&P Note (Signed)
History and Physical Interval Note:  04/03/2019 1:51 PM  Bonnie Shepard  has presented today for surgery, with the diagnosis of Hematochezia K92.1.  The various methods of treatment have been discussed with the patient and family. After consideration of risks, benefits and other options for treatment, the patient has consented to  Procedure(s) with comments: COLONOSCOPY WITH PROPOFOL (N/A) - Priority 3 as a surgical intervention.  The patient's history has been reviewed, patient examined, no change in status, stable for surgery.  I have reviewed the patient's chart and labs.  Questions were answered to the patient's satisfaction.     Joshawa Dubin [employer]

## 2019-04-03 NOTE — Anesthesia Preprocedure Evaluation (Addendum)
Anesthesia Evaluation  Patient identified by MRN, date of birth, ID band Patient awake    Reviewed: Allergy & Precautions, NPO status , Patient's Chart, lab work & pertinent test results  History of Anesthesia Complications Negative for: history of anesthetic complications  Airway Mallampati: III  TM Distance: >3 FB Neck ROM: Full    Dental   Pulmonary asthma , Current Smoker and Patient abstained from smoking.,    breath sounds clear to auscultation       Cardiovascular (-) angina(-) DOE  Rhythm:Regular Rate:Normal     Neuro/Psych PSYCHIATRIC DISORDERS Anxiety Depression    GI/Hepatic GERD  Controlled,  Endo/Other    Renal/GU      Musculoskeletal   Abdominal (+) + obese (BMI 43),   Peds  Hematology   Anesthesia Other Findings   Reproductive/Obstetrics                            Anesthesia Physical Anesthesia Plan  ASA: II  Anesthesia Plan: General   Post-op Pain Management:    Induction: Intravenous  PONV Risk Score and Plan: 2 and Propofol infusion, TIVA and Treatment may vary due to age or medical condition  Airway Management Planned: Natural Airway and Nasal Cannula  Additional Equipment:   Intra-op Plan:   Post-operative Plan:   Informed Consent: I have reviewed the patients History and Physical, chart, labs and discussed the procedure including the risks, benefits and alternatives for the proposed anesthesia with the patient or authorized representative who has indicated his/her understanding and acceptance.       Plan Discussed with: CRNA and Anesthesiologist  Anesthesia Plan Comments:         Anesthesia Quick Evaluation

## 2019-04-03 NOTE — Anesthesia Postprocedure Evaluation (Signed)
Anesthesia Post Note  Patient: Bonnie Shepard  Procedure(s) Performed: COLONOSCOPY WITH PROPOFOL (N/A Rectum)     Patient location during evaluation: PACU Anesthesia Type: General Level of consciousness: awake and alert Pain management: pain level controlled Vital Signs Assessment: post-procedure vital signs reviewed and stable Respiratory status: spontaneous breathing, nonlabored ventilation, respiratory function stable and patient connected to nasal cannula oxygen Cardiovascular status: blood pressure returned to baseline and stable Postop Assessment: no apparent nausea or vomiting Anesthetic complications: no    Encarnacion Bole A  Ricci Dirocco

## 2019-04-03 NOTE — Op Note (Signed)
[facility] Patient Name: Bonnie Shepard Procedure Date: 04/03/2019 10:45 AM MRN: [id number] Account #: [id number] Date of Birth: 08/27/1985 Admit Type: Outpatient Age: 34 Room: [facility] OR ROOM 01 Gender: Female Note Status: Finalized Procedure:             Colonoscopy Indications:           Hematochezia Providers:             Midge Minium MD, MD Referring MD:          Eula Fried. Flinchum (Referring MD) Medicines:             Propofol per Anesthesia Complications:         No immediate complications. Procedure:             Pre-Anesthesia Assessment:                        - Prior to the procedure, a History and Physical was                         performed, and patient medications and allergies were                         reviewed. The patient's tolerance of previous                         anesthesia was also reviewed. The risks and benefits                         of the procedure and the sedation options and risks                         were discussed with the patient. All questions were                         answered, and informed consent was obtained. Prior                         Anticoagulants: The patient has taken no previous                         anticoagulant or antiplatelet agents. ASA Grade                         Assessment: II - A patient with mild systemic disease.                         After reviewing the risks and benefits, the patient                         was deemed in satisfactory condition to undergo the                         procedure.                        After obtaining informed consent, the colonoscope was                         passed  under direct vision. Throughout the procedure,                         the patient's blood pressure, pulse, and oxygen                         saturations were monitored continuously. The was                         introduced through the anus and advanced to the the            cecum, identified by appendiceal orifice and ileocecal                         valve. The colonoscopy was performed without                         difficulty. The patient tolerated the procedure well.                         The quality of the bowel preparation was excellent. Findings:      The perianal and digital rectal examinations were normal.      Non-bleeding internal hemorrhoids were found during retroflexion. The       hemorrhoids were Grade II (internal hemorrhoids that prolapse but reduce       spontaneously).      A few small-mouthed diverticula were found in the sigmoid colon. Impression:            - Non-bleeding internal hemorrhoids.                        - Diverticulosis in the sigmoid colon.                        - No specimens collected. Recommendation:        - Discharge patient to home.                        - Resume previous diet.                        - Continue present medications. Procedure Code(s):     --- Professional ---                        [phone], Colonoscopy, flexible; diagnostic, including                         collection of specimen(s) by brushing or washing, when                         performed (separate procedure) Diagnosis Code(s):     --- Professional ---                        K92.1, Melena (includes Hematochezia) CPT copyright 2019 American Medical Association. All rights reserved. The codes documented in this report are preliminary and upon coder review may  be revised to meet current compliance requirements. Midge Minium MD, MD 04/03/2019 11:06:34 AM This report has been signed electronically. Number of Addenda: 0 Note Initiated On: 04/03/2019 10:45 AM  Scope Withdrawal Time: 0 hours 7 minutes 1 second  Total Procedure Duration: 0 hours 10 minutes 22 seconds  Estimated Blood Loss:  Estimated blood loss: none.      [facility]

## 2019-04-04 ENCOUNTER — Encounter: Payer: Self-pay | Admitting: Adult Health

## 2019-04-04 ENCOUNTER — Telehealth: Payer: Self-pay | Admitting: Adult Health

## 2019-04-04 ENCOUNTER — Encounter: Payer: Self-pay | Admitting: *Deleted

## 2019-04-04 ENCOUNTER — Encounter: Payer: Self-pay | Admitting: General Practice

## 2019-04-04 MED ORDER — ESCITALOPRAM OXALATE 10 MG PO TABS: 10.0000 mg | ORAL_TABLET | Freq: Every day | ORAL | 1 refills | Status: DC

## 2019-04-04 NOTE — Telephone Encounter (Signed)
Patient contacted Ms. Flinchum through my chart requesting to cancel Lexapro from [facility] pharmacy and send to [facility] pharmacy due to cost and insurance coverage. I contacted pharmacist at [facility] and cancelled prescription for Lexapro that was sent on 03/23/2019. Will electronically send over script for Lexapro to [facility] pharmacy. KW

## 2019-04-27 ENCOUNTER — Encounter: Payer: Self-pay | Admitting: Adult Health

## 2019-05-17 ENCOUNTER — Encounter: Payer: Self-pay | Admitting: Adult Health

## 2019-05-17 ENCOUNTER — Telehealth: Payer: Self-pay

## 2019-05-17 DIAGNOSIS — K219 Gastro-esophageal reflux disease without esophagitis: Secondary | ICD-10-CM

## 2019-05-17 MED ORDER — PANTOPRAZOLE SODIUM 20 MG PO TBEC: 20.0000 mg | DELAYED_RELEASE_TABLET | Freq: Every day | ORAL | 1 refills | Status: DC

## 2019-05-17 NOTE — Telephone Encounter (Signed)
Patient sent in refill request through Mychart for Protonix and requested that future refilled to [facility] pharmacy. KW

## 2019-06-08 ENCOUNTER — Other Ambulatory Visit: Payer: Self-pay

## 2019-06-08 ENCOUNTER — Emergency Department: Payer: No Typology Code available for payment source

## 2019-06-08 ENCOUNTER — Emergency Department
Admission: EM | Admit: 2019-06-08 | Discharge: 2019-06-08 | Disposition: A | Discharge status: Home / Self Care | Payer: No Typology Code available for payment source | Attending: Emergency Medicine | Admitting: Emergency Medicine

## 2019-06-08 DIAGNOSIS — R112 Nausea with vomiting, unspecified: Secondary | ICD-10-CM | POA: Insufficient documentation

## 2019-06-08 DIAGNOSIS — R109 Unspecified abdominal pain: Secondary | ICD-10-CM | POA: Diagnosis not present

## 2019-06-08 DIAGNOSIS — K625 Hemorrhage of anus and rectum: Secondary | ICD-10-CM | POA: Insufficient documentation

## 2019-06-08 LAB — COMPREHENSIVE METABOLIC PANEL
ALT: 89 U/L — ABNORMAL HIGH (ref 0–44)
AST: 44 U/L — ABNORMAL HIGH (ref 15–41)
Albumin: 4.5 g/dL (ref 3.5–5.0)
Alkaline Phosphatase: 95 U/L (ref 38–126)
Anion gap: 13 (ref 5–15)
BUN: 20 mg/dL (ref 6–20)
CO2: 23 mmol/L (ref 22–32)
Calcium: 9.8 mg/dL (ref 8.9–10.3)
Chloride: 103 mmol/L (ref 98–111)
Creatinine, Ser: 0.63 mg/dL (ref 0.44–1.00)
GFR calc Af Amer: >60 mL/min (ref >60)
GFR calc non Af Amer: >60 mL/min (ref >60)
Glucose, Bld: 170 mg/dL — ABNORMAL HIGH (ref 70–99)
Potassium: 3.5 mmol/L (ref 3.5–5.1)
Sodium: 139 mmol/L (ref 135–145)
Total Bilirubin: 0.8 mg/dL (ref 0.3–1.2)
Total Protein: 8.2 g/dL — ABNORMAL HIGH (ref 6.5–8.1)

## 2019-06-08 LAB — CBC
HCT: 44.7 % (ref 36.0–46.0)
Hemoglobin: 15.5 g/dL — ABNORMAL HIGH (ref 12.0–15.0)
MCH: 31.4 pg (ref 26.0–34.0)
MCHC: 34.7 g/dL (ref 30.0–36.0)
MCV: 90.5 fL (ref 80.0–100.0)
Platelets: 268 10*3/uL (ref 150–400)
RBC: 4.94 MIL/uL (ref 3.87–5.11)
RDW: 12.3 % (ref 11.5–15.5)
WBC: 10.4 10*3/uL (ref 4.0–10.5)
nRBC: 0 % (ref 0.0–0.2)

## 2019-06-08 LAB — TYPE AND SCREEN
ABO/RH(D): O POS
Antibody Screen: NEGATIVE

## 2019-06-08 MED ORDER — PROMETHAZINE HCL 25 MG PO TABS: 25.0000 mg | ORAL_TABLET | ORAL | 1 refills | Status: DC | PRN

## 2019-06-08 MED ORDER — FAMOTIDINE IN NACL 20-0.9 MG/50ML-% IV SOLN
20.0000 mg | Freq: Once | INTRAVENOUS | Status: AC
  Administered 2019-06-08: 20 mg via INTRAVENOUS
  Filled 2019-06-08: qty 50

## 2019-06-08 MED ORDER — SODIUM CHLORIDE 0.9 % IV SOLN: Freq: Once | INTRAVENOUS | Status: AC

## 2019-06-08 MED ORDER — OXYCODONE-ACETAMINOPHEN 5-325 MG PO TABS: 1.0000 | ORAL_TABLET | Freq: Two times a day (BID) | ORAL | 0 refills | Status: DC | PRN

## 2019-06-08 MED ORDER — HYDROMORPHONE HCL 1 MG/ML IJ SOLN
INTRAMUSCULAR | Status: AC
  Administered 2019-06-08: 1 mg via INTRAVENOUS
  Filled 2019-06-08: qty 1

## 2019-06-08 MED ORDER — ONDANSETRON HCL 4 MG/2ML IJ SOLN
4.0000 mg | Freq: Once | INTRAMUSCULAR | Status: AC
  Administered 2019-06-08: 4 mg via INTRAVENOUS
  Filled 2019-06-08: qty 2

## 2019-06-08 MED ORDER — ONDANSETRON HCL 4 MG/2ML IJ SOLN
INTRAMUSCULAR | Status: AC
  Administered 2019-06-08: 4 mg via INTRAVENOUS
  Filled 2019-06-08: qty 2

## 2019-06-08 MED ORDER — FENTANYL CITRATE (PF) 100 MCG/2ML IJ SOLN
50.0000 ug | INTRAMUSCULAR | Status: DC | PRN
  Administered 2019-06-08: 50 ug via INTRAVENOUS
  Filled 2019-06-08: qty 2

## 2019-06-08 MED ORDER — IOHEXOL 300 MG/ML  SOLN
100.0000 mL | Freq: Once | INTRAMUSCULAR | Status: AC | PRN
  Administered 2019-06-08: 100 mL via INTRAVENOUS

## 2019-06-08 MED ORDER — ONDANSETRON HCL 4 MG/2ML IJ SOLN: 4.0000 mg | Freq: Once | INTRAMUSCULAR | Status: AC

## 2019-06-08 MED ORDER — HYDROMORPHONE HCL 1 MG/ML IJ SOLN: 1.0000 mg | Freq: Once | INTRAMUSCULAR | Status: AC

## 2019-06-08 MED ORDER — PROMETHAZINE HCL 25 MG/ML IJ SOLN
25.0000 mg | Freq: Once | INTRAMUSCULAR | Status: AC
  Administered 2019-06-08: 25 mg via INTRAVENOUS
  Filled 2019-06-08: qty 1

## 2019-06-08 NOTE — ED Notes (Signed)
Pt denies any pain at present, states she has mild nausea but is feeling a lot better.

## 2019-06-08 NOTE — ED Provider Notes (Signed)
ER Provider Note       Time seen: 11:46 AM    I have reviewed the vital signs and the nursing notes.  HISTORY   Chief Complaint Abdominal Pain    HPI Bonnie Shepard is a 34 y.o. female with a history of GERD, asthma, gastrointestinal bleeding who presents today for rectal bleeding and lower abdominal pain with uncontrollable nausea vomiting.  Patient arrives diaphoretic and vomiting.  Describes pain is 7 out of 10 in her abdomen.  Past Medical History:  Diagnosis Date  . Acid reflux   . Asthma   . Family history of adverse reaction to anesthesia    Mother - PONV  . Infection    freq UTI  . Vaginal Pap smear, abnormal    f/u wnl  . Wears contact lenses     Past Surgical History:  Procedure Laterality Date  . COLONOSCOPY WITH PROPOFOL N/A 04/03/2019   Procedure: COLONOSCOPY WITH PROPOFOL;  Surgeon: Lucilla Lame, MD;  Location: [place];  Service: Endoscopy;  Laterality: N/A;  Priority 3  . DILATION AND EVACUATION N/A 09/07/2013   Procedure: DILATATION AND EVACUATION;  Surgeon: Farrel Gobble. Harrington Challenger, MD;  Location: [facility];  Service: Gynecology;  Laterality: N/A;  . TONSILLECTOMY      Allergies Patient has no known allergies.   Review of Systems Constitutional: Negative for fever. Cardiovascular: Negative for chest pain. Respiratory: Negative for shortness of breath. Gastrointestinal: Positive for abdominal pain, vomiting, rectal bleeding Musculoskeletal: Negative for back pain. Skin: Negative for rash. Neurological: Negative for headaches, focal weakness or numbness.  All systems negative/normal/unremarkable except as stated in the HPI  ____________________________________________   PHYSICAL EXAM:  VITAL SIGNS: Vitals:   06/08/19 0816  BP: (!) 182/100  Pulse: 72  Resp: 18  SpO2: 100%    Constitutional: Alert and oriented.  Mild distress Eyes: Conjunctivae are normal. Normal extraocular movements. Cardiovascular: Normal rate, regular rhythm. No  murmurs, rubs, or gallops. Respiratory: Normal respiratory effort without tachypnea nor retractions. Breath sounds are clear and equal bilaterally. No wheezes/rales/rhonchi. Gastrointestinal: Soft and nontender. Normal bowel sounds Musculoskeletal: Nontender with normal range of motion in extremities. No lower extremity tenderness nor edema. Neurologic:  Normal speech and language. No gross focal neurologic deficits are appreciated.  Skin:  Skin is warm, dry and intact. No rash noted. Psychiatric: Speech and behavior are normal.   ____________________________________________   LABS (pertinent positives/negatives)  Labs Reviewed  COMPREHENSIVE METABOLIC PANEL - Abnormal; Notable for the following components:      Result Value   Glucose, Bld 170 (*)    Total Protein 8.2 (*)    AST 44 (*)    ALT 89 (*)    All other components within normal limits  CBC - Abnormal; Notable for the following components:   Hemoglobin 15.5 (*)    All other components within normal limits  POC OCCULT BLOOD, ED  POC URINE PREG, ED  TYPE AND SCREEN    RADIOLOGY  Images were viewed by me CT the abdomen pelvis with contrast IMPRESSION: No acute abnormality or finding to explain the patient's symptoms.  Fatty infiltration of liver.  Mild sigmoid diverticulosis without diverticulitis.  Small fat containing umbilical hernia.   DIFFERENTIAL DIAGNOSIS  Gastroenteritis, dehydration, electrolyte abnormality, colitis, inflammatory bowel disorder  ASSESSMENT AND PLAN  Abdominal pain, vomiting, rectal bleeding   Plan: The patient had presented for abdominal pain with intractable vomiting and rectal bleeding. Patient's labs were initially unremarkable.  She did receive IV narcotics and  antiemetics.  CT not reveal any acute process.  Patient states her recent bowel movement in the ER was normal.  She appears to be improving, will be discharged with antiemetics and outpatient GI follow-up.  Daryel November MD    Note: This note was generated in part or whole with voice recognition software. Voice recognition is usually quite accurate but there are transcription errors that can and very often do occur. I apologize for any typographical errors that were not detected and corrected.     Emily Filbert, MD 06/08/19 1226

## 2019-06-08 NOTE — ED Triage Notes (Signed)
Pt states she woke up around 6am with rectal bleeding and since having lower abd pain with uncontrolled N/V, pt is diaphoretic on arrival, actively vomiting.

## 2019-06-09 ENCOUNTER — Encounter: Payer: Self-pay | Admitting: Adult Health

## 2019-07-14 ENCOUNTER — Encounter: Payer: Self-pay | Admitting: Adult Health

## 2019-07-19 ENCOUNTER — Encounter: Payer: Self-pay | Admitting: Adult Health

## 2019-07-21 ENCOUNTER — Telehealth (INDEPENDENT_AMBULATORY_CARE_PROVIDER_SITE_OTHER): Payer: No Typology Code available for payment source | Admitting: Adult Health

## 2019-07-21 DIAGNOSIS — Z87898 Personal history of other specified conditions: Secondary | ICD-10-CM | POA: Insufficient documentation

## 2019-07-21 DIAGNOSIS — K219 Gastro-esophageal reflux disease without esophagitis: Secondary | ICD-10-CM | POA: Diagnosis not present

## 2019-07-21 DIAGNOSIS — R1013 Epigastric pain: Secondary | ICD-10-CM | POA: Diagnosis not present

## 2019-07-21 DIAGNOSIS — R110 Nausea: Secondary | ICD-10-CM

## 2019-07-21 MED ORDER — PANTOPRAZOLE SODIUM 40 MG PO TBEC: 40.0000 mg | DELAYED_RELEASE_TABLET | Freq: Every day | ORAL | 0 refills | Status: DC

## 2019-07-21 NOTE — Progress Notes (Addendum)
Virtual Visit via Video Note  I connected with Jalaysia Lobb on 07/26/19 at  3:40 PM EDT by a video enabled telemedicine application and verified that I am speaking with the correct person using two identifiers.  Location: Patient: at her home  Provider: Provider: Provider's office at  [facility], [place].    I discussed the limitations of evaluation and management by telemedicine and the availability of in person appointments. The patient expressed understanding and agreed to proceed.    I discussed the assessment and treatment plan with the patient. The patient was provided an opportunity to ask questions and all were answered. The patient agreed with the plan and demonstrated an understanding of the instructions.   The patient was advised to call back or seek an in-person evaluation if the symptoms worsen or if the condition fails to improve as anticipated.  I provided 25 minutes of non-face-to-face time during this encounter.   Jairo Ben, FNP   Acute Office Visit- video   Subjective:    Patient ID: Bonnie Shepard, female    DOB: February 18, 1985, 34 y.o.   MRN: [id number]  No chief complaint on file.   HPI Patient is in today for  Sinus pressure/ sinus drainage x 2 days.   Nausea has improved. She went home from work sick July 1st eating chicken stir fru with Nausea/ Vomiting and it resolved after four days.   She had a second episode lasted about 12 hours after eating grilled chicken salad.   She called out of work for nausea/ vomiting 07/19/2019.Vomiting resolved on the 14th. Pedialyte resolved.  She does have GERD symptoms.   Denies any recent tick bites.   She has a follow up with GI.   Patient  denies any fever, body aches,chills, rash, chest pain, shortness of breath, nausea, vomiting, or diarrhea.  Denies dizziness, lightheadedness, pre syncopal or syncopal episodes.    No LMP recorded. (Menstrual status: Irregular Periods). LMP  07/12/2019.    Past Medical History:  Diagnosis Date  . Acid reflux   . Asthma   . Family history of adverse reaction to anesthesia    Mother - PONV  . Infection    freq UTI  . Vaginal Pap smear, abnormal    f/u wnl  . Wears contact lenses     Past Surgical History:  Procedure Laterality Date  . COLONOSCOPY WITH PROPOFOL N/A 04/03/2019   Procedure: COLONOSCOPY WITH PROPOFOL;  Surgeon: Midge Minium, MD;  Location: [facility];  Service: Endoscopy;  Laterality: N/A;  Priority 3  . DILATION AND EVACUATION N/A 09/07/2013   Procedure: DILATATION AND EVACUATION;  Surgeon: Freddrick March. Tenny Craw, MD;  Location: [facility];  Service: Gynecology;  Laterality: N/A;  . TONSILLECTOMY      Family History  Problem Relation Age of Onset  . Hypertension Other   . Cancer Other   . Hypertension Mother   . Hypertension Father   . Hypertension Maternal Grandmother   . Heart disease Maternal Grandmother   . Hearing loss Neg Hx     Social History   Socioeconomic History  . Marital status: Single    Spouse name: Not on file  . Number of children: Not on file  . Years of education: Not on file  . Highest education level: Not on file  Occupational History  . Not on file  Tobacco Use  . Smoking status: Current Every Day Smoker    Packs/day: 0.25    Years: 16.00  Pack years: 4.00    Types: Cigarettes  . Smokeless tobacco: Never Used  . Tobacco comment: since age 44  Vaping Use  . Vaping Use: Never used  Substance and Sexual Activity  . Alcohol use: No    Comment: occasionally  . Drug use: No  . Sexual activity: Yes    Birth control/protection: None  Other Topics Concern  . Not on file  Social History Narrative  . Not on file   Social Determinants of Health   Financial Resource Strain:   . Difficulty of Paying Living Expenses:   Food Insecurity:   . Worried About Programme researcher, broadcasting/film/video in the Last Year:   . Barista in the Last Year:   Transportation Needs:   . Automotive engineer (Medical):   Marland Kitchen Lack of Transportation (Non-Medical):   Physical Activity:   . Days of Exercise per Week:   . Minutes of Exercise per Session:   Stress:   . Feeling of Stress :   Social Connections:   . Frequency of Communication with Friends and Family:   . Frequency of Social Gatherings with Friends and Family:   . Attends Religious Services:   . Active Member of Clubs or Organizations:   . Attends Banker Meetings:   Marland Kitchen Marital Status:   Intimate Partner Violence:   . Fear of Current or Ex-Partner:   . Emotionally Abused:   Marland Kitchen Physically Abused:   . Sexually Abused:     Outpatient Medications Prior to Visit  Medication Sig Dispense Refill  . cyclobenzaprine (FLEXERIL) 10 MG tablet Take 1 tablet (10 mg total) by mouth at bedtime. 30 tablet 0  . escitalopram (LEXAPRO) 10 MG tablet Take 1 tablet (10 mg total) by mouth daily. 90 tablet 1  . hydrocortisone (ANUSOL-HC) 25 MG suppository Place 1 suppository (25 mg total) rectally 2 (two) times daily. (Patient not taking: Reported on 03/28/2019) 12 suppository 0  . oxyCODONE-acetaminophen (PERCOCET) 5-325 MG tablet Take 1 tablet by mouth 2 (two) times daily as needed. 20 tablet 0  . promethazine (PHENERGAN) 25 MG tablet Take 1 tablet (25 mg total) by mouth every 4 (four) hours as needed for nausea or vomiting. 30 tablet 1  . pantoprazole (PROTONIX) 20 MG tablet Take 1 tablet (20 mg total) by mouth daily. 30 tablet 1   No facility-administered medications prior to visit.    No Known Allergies  Review of Systems  Constitutional: Negative.   HENT: Positive for congestion and postnasal drip.   Gastrointestinal: Positive for nausea and vomiting. Negative for abdominal distention.  Genitourinary: Negative.   Musculoskeletal: Negative.   Skin: Negative.   Neurological: Negative.   Psychiatric/Behavioral: Negative.        Objective:    Physical Exam    Patient is alert and oriented and responsive to  questions Engages in conversation with provider. Speaks in full sentences without any pauses without any shortness of breath or distress.    There were no vitals taken for this visit. Wt Readings from Last 3 Encounters:  06/08/19 230 lb (104.3 kg)  04/03/19 235 lb (106.6 kg)  03/28/19 240 lb (108.9 kg)    Health Maintenance Due  Topic Date Due  . HEMOGLOBIN A1C  Never done  . PNEUMOCOCCAL POLYSACCHARIDE VACCINE AGE 27-64 HIGH RISK  Never done  . FOOT EXAM  Never done  . OPHTHALMOLOGY EXAM  Never done  . URINE MICROALBUMIN  Never done  . COVID-19 Vaccine (1)  Never done    There are no preventive care reminders to display for this patient.   Lab Results  Component Value Date   TSH 1.160 01/20/2019   Lab Results  Component Value Date   WBC 7.1 07/22/2019   HGB 13.5 07/22/2019   HCT 39.3 07/22/2019   MCV 90.3 07/22/2019   PLT 185 07/22/2019   Lab Results  Component Value Date   NA 137 07/22/2019   K 3.9 07/22/2019   CO2 26 07/22/2019   GLUCOSE 106 (H) 07/22/2019   BUN 12 07/22/2019   CREATININE 0.69 07/22/2019   BILITOT 0.8 07/22/2019   ALKPHOS 74 07/22/2019   AST 44 (H) 07/22/2019   ALT 65 (H) 07/22/2019   PROT 7.7 07/22/2019   ALBUMIN 4.3 07/22/2019   CALCIUM 9.2 07/22/2019   ANIONGAP 10 07/22/2019   Lab Results  Component Value Date   CHOL 181 01/20/2019   Lab Results  Component Value Date   HDL 38 (L) 01/20/2019   Lab Results  Component Value Date   LDLCALC 126 (H) 01/20/2019   Lab Results  Component Value Date   TRIG 92 01/20/2019   No results found for: CHOLHDL No results found for: KDTO6Z     Assessment & Plan:   Problem List Items Addressed This Visit      Digestive   Gastroesophageal reflux disease - Primary   Relevant Medications   pantoprazole (PROTONIX) 40 MG tablet     Other   Nausea   Relevant Orders   Comprehensive Metabolic Panel (CMET)   Lipase   Amylase   CBC with Differential/Platelet   Hepatitis panel, acute    History of nausea and vomiting   Relevant Orders   Comprehensive Metabolic Panel (CMET)   Lipase   Amylase   CBC with Differential/Platelet   Hepatitis panel, acute   Epigastric burning sensation   Relevant Medications   pantoprazole (PROTONIX) 40 MG tablet      She needed refill on the below.  Meds ordered this encounter  Medications  . pantoprazole (PROTONIX) 40 MG tablet    Sig: Take 1 tablet (40 mg total) by mouth daily.    Dispense:  90 tablet    Refill:  0    REVIEWED ALL OF THE ABOVE INFORMATION FOR VIDEO VISIT WITH PATIENT. THIS WAS A VIDEO VISIT ONLY.  Advised patient to keep follow-up with gastroenterologist. She will go for lab work. Advised patient call the office or your primary care doctor for an appointment if no improvement within 72 hours or if any symptoms change or worsen at any time  Advised ER or urgent Care if after hours or on weekend. Call 911 for emergency symptoms at any time.Patinet verbalized understanding of all instructions given/reviewed and treatment plan and has no further questions or concerns at this time.    Red Flags discussed. The patient was given clear instructions to go to ER or return to medical center if any red flags develop, symptoms do not improve, worsen or new problems develop. They verbalized understanding.  Jairo Ben, FNP

## 2019-07-22 ENCOUNTER — Other Ambulatory Visit
Admission: RE | Admit: 2019-07-22 | Discharge: 2019-07-22 | Disposition: A | Discharge status: Home / Self Care | Payer: No Typology Code available for payment source | Attending: Adult Health | Admitting: Adult Health

## 2019-07-22 DIAGNOSIS — R112 Nausea with vomiting, unspecified: Secondary | ICD-10-CM | POA: Insufficient documentation

## 2019-07-22 LAB — COMPREHENSIVE METABOLIC PANEL
ALT: 65 U/L — ABNORMAL HIGH (ref 0–44)
AST: 44 U/L — ABNORMAL HIGH (ref 15–41)
Albumin: 4.3 g/dL (ref 3.5–5.0)
Alkaline Phosphatase: 74 U/L (ref 38–126)
Anion gap: 10 (ref 5–15)
BUN: 12 mg/dL (ref 6–20)
CO2: 26 mmol/L (ref 22–32)
Calcium: 9.2 mg/dL (ref 8.9–10.3)
Chloride: 101 mmol/L (ref 98–111)
Creatinine, Ser: 0.69 mg/dL (ref 0.44–1.00)
GFR calc Af Amer: >60 mL/min (ref >60)
GFR calc non Af Amer: >60 mL/min (ref >60)
Glucose, Bld: 106 mg/dL — ABNORMAL HIGH (ref 70–99)
Potassium: 3.9 mmol/L (ref 3.5–5.1)
Sodium: 137 mmol/L (ref 135–145)
Total Bilirubin: 0.8 mg/dL (ref 0.3–1.2)
Total Protein: 7.7 g/dL (ref 6.5–8.1)

## 2019-07-22 LAB — CBC WITH DIFFERENTIAL/PLATELET
Abs Immature Granulocytes: 0.03 10*3/uL (ref 0.00–0.07)
Basophils Absolute: 0 10*3/uL (ref 0.0–0.1)
Basophils Relative: 0 %
Eosinophils Absolute: 0 10*3/uL (ref 0.0–0.5)
Eosinophils Relative: 0 %
HCT: 39.3 % (ref 36.0–46.0)
Hemoglobin: 13.5 g/dL (ref 12.0–15.0)
Immature Granulocytes: 0 %
Lymphocytes Relative: 24 %
Lymphs Abs: 1.7 10*3/uL (ref 0.7–4.0)
MCH: 31 pg (ref 26.0–34.0)
MCHC: 34.4 g/dL (ref 30.0–36.0)
MCV: 90.3 fL (ref 80.0–100.0)
Monocytes Absolute: 0.8 10*3/uL (ref 0.1–1.0)
Monocytes Relative: 11 %
Neutro Abs: 4.6 10*3/uL (ref 1.7–7.7)
Neutrophils Relative %: 65 %
Platelets: 185 10*3/uL (ref 150–400)
RBC: 4.35 MIL/uL (ref 3.87–5.11)
RDW: 12.4 % (ref 11.5–15.5)
WBC: 7.1 10*3/uL (ref 4.0–10.5)
nRBC: 0 % (ref 0.0–0.2)

## 2019-07-22 LAB — LIPASE, BLOOD: Lipase: 32 U/L (ref 11–51)

## 2019-07-22 LAB — HEPATITIS PANEL, ACUTE
HCV Ab: NONREACTIVE
Hep A IgM: NONREACTIVE
Hep B C IgM: NONREACTIVE
Hepatitis B Surface Ag: NONREACTIVE

## 2019-07-22 LAB — AMYLASE: Amylase: 58 U/L (ref 28–100)

## 2019-07-24 NOTE — Progress Notes (Signed)
Acute hepatitis panel was negative.  Amylase and lipase within normal limits.  Glucose mildly elevated at 106.  AST and ALT are mildly elevated, avoid alcohol or Tylenol.  CBC was open normal limits keep your follow-up with the GI doctor.  If you develop any symptoms of fever chills or persistent nausea and vomiting or have diarrhea please schedule a sooner follow-up.

## 2019-07-25 ENCOUNTER — Encounter: Payer: Self-pay | Admitting: Adult Health

## 2019-07-26 ENCOUNTER — Encounter: Payer: Self-pay | Admitting: Adult Health

## 2019-07-26 ENCOUNTER — Ambulatory Visit: Payer: No Typology Code available for payment source | Admitting: Adult Health

## 2019-08-08 ENCOUNTER — Encounter: Payer: Self-pay | Admitting: Adult Health

## 2019-08-09 ENCOUNTER — Other Ambulatory Visit: Payer: Self-pay

## 2019-08-09 ENCOUNTER — Other Ambulatory Visit
Admission: RE | Admit: 2019-08-09 | Discharge: 2019-08-09 | Disposition: A | Discharge status: Home / Self Care | Payer: No Typology Code available for payment source | Attending: Adult Health | Admitting: Adult Health

## 2019-08-09 ENCOUNTER — Telehealth (INDEPENDENT_AMBULATORY_CARE_PROVIDER_SITE_OTHER): Payer: No Typology Code available for payment source | Admitting: Adult Health

## 2019-08-09 ENCOUNTER — Encounter: Payer: Self-pay | Admitting: Adult Health

## 2019-08-09 DIAGNOSIS — R591 Generalized enlarged lymph nodes: Secondary | ICD-10-CM | POA: Insufficient documentation

## 2019-08-09 DIAGNOSIS — R5383 Other fatigue: Secondary | ICD-10-CM | POA: Diagnosis not present

## 2019-08-09 LAB — CBC WITH DIFFERENTIAL/PLATELET
Abs Immature Granulocytes: 0.03 10*3/uL (ref 0.00–0.07)
Basophils Absolute: 0 10*3/uL (ref 0.0–0.1)
Basophils Relative: 1 %
Eosinophils Absolute: 0.2 10*3/uL (ref 0.0–0.5)
Eosinophils Relative: 3 %
HCT: 39.5 % (ref 36.0–46.0)
Hemoglobin: 13.3 g/dL (ref 12.0–15.0)
Immature Granulocytes: 0 %
Lymphocytes Relative: 30 %
Lymphs Abs: 2.4 10*3/uL (ref 0.7–4.0)
MCH: 30.9 pg (ref 26.0–34.0)
MCHC: 33.7 g/dL (ref 30.0–36.0)
MCV: 91.9 fL (ref 80.0–100.0)
Monocytes Absolute: 0.5 10*3/uL (ref 0.1–1.0)
Monocytes Relative: 6 %
Neutro Abs: 5 10*3/uL (ref 1.7–7.7)
Neutrophils Relative %: 60 %
Platelets: 219 10*3/uL (ref 150–400)
RBC: 4.3 MIL/uL (ref 3.87–5.11)
RDW: 12.4 % (ref 11.5–15.5)
WBC: 8.2 10*3/uL (ref 4.0–10.5)
nRBC: 0 % (ref 0.0–0.2)

## 2019-08-09 LAB — MONONUCLEOSIS SCREEN: Mono Screen: NEGATIVE

## 2019-08-09 LAB — COMPREHENSIVE METABOLIC PANEL
ALT: 56 U/L — ABNORMAL HIGH (ref 0–44)
AST: 35 U/L (ref 15–41)
Albumin: 4.4 g/dL (ref 3.5–5.0)
Alkaline Phosphatase: 85 U/L (ref 38–126)
Anion gap: 10 (ref 5–15)
BUN: 13 mg/dL (ref 6–20)
CO2: 25 mmol/L (ref 22–32)
Calcium: 8.9 mg/dL (ref 8.9–10.3)
Chloride: 102 mmol/L (ref 98–111)
Creatinine, Ser: 0.96 mg/dL (ref 0.44–1.00)
GFR calc Af Amer: >60 mL/min (ref >60)
GFR calc non Af Amer: >60 mL/min (ref >60)
Glucose, Bld: 112 mg/dL — ABNORMAL HIGH (ref 70–99)
Potassium: 3.7 mmol/L (ref 3.5–5.1)
Sodium: 137 mmol/L (ref 135–145)
Total Bilirubin: 0.7 mg/dL (ref 0.3–1.2)
Total Protein: 7.5 g/dL (ref 6.5–8.1)

## 2019-08-09 MED ORDER — AMOXICILLIN-POT CLAVULANATE 875-125 MG PO TABS
1.0000 | ORAL_TABLET | Freq: Two times a day (BID) | ORAL | 0 refills | Status: DC
Start: 2019-08-09 — End: 2019-10-10

## 2019-08-09 MED ORDER — FLUCONAZOLE 150 MG PO TABS
150.0000 mg | ORAL_TABLET | Freq: Once | ORAL | 0 refills | Status: AC
Start: 2019-08-09 — End: 2019-08-09

## 2019-08-09 NOTE — Patient Instructions (Signed)

## 2019-08-09 NOTE — Progress Notes (Signed)
MyChart Video Visit    Virtual Visit via Video Note   This visit type was conducted due to national recommendations for restrictions regarding the COVID-19 Pandemic (e.g. social distancing) in an effort to limit this patient's exposure and mitigate transmission in our community. This patient is at least at moderate risk for complications without adequate follow up. This format is felt to be most appropriate for this patient at this time. Physical exam was limited by quality of the video and audio technology used for the visit.   Patient location: at work  Provider location: I, Beverely Pace Flinchum, FNP, have reviewed all documentation for this visit. The documentation on 08/09/19 for the exam, diagnosis, procedures, and orders are all accurate and complete.    Patient: Bonnie Shepard   DOB: January 14, 1985   34 y.o. Female  MRN: [id number] Visit Date: 08/09/2019  Today's healthcare provider: Jairo Ben, FNP   Chief Complaint  Patient presents with  . Lymphadenopathy   Subjective    HPI  Patient is a 34 year old female who requests a video visit during the COVID-19 pandemic for lymphadenopathy.  She reports she has had a swollen lymph node for the last 4 days in the back of her neck posterior cervical is where patient is pointing. She denies any known exposures or recent change in any medications.  She does have some fatigue but this is usual for her at times.  She denies sore throat.  She has had some sinus drainage postnasal drip sneezing. Denies any recent illness.   Does have appointment with GI for chronic issues, next Monday.   Requests Diflucan if yeast induced by antibiotics occurs.   Patient  denies any fever, body aches,chills, rash, chest pain, shortness of breath, nausea, vomiting, or diarrhea.  Denies dizziness, lightheadedness, pre syncopal or syncopal episodes.      Patient Active Problem List   Diagnosis Date Noted  . Fatigue 08/09/2019  .  Lymphadenopathy- posterior left side  08/09/2019  . Gastroesophageal reflux disease 07/21/2019  . History of nausea and vomiting 07/21/2019  . Epigastric burning sensation 07/21/2019  . Hematochezia   . Hemorrhoids, external, thrombosed 03/23/2019  . Rectal or anal pain 03/23/2019  . History of rectal bleeding 03/23/2019  . Trichomonas vaginitis 02/28/2019  . Vaginal high risk HPV DNA test positive 02/28/2019  . Anxiety with depression 02/23/2019  . Body mass index (BMI) of 40.1-44.9 in adult (HCC) 02/23/2019  . Pain of left heel 02/23/2019  . Cervical cancer screening 02/23/2019  . Morbid obesity (HCC) 02/21/2019  . Itching in the vaginal area 02/21/2019  . History of pregnancy loss in prior pregnancy, currently pregnant in second trimester 07/10/2016  . Incontinence overflow, stress female 12/03/2015  . Nausea 01/25/2008   Past Medical History:  Diagnosis Date  . Acid reflux   . Asthma   . Family history of adverse reaction to anesthesia    Mother - PONV  . Infection    freq UTI  . Vaginal Pap smear, abnormal    f/u wnl  . Wears contact lenses    Past Surgical History:  Procedure Laterality Date  . COLONOSCOPY WITH PROPOFOL N/A 04/03/2019   Procedure: COLONOSCOPY WITH PROPOFOL;  Surgeon: Midge Minium, MD;  Location: [facility];  Service: Endoscopy;  Laterality: N/A;  Priority 3  . DILATION AND EVACUATION N/A 09/07/2013   Procedure: DILATATION AND EVACUATION;  Surgeon: Freddrick March. Tenny Craw, MD;  Location: [facility];  Service: Gynecology;  Laterality: N/A;  .  TONSILLECTOMY     Social History   Tobacco Use  . Smoking status: Current Every Day Smoker    Packs/day: 0.25    Years: 16.00    Pack years: 4.00    Types: Cigarettes  . Smokeless tobacco: Never Used  . Tobacco comment: since age 11  Vaping Use  . Vaping Use: Never used  Substance Use Topics  . Alcohol use: No    Comment: occasionally  . Drug use: No   Social History   Socioeconomic History  . Marital  status: Single    Spouse name: Not on file  . Number of children: Not on file  . Years of education: Not on file  . Highest education level: Not on file  Occupational History  . Not on file  Tobacco Use  . Smoking status: Current Every Day Smoker    Packs/day: 0.25    Years: 16.00    Pack years: 4.00    Types: Cigarettes  . Smokeless tobacco: Never Used  . Tobacco comment: since age 37  Vaping Use  . Vaping Use: Never used  Substance and Sexual Activity  . Alcohol use: No    Comment: occasionally  . Drug use: No  . Sexual activity: Yes    Birth control/protection: None  Other Topics Concern  . Not on file  Social History Narrative  . Not on file   Social Determinants of Health   Financial Resource Strain:   . Difficulty of Paying Living Expenses:   Food Insecurity:   . Worried About Programme researcher, broadcasting/film/video in the Last Year:   . Barista in the Last Year:   Transportation Needs:   . Freight forwarder (Medical):   Marland Kitchen Lack of Transportation (Non-Medical):   Physical Activity:   . Days of Exercise per Week:   . Minutes of Exercise per Session:   Stress:   . Feeling of Stress :   Social Connections:   . Frequency of Communication with Friends and Family:   . Frequency of Social Gatherings with Friends and Family:   . Attends Religious Services:   . Active Member of Clubs or Organizations:   . Attends Banker Meetings:   Marland Kitchen Marital Status:   Intimate Partner Violence:   . Fear of Current or Ex-Partner:   . Emotionally Abused:   Marland Kitchen Physically Abused:   . Sexually Abused:    Family Status  Relation Name Status  . Other  (Not Specified)  . Mother  (Not Specified)  . Father  (Not Specified)  . MGM  (Not Specified)  . Neg Hx  (Not Specified)   Family History  Problem Relation Age of Onset  . Hypertension Other   . Cancer Other   . Hypertension Mother   . Hypertension Father   . Hypertension Maternal Grandmother   . Heart disease Maternal  Grandmother   . Hearing loss Neg Hx    No Known Allergies    Medications: Outpatient Medications Prior to Visit  Medication Sig  . cyclobenzaprine (FLEXERIL) 10 MG tablet Take 1 tablet (10 mg total) by mouth at bedtime.  Marland Kitchen escitalopram (LEXAPRO) 10 MG tablet Take 1 tablet (10 mg total) by mouth daily.  Marland Kitchen oxyCODONE-acetaminophen (PERCOCET) 5-325 MG tablet Take 1 tablet by mouth 2 (two) times daily as needed.  . pantoprazole (PROTONIX) 40 MG tablet Take 1 tablet (40 mg total) by mouth daily.  . promethazine (PHENERGAN) 25 MG tablet Take 1 tablet (25  mg total) by mouth every 4 (four) hours as needed for nausea or vomiting.  . hydrocortisone (ANUSOL-HC) 25 MG suppository Place 1 suppository (25 mg total) rectally 2 (two) times daily. (Patient not taking: Reported on 03/28/2019)   No facility-administered medications prior to visit.    Review of Systems  Constitutional: Positive for fatigue. Negative for activity change, appetite change, chills, diaphoresis, fever and unexpected weight change.  HENT: Positive for congestion, ear pain and sinus pressure. Negative for ear discharge, postnasal drip, rhinorrhea, sinus pain and tinnitus.   Respiratory: Negative.   Cardiovascular: Negative.   Gastrointestinal: Positive for nausea. Negative for abdominal distention, abdominal pain, anal bleeding, blood in stool, constipation, diarrhea, rectal pain and vomiting.  Musculoskeletal: Positive for neck pain. Negative for arthralgias, back pain, gait problem, joint swelling, myalgias and neck stiffness.  Neurological: Negative for dizziness, light-headedness and headaches.  Hematological: Positive for adenopathy.    Last CBC Lab Results  Component Value Date   WBC 7.1 07/22/2019   HGB 13.5 07/22/2019   HCT 39.3 07/22/2019   MCV 90.3 07/22/2019   MCH 31.0 07/22/2019   RDW 12.4 07/22/2019   PLT 185 07/22/2019   Last metabolic panel Lab Results  Component Value Date   GLUCOSE 106 (H) 07/22/2019    NA 137 07/22/2019   K 3.9 07/22/2019   CL 101 07/22/2019   CO2 26 07/22/2019   BUN 12 07/22/2019   CREATININE 0.69 07/22/2019   GFRNONAA >60 07/22/2019   GFRAA >60 07/22/2019   CALCIUM 9.2 07/22/2019   PROT 7.7 07/22/2019   ALBUMIN 4.3 07/22/2019   LABGLOB 2.6 01/20/2019   AGRATIO 1.8 01/20/2019   BILITOT 0.8 07/22/2019   ALKPHOS 74 07/22/2019   AST 44 (H) 07/22/2019   ALT 65 (H) 07/22/2019   ANIONGAP 10 07/22/2019   Last lipids Lab Results  Component Value Date   CHOL 181 01/20/2019   HDL 38 (L) 01/20/2019   LDLCALC 126 (H) 01/20/2019   TRIG 92 01/20/2019   Last hemoglobin A1c No results found for: HGBA1C Last thyroid functions Lab Results  Component Value Date   TSH 1.160 01/20/2019   Last vitamin D No results found for: 25OHVITD2, 25OHVITD3, VD25OH Last vitamin B12 and Folate No results found for: VITAMINB12, FOLATE    Objective    There were no vitals taken for this visit. BP Readings from Last 3 Encounters:  06/08/19 (!) 170/104  04/03/19 125/86  03/28/19 132/74      Physical Exam   Patient is alert and oriented and responsive to questions Engages in conversation with provider. Speaks in full sentences without any pauses without any shortness of breath or distress.     Assessment & Plan     The primary encounter diagnosis was Lymphadenopathy- posterior left side . A diagnosis of Fatigue, unspecified type was also pertinent to this visit.  Return in about 1 week (around 08/16/2019), or if symptoms worsen or fail to improve, for at any time for any worsening symptoms, Go to Emergency room/ urgent care if worse.    Meds ordered this encounter  Medications  . amoxicillin-clavulanate (AUGMENTIN) 875-125 MG tablet    Sig: Take 1 tablet by mouth 2 (two) times daily.    Dispense:  20 tablet    Refill:  0  . fluconazole (DIFLUCAN) 150 MG tablet    Sig: Take 1 tablet (150 mg total) by mouth once for 1 dose. If antibiotic yeast induced infection occurs  only if needed.  Dispense:  1 tablet    Refill:  0   Orders Placed This Encounter  Procedures  . CBC with Differential/Platelet  . Comprehensive Metabolic Panel (CMET)  . Mononucleosis Test, Qual W/ Reflex  low suspicion for mono, as she has chronic sinus drainage and has had infection in past. Discussed possibility of viral, will treat for possible bacterial infection since no office visit in person.  I discussed the assessment and treatment plan with the patient. Return to the office and report any new or worsening symptoms .   Red Flags discussed. The patient was given clear instructions to go to ER or return to medical center if any red flags develop, symptoms do not improve, worsen or new problems develop. They verbalized understanding.   The patient was provided an opportunity to ask questions and all were answered. The patient agreed with the plan and demonstrated an understanding of the instructions.   The patient was advised to call back or seek an in-person evaluation if the symptoms worsen or if the condition fails to improve as anticipated.  I discussed the limitations of evaluation and management by telemedicine and the availability of in person appointments. The patient expressed understanding and agreed to proceed.  I provided 20 minutes of non-face-to-face time during this encounter.  IBeverely Pace, Michelle Smith Flinchum, FNP, have reviewed all documentation for this visit. The documentation on 08/09/19 for the exam, diagnosis, procedures, and orders are all accurate and complete.   Jairo BenMichelle Smith Flinchum, FNP [facility] [phone] (phone) [phone] (fax)  [facility]

## 2019-08-09 NOTE — Progress Notes (Signed)
CBC within normal limits.

## 2019-08-09 NOTE — Progress Notes (Signed)
Mono negative.  Glucose elevated , likely not fasting.  ALT is still elevated slightly, AST has returned to normal.  Follow up as discussed at office visit today.

## 2019-08-14 ENCOUNTER — Other Ambulatory Visit: Payer: Self-pay

## 2019-08-14 ENCOUNTER — Encounter: Payer: Self-pay | Admitting: Gastroenterology

## 2019-08-14 ENCOUNTER — Ambulatory Visit (INDEPENDENT_AMBULATORY_CARE_PROVIDER_SITE_OTHER): Payer: No Typology Code available for payment source | Admitting: Gastroenterology

## 2019-08-14 DIAGNOSIS — R112 Nausea with vomiting, unspecified: Secondary | ICD-10-CM

## 2019-08-14 NOTE — Progress Notes (Signed)
Primary Care Physician: Berniece Pap, FNP  Primary Gastroenterologist:  Dr. Midge Minium  Chief Complaint  Patient presents with  . Nausea and vomiting    HPI: Bonnie Shepard is a 34 y.o. female here for nausea vomiting and cold sweats.  This patient had seen me in the past for rectal bleeding and a concern because of a family history of GI malignancies.  The patient had a colonoscopy that did not show any active bleeding or worrisome features.  The patient now has been having nausea vomiting cold sweats and made an appointment to come see me in the office today. The patient reports that she had two separate episodes of rectal bleeding that was bright red blood and also came with some nausea vomiting cold sweats.  This has subsided but the patient reports that she has had a few episodes of these attacks of nausea and vomiting without rectal bleeding.  She does not reported to be associated with any particular food.  She also denies any right upper quadrant pain.  The patient has not had her gallbladder out.  She states that since this has not happened in a few weeks she feels that she is doing better. The patient was on Protonix 20 mg a day and was increased to 40 mg a day.  She reports that her biggest issue now is nausea in the morning.  She takes her Protonix when she wakes up.  Past Medical History:  Diagnosis Date  . Acid reflux   . Asthma   . Family history of adverse reaction to anesthesia    Mother - PONV  . Infection    freq UTI  . Vaginal Pap smear, abnormal    f/u wnl  . Wears contact lenses     Current Outpatient Medications  Medication Sig Dispense Refill  . amoxicillin-clavulanate (AUGMENTIN) 875-125 MG tablet Take 1 tablet by mouth 2 (two) times daily. 20 tablet 0  . cyclobenzaprine (FLEXERIL) 10 MG tablet Take 1 tablet (10 mg total) by mouth at bedtime. 30 tablet 0  . escitalopram (LEXAPRO) 10 MG tablet Take 1 tablet (10 mg total) by mouth daily. 90 tablet 1  .  fluconazole (DIFLUCAN) 150 MG tablet Take 150 mg by mouth once.    . hydrocortisone (ANUSOL-HC) 25 MG suppository Place 1 suppository (25 mg total) rectally 2 (two) times daily. (Patient not taking: Reported on 03/28/2019) 12 suppository 0  . oxyCODONE-acetaminophen (PERCOCET) 5-325 MG tablet Take 1 tablet by mouth 2 (two) times daily as needed. 20 tablet 0  . pantoprazole (PROTONIX) 40 MG tablet Take 1 tablet (40 mg total) by mouth daily. 90 tablet 0  . promethazine (PHENERGAN) 25 MG tablet Take 1 tablet (25 mg total) by mouth every 4 (four) hours as needed for nausea or vomiting. 30 tablet 1   No current facility-administered medications for this visit.    Allergies as of 08/14/2019  . (No Known Allergies)    ROS:  General: Negative for anorexia, weight loss, fever, chills, fatigue, weakness. ENT: Negative for hoarseness, difficulty swallowing , nasal congestion. CV: Negative for chest pain, angina, palpitations, dyspnea on exertion, peripheral edema.  Respiratory: Negative for dyspnea at rest, dyspnea on exertion, cough, sputum, wheezing.  GI: See history of present illness. GU:  Negative for dysuria, hematuria, urinary incontinence, urinary frequency, nocturnal urination.  Endo: Negative for unusual weight change.    Physical Examination:   There were no vitals taken for this visit.  General: Well-nourished, well-developed in  no acute distress.  Eyes: No icterus. Conjunctivae pink. Lungs: Clear to auscultation bilaterally. Non-labored. Heart: Regular rate and rhythm, no murmurs rubs or gallops.  Abdomen: Bowel sounds are normal, nontender, nondistended, no hepatosplenomegaly or masses, no abdominal bruits or hernia , no rebound or guarding.   Extremities: No lower extremity edema. No clubbing or deformities. Neuro: Alert and oriented x 3.  Grossly intact. Skin: Warm and dry, no jaundice.   Psych: Alert and cooperative, normal mood and affect.  Labs:    Imaging Studies: No  results found.  Assessment and Plan:   Bonnie Shepard is a 34 y.o. y/o female who comes in today with a history of rectal bleeding and a colonoscopy that did not show anything to cause rectal bleeding and her bleeding was likely hemorrhoidal.  The patient now is concerned about morning nausea.  The patient has been told that she should take her Protonix in the evening so that she has more acid control while she is laying down thereby having likely more reflux.  The patient has been told the plan and agrees with it.  She will let me know if her symptoms do not improve.     Midge Minium, MD. Clementeen Graham    Note: This dictation was prepared with Dragon dictation along with smaller phrase technology. Any transcriptional errors that result from this process are unintentional.

## 2019-09-06 ENCOUNTER — Encounter: Payer: Self-pay | Admitting: Adult Health

## 2019-09-07 ENCOUNTER — Ambulatory Visit: Payer: No Typology Code available for payment source | Admitting: Physician Assistant

## 2019-09-08 ENCOUNTER — Encounter: Payer: Self-pay | Admitting: Adult Health

## 2019-09-12 NOTE — Telephone Encounter (Signed)
Please triage patient. No work note without seeing her.

## 2019-09-12 NOTE — Telephone Encounter (Signed)
Please advised if we can write a letter

## 2019-09-14 ENCOUNTER — Other Ambulatory Visit: Payer: Self-pay

## 2019-09-26 ENCOUNTER — Ambulatory Visit: Payer: No Typology Code available for payment source | Admitting: Adult Health

## 2019-10-10 ENCOUNTER — Encounter: Payer: Self-pay | Admitting: Adult Health

## 2019-10-10 ENCOUNTER — Other Ambulatory Visit: Payer: Self-pay

## 2019-10-10 ENCOUNTER — Ambulatory Visit (INDEPENDENT_AMBULATORY_CARE_PROVIDER_SITE_OTHER): Payer: No Typology Code available for payment source | Admitting: Adult Health

## 2019-10-10 ENCOUNTER — Other Ambulatory Visit: Payer: Self-pay | Admitting: Adult Health

## 2019-10-10 VITALS — BP 140/88 | HR 53 | Temp 97.8°F | Resp 15 | Wt 234.0 lb

## 2019-10-10 DIAGNOSIS — Z6841 Body Mass Index (BMI) 40.0 and over, adult: Secondary | ICD-10-CM | POA: Diagnosis not present

## 2019-10-10 DIAGNOSIS — R030 Elevated blood-pressure reading, without diagnosis of hypertension: Secondary | ICD-10-CM

## 2019-10-10 DIAGNOSIS — K219 Gastro-esophageal reflux disease without esophagitis: Secondary | ICD-10-CM

## 2019-10-10 DIAGNOSIS — Z8781 Personal history of (healed) traumatic fracture: Secondary | ICD-10-CM | POA: Insufficient documentation

## 2019-10-10 DIAGNOSIS — M79672 Pain in left foot: Secondary | ICD-10-CM

## 2019-10-10 DIAGNOSIS — F418 Other specified anxiety disorders: Secondary | ICD-10-CM

## 2019-10-10 MED ORDER — PANTOPRAZOLE SODIUM 40 MG PO TBEC: 40.0000 mg | DELAYED_RELEASE_TABLET | Freq: Every day | ORAL | 3 refills | Status: DC

## 2019-10-10 MED ORDER — ESCITALOPRAM OXALATE 5 MG PO TABS: 5.0000 mg | ORAL_TABLET | Freq: Every day | ORAL | 1 refills | Status: DC

## 2019-10-10 MED ORDER — ESCITALOPRAM OXALATE 10 MG PO TABS: 10.0000 mg | ORAL_TABLET | Freq: Every day | ORAL | 1 refills | Status: DC

## 2019-10-10 NOTE — Progress Notes (Signed)
Established patient visit   Patient: Bonnie Shepard   DOB: Dec 06, 1985   34 y.o. Female  MRN: [id number] Visit Date: 10/10/2019  Today's healthcare provider: Jairo BenMichelle Smith Jennamarie Goings, FNP   Chief Complaint  Patient presents with  . Anxiety  . Depression   Subjective    HPI  Depression/Anxiety, Follow-up  She  was last seen for this 6 months ago. Changes made at last visit include none patient continued on Lexapro 10mg .  She feels like she is doing well. She has seen some traumatic cases in the emergency room and this has caused an increase slightly in anxiety .She has no suicidal or homicidal ideations or intents.  She will trial an increase from Lexapro 1 0mg  to 15mg  and see if this helps.   She reports excellent compliance with treatment. She is not having side effects.   She reports excellent tolerance of treatment. Current symptoms include: none She feels she is Improved since last visit.   She is followed by Dr. Servando SnareWohl for stoma Stomach issues she has random vomiting. She has not scheduled an endoscopy but has been  Advised by gastroenterology and myself to do so.   She is taking the Protonix and needs refill.   She has left foot pain intermittent. She was wears plantar fasiatis splint. She plays softball..She gets a runner.  We will check another x ray and be sure she has not reinjured playing sports. 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   CLINICAL DATA:  Heel pain history of old fracture  EXAM: LEFT FOOT - COMPLETE 3+ VIEW  COMPARISON:  11/15/2016  FINDINGS: No acute displaced fracture or malalignment. Old fracture deformity at the proximal fifth metatarsal. Moderate plantar calcaneal spur  IMPRESSION: 1. Old fracture deformity proximal fifth metatarsal. 2. No acute osseous abnormality 3. Moderate calcaneal heel spur   Electronically Signed   By: Jasmine PangKim  Fujinaga M.D.   On:  02/21/2019 23:48  Patient  denies any fever, body aches,chills, rash, chest pain, shortness of breath, nausea, vomiting, or diarrhea.  Denies dizziness, lightheadedness, pre syncopal or syncopal episodes.     [facility] 10/10/2019 01/19/2019 12/03/2015  Decreased Interest 0 0 0  Down, Depressed, Hopeless 0 1 1  PHQ - 2 Score 0 1 1  Altered sleeping 0 3 0  Tired, decreased energy 2 3 2   Change in appetite 1 2 1   Feeling bad or failure about yourself  - 0 0  Trouble concentrating 0 0 2  Moving slowly or fidgety/restless 1 0 1  Suicidal thoughts 0 0 0  PHQ-9 Score 4 9 7   Difficult doing work/chores Not difficult at all Not difficult at all -    -----------------------------------------------------------------------------------------  Patient Active Problem List   Diagnosis Date Noted  . Fatigue 08/09/2019  . Lymphadenopathy- posterior left side  08/09/2019  . Gastroesophageal reflux disease 07/21/2019  . History of nausea and vomiting 07/21/2019  . Epigastric burning sensation 07/21/2019  . Hematochezia   . Hemorrhoids, external, thrombosed 03/23/2019  . Rectal or anal pain 03/23/2019  . History of rectal bleeding 03/23/2019  . Trichomonas vaginitis 02/28/2019  . Vaginal high risk HPV DNA test positive 02/28/2019  . Anxiety with depression 02/23/2019  . Body mass index (BMI) of 40.1-44.9 in adult (HCC) 02/23/2019  . Pain of left heel 02/23/2019  . Cervical cancer screening 02/23/2019  . Morbid obesity (HCC) 02/21/2019  .  Itching in the vaginal area 02/21/2019  . History of pregnancy loss in prior pregnancy, currently pregnant in second trimester 07/10/2016  . Incontinence overflow, stress female 12/03/2015  . Nausea 01/25/2008   Past Medical History:  Diagnosis Date  . Acid reflux   . Asthma   . Family history of adverse reaction to anesthesia    Mother - PONV  . Infection    freq UTI  . Vaginal Pap smear, abnormal    f/u wnl  . Wears contact lenses     Past Surgical History:  Procedure Laterality Date  . COLONOSCOPY WITH PROPOFOL N/A 04/03/2019   Procedure: COLONOSCOPY WITH PROPOFOL;  Surgeon: Midge Minium, MD;  Location: [facility];  Service: Endoscopy;  Laterality: N/A;  Priority 3  . DILATION AND EVACUATION N/A 09/07/2013   Procedure: DILATATION AND EVACUATION;  Surgeon: Freddrick March. Tenny Craw, MD;  Location: [facility];  Service: Gynecology;  Laterality: N/A;  . TONSILLECTOMY     Social History   Tobacco Use  . Smoking status: Current Every Day Smoker    Packs/day: 0.25    Years: 16.00    Pack years: 4.00    Types: Cigarettes  . Smokeless tobacco: Never Used  . Tobacco comment: since age 71  Vaping Use  . Vaping Use: Never used  Substance Use Topics  . Alcohol use: No    Comment: occasionally  . Drug use: No   Social History   Socioeconomic History  . Marital status: Single    Spouse name: Not on file  . Number of children: Not on file  . Years of education: Not on file  . Highest education level: Not on file  Occupational History  . Not on file  Tobacco Use  . Smoking status: Current Every Day Smoker    Packs/day: 0.25    Years: 16.00    Pack years: 4.00    Types: Cigarettes  . Smokeless tobacco: Never Used  . Tobacco comment: since age 16  Vaping Use  . Vaping Use: Never used  Substance and Sexual Activity  . Alcohol use: No    Comment: occasionally  . Drug use: No  . Sexual activity: Yes    Birth control/protection: None  Other Topics Concern  . Not on file  Social History Narrative  . Not on file   Social Determinants of Health   Financial Resource Strain:   . Difficulty of Paying Living Expenses: Not on file  Food Insecurity:   . Worried About Programme researcher, broadcasting/film/video in the Last Year: Not on file  . Ran Out of Food in the Last Year: Not on file  Transportation Needs:   . Lack of Transportation (Medical): Not on file  . Lack of Transportation (Non-Medical): Not on file  Physical Activity:   .  Days of Exercise per Week: Not on file  . Minutes of Exercise per Session: Not on file  Stress:   . Feeling of Stress : Not on file  Social Connections:   . Frequency of Communication with Friends and Family: Not on file  . Frequency of Social Gatherings with Friends and Family: Not on file  . Attends Religious Services: Not on file  . Active Member of Clubs or Organizations: Not on file  . Attends Banker Meetings: Not on file  . Marital Status: Not on file  Intimate Partner Violence:   . Fear of Current or Ex-Partner: Not on file  . Emotionally Abused: Not on file  .  Physically Abused: Not on file  . Sexually Abused: Not on file   Family Status  Relation Name Status  . Other  (Not Specified)  . Mother  (Not Specified)  . Father  (Not Specified)  . MGM  (Not Specified)  . Neg Hx  (Not Specified)   Family History  Problem Relation Age of Onset  . Hypertension Other   . Cancer Other   . Hypertension Mother   . Hypertension Father   . Hypertension Maternal Grandmother   . Heart disease Maternal Grandmother   . Hearing loss Neg Hx    No Known Allergies     Medications: Outpatient Medications Prior to Visit  Medication Sig  . cyclobenzaprine (FLEXERIL) 10 MG tablet Take 1 tablet (10 mg total) by mouth at bedtime.  . fluconazole (DIFLUCAN) 150 MG tablet Take 150 mg by mouth once.  . promethazine (PHENERGAN) 25 MG tablet Take 1 tablet (25 mg total) by mouth every 4 (four) hours as needed for nausea or vomiting.  . [DISCONTINUED] escitalopram (LEXAPRO) 10 MG tablet Take 1 tablet (10 mg total) by mouth daily.  . [DISCONTINUED] oxyCODONE-acetaminophen (PERCOCET) 5-325 MG tablet Take 1 tablet by mouth 2 (two) times daily as needed.  . [DISCONTINUED] pantoprazole (PROTONIX) 40 MG tablet Take 1 tablet (40 mg total) by mouth daily.  . hydrocortisone (ANUSOL-HC) 25 MG suppository Place 1 suppository (25 mg total) rectally 2 (two) times daily. (Patient not taking:  Reported on 03/28/2019)  . [DISCONTINUED] amoxicillin-clavulanate (AUGMENTIN) 875-125 MG tablet Take 1 tablet by mouth 2 (two) times daily.   No facility-administered medications prior to visit.    Review of Systems  Last CBC Lab Results  Component Value Date   WBC 8.2 08/09/2019   HGB 13.3 08/09/2019   HCT 39.5 08/09/2019   MCV 91.9 08/09/2019   MCH 30.9 08/09/2019   RDW 12.4 08/09/2019   PLT 219 08/09/2019   Last metabolic panel Lab Results  Component Value Date   GLUCOSE 112 (H) 08/09/2019   NA 137 08/09/2019   K 3.7 08/09/2019   CL 102 08/09/2019   CO2 25 08/09/2019   BUN 13 08/09/2019   CREATININE 0.96 08/09/2019   GFRNONAA >60 08/09/2019   GFRAA >60 08/09/2019   CALCIUM 8.9 08/09/2019   PROT 7.5 08/09/2019   ALBUMIN 4.4 08/09/2019   LABGLOB 2.6 01/20/2019   AGRATIO 1.8 01/20/2019   BILITOT 0.7 08/09/2019   ALKPHOS 85 08/09/2019   AST 35 08/09/2019   ALT 56 (H) 08/09/2019   ANIONGAP 10 08/09/2019   Last lipids Lab Results  Component Value Date   CHOL 181 01/20/2019   HDL 38 (L) 01/20/2019   LDLCALC 126 (H) 01/20/2019   TRIG 92 01/20/2019   Last hemoglobin A1c No results found for: HGBA1C Last thyroid functions Lab Results  Component Value Date   TSH 1.160 01/20/2019   Last vitamin D No results found for: 25OHVITD2, 25OHVITD3, VD25OH Last vitamin B12 and Folate No results found for: VITAMINB12, FOLATE    Objective    BP 140/88   Pulse (!) 53   Temp 97.8 F (36.6 C) (Oral)   Resp 15   Wt 234 lb (106.1 kg)   LMP 09/12/2019   SpO2 99%   BMI 41.45 kg/m  BP Readings from Last 3 Encounters:  10/10/19 140/88  06/08/19 (!) 170/104  04/03/19 125/86      Physical Exam Constitutional:      General: She is not in acute distress.  Appearance: Normal appearance. She is obese. She is not ill-appearing, toxic-appearing or diaphoretic.     Comments: Patient is alert and oriented and responsive to questions Engages in eye contact with provider.  Speaks in full sentences without any pauses without any shortness of breath or distress.    Eyes:     General: No scleral icterus.       Right eye: No discharge.        Left eye: No discharge.     Extraocular Movements: Extraocular movements intact.     Conjunctiva/sclera: Conjunctivae normal.     Pupils: Pupils are equal, round, and reactive to light.  Cardiovascular:     Rate and Rhythm: Normal rate and regular rhythm.     Pulses: Normal pulses.     Heart sounds: Normal heart sounds. No murmur heard.  No friction rub. No gallop.   Pulmonary:     Effort: Pulmonary effort is normal. No respiratory distress.     Breath sounds: Normal breath sounds. No stridor. No wheezing, rhonchi or rales.  Chest:     Chest wall: No tenderness.  Abdominal:     General: There is no distension.     Palpations: Abdomen is soft.     Tenderness: There is no abdominal tenderness.  Musculoskeletal:        General: Normal range of motion.     Right lower leg: No edema.     Left lower leg: No edema.     Comments: Normal left foot exam she has pain increased occasionally / intermittent while at work or playing sports.   Skin:    Capillary Refill: Capillary refill takes less than 2 seconds.     Findings: No erythema.  Neurological:     Mental Status: She is alert and oriented to person, place, and time.     Cranial Nerves: No cranial nerve deficit.     Motor: No weakness.     Gait: Gait normal.  Psychiatric:        Mood and Affect: Mood normal.        Behavior: Behavior normal.        Thought Content: Thought content normal.        Judgment: Judgment normal.      No results found for any visits on 10/10/19.  Assessment & Plan     1. Anxiety with depression Meds ordered this encounter  Medications  . escitalopram (LEXAPRO) 10 MG tablet    Sig: Take 1 tablet (10 mg total) by mouth daily.    Dispense:  90 tablet    Refill:  1  . escitalopram (LEXAPRO) 5 MG tablet    Sig: Take 1 tablet (5 mg  total) by mouth daily.    Dispense:  90 tablet    Refill:  1  . pantoprazole (PROTONIX) 40 MG tablet    Sig: Take 1 tablet (40 mg total) by mouth daily.    Dispense:  90 tablet    Refill:  3   Total of Lexapro 15 mg by mouth once qd.  Call if not improving at anytime.    Discussed known black box warning for anti depression/ anxiety medication. Need to report any behavioral changes right, if any homicidal or suicidal thoughts or ideas seek medical attention right away. Call 911.   2. Body mass index (BMI) of 40.1-44.9 in adult [facility]) The patient is advised to quit smoking, begin progressive daily aerobic exercise program, follow a low fat, low cholesterol diet, attempt  to lose weight, decrease or avoid alcohol intake, reduce salt in diet and cooking, reduce exposure to stress, attend health education classes for weight loss., improve dietary compliance, use calcium 1 gram daily with Vit D, continue current medications, continue current healthy lifestyle patterns and return for routine annual checkups.   3. Gastroesophageal reflux disease, unspecified whether esophagitis present - pantoprazole (PROTONIX) 40 MG tablet; Take 1 tablet (40 mg total) by mouth daily.  Dispense: 90 tablet; Refill: 3  4. Epigastric burning sensation Refilled, do advise to see GI Dr. Servando Snare for recommended upper endoscopy.  - pantoprazole (PROTONIX) 40 MG tablet; Take 1 tablet (40 mg total) by mouth daily.  Dispense: 90 tablet; Refill: 3  5. Left foot pain  - DG Foot Complete Left - Ambulatory referral to Podiatry  6. History of foot fracture- left  Orders Placed This Encounter  Procedures  . DG Foot Complete Left    Order Specific Question:   Reason for Exam (SYMPTOM  OR DIAGNOSIS REQUIRED)    Answer:   history of old fracture see last and still has pain.    Order Specific Question:   Is patient pregnant?    Answer:   No    Order Specific Question:   Preferred imaging location?    Answer:   [facility]  . Ambulatory referral to Podiatry    Referral Priority:   Urgent    Referral Type:   Consultation    Referral Reason:   Specialty Services Required    Referred to Provider:   Felecia Shelling, DPM    Requested Specialty:   Podiatry    Number of Visits Requested:   1    7. Elevated blood pressure reading  Diet and lifestyle advised. Keep log return if elevated sooner.    Return in about 6 months (around 04/09/2020), or if symptoms worsen or fail to improve.      Red Flags discussed. The patient was given clear instructions to go to ER or return to medical center if any red flags develop, symptoms do not improve, worsen or new problems develop. They verbalized understanding.    Jairo Ben, FNP  [facility] [phone] (phone) [phone] (fax)  [facility]

## 2019-10-10 NOTE — Patient Instructions (Addendum)
Increase Lexapro to 15mg  by mouth daily.  Advised patient call the office or your primary care doctor for an appointment if no improvement within 72 hours or if any symptoms change or worsen at any time  Advised ER or urgent Care if after hours or on weekend. Call 911 for emergency symptoms at any time.Patinet verbalized understanding of all instructions given/reviewed and treatment plan and has no further questions or concerns at this time.    Orders Placed This Encounter  Procedures  . DG Foot Complete Left  . Ambulatory referral to Podiatry     Escitalopram tablets What is this medicine? ESCITALOPRAM (es sye TAL oh pram) is used to treat depression and certain types of anxiety. This medicine may be used for other purposes; ask your health care provider or pharmacist if you have questions. COMMON BRAND NAME(S): Lexapro What should I tell my health care provider before I take this medicine? They need to know if you have any of these conditions:  bipolar disorder or a family history of bipolar disorder  diabetes  glaucoma  heart disease  kidney or liver disease  receiving electroconvulsive therapy  seizures (convulsions)  suicidal thoughts, plans, or attempt by you or a family member  an unusual or allergic reaction to escitalopram, the related drug citalopram, other medicines, foods, dyes, or preservatives  pregnant or trying to become pregnant  breast-feeding How should I use this medicine? Take this medicine by mouth with a glass of water. Follow the directions on the prescription label. You can take it with or without food. If it upsets your stomach, take it with food. Take your medicine at regular intervals. Do not take it more often than directed. Do not stop taking this medicine suddenly except upon the advice of your doctor. Stopping this medicine too quickly may cause serious side effects or your condition may worsen. A special MedGuide will be given to you by the  pharmacist with each prescription and refill. Be sure to read this information carefully each time. Talk to your pediatrician regarding the use of this medicine in children. Special care may be needed. Overdosage: If you think you have taken too much of this medicine contact a poison control center or emergency room at once. NOTE: This medicine is only for you. Do not share this medicine with others. What if I miss a dose? If you miss a dose, take it as soon as you can. If it is almost time for your next dose, take only that dose. Do not take double or extra doses. What may interact with this medicine? Do not take this medicine with any of the following medications:  certain medicines for fungal infections like fluconazole, itraconazole, ketoconazole, posaconazole, voriconazole  cisapride  citalopram  dronedarone  linezolid  MAOIs like Carbex, Eldepryl, Marplan, Nardil, and Parnate  methylene blue (injected into a vein)  pimozide  thioridazine This medicine may also interact with the following medications:  alcohol  amphetamines  aspirin and aspirin-like medicines  carbamazepine  certain medicines for depression, anxiety, or psychotic disturbances  certain medicines for migraine headache like almotriptan, eletriptan, frovatriptan, naratriptan, rizatriptan, sumatriptan, zolmitriptan  certain medicines for sleep  certain medicines that treat or prevent blood clots like warfarin, enoxaparin, dalteparin  cimetidine  diuretics  dofetilide  fentanyl  furazolidone  isoniazid  lithium  metoprolol  NSAIDs, medicines for pain and inflammation, like ibuprofen or naproxen  other medicines that prolong the QT interval (cause an abnormal heart rhythm)  procarbazine  rasagiline  supplements like St. John's wort, kava kava, valerian  tramadol  tryptophan  ziprasidone This list may not describe all possible interactions. Give your health care provider a list  of all the medicines, herbs, non-prescription drugs, or dietary supplements you use. Also tell them if you smoke, drink alcohol, or use illegal drugs. Some items may interact with your medicine. What should I watch for while using this medicine? Tell your doctor if your symptoms do not get better or if they get worse. Visit your doctor or health care professional for regular checks on your progress. Because it may take several weeks to see the full effects of this medicine, it is important to continue your treatment as prescribed by your doctor. Patients and their families should watch out for new or worsening thoughts of suicide or depression. Also watch out for sudden changes in feelings such as feeling anxious, agitated, panicky, irritable, hostile, aggressive, impulsive, severely restless, overly excited and hyperactive, or not being able to sleep. If this happens, especially at the beginning of treatment or after a change in dose, call your health care professional. Bonita Quin may get drowsy or dizzy. Do not drive, use machinery, or do anything that needs mental alertness until you know how this medicine affects you. Do not stand or sit up quickly, especially if you are an older patient. This reduces the risk of dizzy or fainting spells. Alcohol may interfere with the effect of this medicine. Avoid alcoholic drinks. Your mouth may get dry. Chewing sugarless gum or sucking hard candy, and drinking plenty of water may help. Contact your doctor if the problem does not go away or is severe. What side effects may I notice from receiving this medicine? Side effects that you should report to your doctor or health care professional as soon as possible:  allergic reactions like skin rash, itching or hives, swelling of the face, lips, or tongue  anxious  black, tarry stools  changes in vision  confusion  elevated mood, decreased need for sleep, racing thoughts, impulsive behavior  eye pain  fast,  irregular heartbeat  feeling faint or lightheaded, falls  feeling agitated, angry, or irritable  hallucination, loss of contact with reality  loss of balance or coordination  loss of memory  painful or prolonged erections  restlessness, pacing, inability to keep still  seizures  stiff muscles  suicidal thoughts or other mood changes  trouble sleeping  unusual bleeding or bruising  unusually weak or tired  vomiting Side effects that usually do not require medical attention (report to your doctor or health care professional if they continue or are bothersome):  changes in appetite  change in sex drive or performance  headache  increased sweating  indigestion, nausea  tremors This list may not describe all possible side effects. Call your doctor for medical advice about side effects. You may report side effects to FDA at 1-800-FDA-1088. Where should I keep my medicine? Keep out of reach of children. Store at room temperature between 15 and 30 degrees C (59 and 86 degrees F). Throw away any unused medicine after the expiration date. NOTE: This sheet is a summary. It may not cover all possible information. If you have questions about this medicine, talk to your doctor, pharmacist, or health care provider.  2020 Elsevier/Gold Standard (2017-12-13 11:21:44)

## 2019-10-17 ENCOUNTER — Ambulatory Visit
Admission: RE | Admit: 2019-10-17 | Discharge: 2019-10-17 | Disposition: A | Discharge status: Home / Self Care | Payer: No Typology Code available for payment source | Attending: Adult Health | Admitting: Adult Health

## 2019-10-17 ENCOUNTER — Other Ambulatory Visit: Payer: Self-pay

## 2019-10-17 ENCOUNTER — Ambulatory Visit
Admission: RE | Admit: 2019-10-17 | Discharge: 2019-10-17 | Disposition: A | Discharge status: Home / Self Care | Payer: No Typology Code available for payment source | Source: Ambulatory Visit | Attending: Adult Health | Admitting: Adult Health

## 2019-10-17 DIAGNOSIS — M79672 Pain in left foot: Secondary | ICD-10-CM | POA: Diagnosis not present

## 2019-10-18 ENCOUNTER — Encounter: Payer: Self-pay | Admitting: Adult Health

## 2019-10-18 NOTE — Progress Notes (Signed)
Stress fracture of  fourth metatarsal with healing.  Chronic stress fracture healing on fifth metatarsal.  Recommend you go to [facility] walk in 10/19/2019 for further work up, may need boot or casting for healing. Could have re injured with sports given pain intermittent and worsening with activity. [facility] has walk in clinic from 1 pm to 7 pm.

## 2019-10-19 ENCOUNTER — Encounter: Payer: Self-pay | Admitting: Podiatry

## 2019-10-19 ENCOUNTER — Other Ambulatory Visit: Payer: Self-pay

## 2019-10-19 ENCOUNTER — Ambulatory Visit: Payer: No Typology Code available for payment source | Admitting: Podiatry

## 2019-10-19 DIAGNOSIS — M722 Plantar fascial fibromatosis: Secondary | ICD-10-CM | POA: Diagnosis not present

## 2019-10-19 DIAGNOSIS — M84375A Stress fracture, left foot, initial encounter for fracture: Secondary | ICD-10-CM

## 2019-10-20 ENCOUNTER — Encounter: Payer: Self-pay | Admitting: Podiatry

## 2019-10-20 NOTE — Progress Notes (Signed)
Subjective:  Patient ID: Bonnie Shepard, female    DOB: 06/27/1985,  MRN: [id number]  Chief Complaint  Patient presents with  . Foot Pain    Patient presents today for left lateral side foot and heel pain.  She says the pain radiates into her ankle at times    34 y.o. female presents with the above complaint.  Patient presents with primary complaint of left lateral foot pain that has been going on for past 6 months.  Patient states that has been painful to walk on.  She states that she walks about 1 to 2 hours a day but is most painful after that.  There is some sharp pain associated with it.  She has secondary complaint of left heel pain as well as experiencing post static dyskinesia type of symptoms.  She has tried night splint cam boot Tylenol ibuprofen but none of that has helped.  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  She would like to discuss treatment options for this.  She has not seen any specialist beside me.  She had x-rays done at her primary care office.   Review of Systems: Negative except as noted in the HPI. Denies N/V/F/Ch.  Past Medical History:  Diagnosis Date  . Acid reflux   . Asthma   . Family history of adverse reaction to anesthesia    Mother - PONV  . Infection    freq UTI  . Vaginal Pap smear, abnormal    f/u wnl  . Wears contact lenses     Current Outpatient Medications:  .  cyclobenzaprine (FLEXERIL) 10 MG tablet, Take 1 tablet (10 mg total) by mouth at bedtime., Disp: 30 tablet, Rfl: 0 .  escitalopram (LEXAPRO) 10 MG tablet, Take 1 tablet (10 mg total) by mouth daily., Disp: 90 tablet, Rfl: 1 .  escitalopram (LEXAPRO) 5 MG tablet, Take 1 tablet (5 mg total) by mouth daily., Disp: 90 tablet, Rfl: 1 .  hydrocortisone (ANUSOL-HC) 25 MG suppository, Place 1 suppository (25 mg total) rectally 2 (two) times daily. (Patient not taking: Reported on 03/28/2019), Disp: 12 suppository, Rfl: 0 .  pantoprazole (PROTONIX) 40 MG tablet,  Take 1 tablet (40 mg total) by mouth daily., Disp: 90 tablet, Rfl: 3 .  promethazine (PHENERGAN) 25 MG tablet, Take 1 tablet (25 mg total) by mouth every 4 (four) hours as needed for nausea or vomiting., Disp: 30 tablet, Rfl: 1  Social History   Tobacco Use  Smoking Status Current Every Day Smoker  . Packs/day: 0.25  . Years: 16.00  . Pack years: 4.00  . Types: Cigarettes  Smokeless Tobacco Never Used  Tobacco Comment   since age 6    No Known Allergies Objective:  There were no vitals filed for this visit. There is no height or weight on file to calculate BMI. Constitutional Well developed. Well nourished.  Vascular Dorsalis pedis pulses palpable bilaterally. Posterior tibial pulses palpable bilaterally. Capillary refill normal to all digits.  No cyanosis or clubbing noted. Pedal hair growth normal.  Neurologic Normal speech. Oriented to person, place, and time. Epicritic sensation to light touch grossly present bilaterally.  Dermatologic Nails well groomed and normal in appearance. No open wounds. No skin lesions.  Orthopedic: Normal joint ROM without pain or crepitus bilaterally. No visible deformities. Tender to palpation at the calcaneal tuber left. No pain with calcaneal squeeze left. Ankle ROM diminished range of motion left. Silfverskiold Test: positive left.  Pain on palpation  to the left fourth metatarsal base.  Positive piano key stress test.  No pain with dorsiflexion of the digit resisted.  No pain with plantarflexion of the digits resisted.   Radiographs: There again noted changes consistent with prior fifth metatarsal fracture with healing similar to that seen on the prior exam. Better visualized on today's exam is an apparent stress fracture with healing in the proximal aspect of the fourth metatarsal. No other fractures are seen. No soft tissue abnormality is noted. Calcaneal spurring is seen.  IMPRESSION: Better visualized on today's exam is a  healing stress fracture in the proximal fourth metatarsal.  Chronic fifth metatarsal fracture with healing.  Assessment:   1. Stress fracture, left foot, initial encounter for fracture   2. Plantar fasciitis of left foot    Plan:  Patient was evaluated and treated and all questions answered.  Plantar Fasciitis, left - XR reviewed as above.  - Educated on icing and stretching. Instructions given.  - Injection delivered to the plantar fascia as below. - DME: I will hold off on the plantar fascial brace.  We'll do a cam boot immobilization given the stress fracture - Pharmacologic management: None  Left fourth metatarsal stress fracture -I explained to the patient the etiology of stress fracture various treatment options were discussed.  Given that there is bone callus formation I believe this is a hypertrophic fracture with adequate vascularity therefore will continue cam boot immobilization to allow it to properly heal.  Patient agrees with the plan. -Patient already has a cam boot she will place her self back in it.  This will also indirectly help with the plantar fasciitis. -If there is no improvement will consider a bone stimulator  Procedure: Injection Tendon/Ligament Location: Left plantar fascia at the glabrous junction; medial approach. Skin Prep: alcohol Injectate: 0.5 cc 0.5% marcaine plain, 0.5 cc of 1% Lidocaine, 0.5 cc kenalog 10. Disposition: Patient tolerated procedure well. Injection site dressed with a band-aid.  No follow-ups on file.

## 2019-10-24 ENCOUNTER — Encounter: Payer: Self-pay | Admitting: Podiatry

## 2019-10-27 ENCOUNTER — Encounter: Payer: Self-pay | Admitting: Podiatry

## 2019-11-10 DIAGNOSIS — M79676 Pain in unspecified toe(s): Secondary | ICD-10-CM

## 2019-11-16 ENCOUNTER — Ambulatory Visit (INDEPENDENT_AMBULATORY_CARE_PROVIDER_SITE_OTHER): Payer: No Typology Code available for payment source

## 2019-11-16 ENCOUNTER — Encounter: Payer: Self-pay | Admitting: Podiatry

## 2019-11-16 ENCOUNTER — Ambulatory Visit (INDEPENDENT_AMBULATORY_CARE_PROVIDER_SITE_OTHER): Payer: No Typology Code available for payment source | Admitting: Podiatry

## 2019-11-16 ENCOUNTER — Other Ambulatory Visit: Payer: Self-pay

## 2019-11-16 DIAGNOSIS — M84375A Stress fracture, left foot, initial encounter for fracture: Secondary | ICD-10-CM

## 2019-11-16 DIAGNOSIS — M722 Plantar fascial fibromatosis: Secondary | ICD-10-CM

## 2019-11-17 ENCOUNTER — Encounter: Payer: Self-pay | Admitting: Podiatry

## 2019-11-17 ENCOUNTER — Encounter: Payer: Self-pay | Admitting: *Deleted

## 2019-11-17 NOTE — Progress Notes (Signed)
Subjective:  Patient ID: Bonnie Shepard, female    DOB: 1985/02/13,  MRN: [id number]  Chief Complaint  Patient presents with  . Fracture    "its doing better"    34 y.o. female presents with the above complaint.  Patient presents with follow-up of stress fracture to the left fourth metatarsal.  Patient states that it is no longer painful.  She no longer has pain when ambulating with it.  She still has pain in the heel consistent with plantar fasciitis.  The injection helped for a little bit of the pain came back.  The cam boot immobilization did not help too much either.  She denies any other acute complaints.  She would like to discuss treatment options for plantar fasciitis now to the left side.  Review of Systems: Negative except as noted in the HPI. Denies N/V/F/Ch.  Past Medical History:  Diagnosis Date  . Acid reflux   . Asthma   . Family history of adverse reaction to anesthesia    Mother - PONV  . Infection    freq UTI  . Vaginal Pap smear, abnormal    f/u wnl  . Wears contact lenses     Current Outpatient Medications:  .  cyclobenzaprine (FLEXERIL) 10 MG tablet, Take 1 tablet (10 mg total) by mouth at bedtime., Disp: 30 tablet, Rfl: 0 .  escitalopram (LEXAPRO) 10 MG tablet, Take 1 tablet (10 mg total) by mouth daily., Disp: 90 tablet, Rfl: 1 .  escitalopram (LEXAPRO) 5 MG tablet, Take 1 tablet (5 mg total) by mouth daily., Disp: 90 tablet, Rfl: 1 .  hydrocortisone (ANUSOL-HC) 25 MG suppository, Place 1 suppository (25 mg total) rectally 2 (two) times daily. (Patient not taking: Reported on 03/28/2019), Disp: 12 suppository, Rfl: 0 .  pantoprazole (PROTONIX) 40 MG tablet, Take 1 tablet (40 mg total) by mouth daily., Disp: 90 tablet, Rfl: 3 .  promethazine (PHENERGAN) 25 MG tablet, Take 1 tablet (25 mg total) by mouth every 4 (four) hours as needed for nausea or vomiting., Disp: 30 tablet, Rfl: 1  Social History   Tobacco Use  Smoking Status Current Every Day Smoker  .  Packs/day: 0.25  . Years: 16.00  . Pack years: 4.00  . Types: Cigarettes  Smokeless Tobacco Never Used  Tobacco Comment   since age 7    No Known Allergies Objective:  There were no vitals filed for this visit. There is no height or weight on file to calculate BMI. Constitutional Well developed. Well nourished.  Vascular Dorsalis pedis pulses palpable bilaterally. Posterior tibial pulses palpable bilaterally. Capillary refill normal to all digits.  No cyanosis or clubbing noted. Pedal hair growth normal.  Neurologic Normal speech. Oriented to person, place, and time. Epicritic sensation to light touch grossly present bilaterally.  Dermatologic Nails well groomed and normal in appearance. No open wounds. No skin lesions.  Orthopedic: Normal joint ROM without pain or crepitus bilaterally. No visible deformities. Tender to palpation at the calcaneal tuber left. No pain with calcaneal squeeze left. Ankle ROM diminished range of motion left. Silfverskiold Test: positive left.  Pain on palpation to the left fourth metatarsal base.  Positive piano key stress test.  No pain with dorsiflexion of the digit resisted.  No pain with plantarflexion of the digits resisted.   Radiographs: There again noted changes consistent with prior fifth metatarsal fracture with healing similar to that seen on the prior exam. Better visualized on today's exam is an apparent stress fracture with healing in the  proximal aspect of the fourth metatarsal. No other fractures are seen. No soft tissue abnormality is noted. Calcaneal spurring is seen.  IMPRESSION: Better visualized on today's exam is a healing stress fracture in the proximal fourth metatarsal.  Chronic fifth metatarsal fracture with healing.  Assessment:   1. Stress fracture, left foot, initial encounter for fracture   2. Plantar fasciitis of left foot    Plan:  Patient was evaluated and treated and all questions  answered.  Plantar Fasciitis, left - XR reviewed as above.  - Educated on icing and stretching. Instructions given.  -Second injection delivered to the plantar fascia as below. - DME: Plantar fascial brace - Pharmacologic management: None  Left fourth metatarsal stress fracture Clinically patient's pain has completely improved.  She no longer has any pain on palpation to the base of the fourth metatarsal.  At this time I discussed with the patient that the osseous healing can take many months for her to heal.  As long as her clinical pain is resolved we will let the bone heal itself.  Patient agrees with the plan.  If there is no improvement in the pain recurs we will discuss bone stimulator  Procedure: Injection Tendon/Ligament Location: Left plantar fascia at the glabrous junction; medial approach. Skin Prep: alcohol Injectate: 0.5 cc 0.5% marcaine plain, 0.5 cc of 1% Lidocaine, 0.5 cc kenalog 10. Disposition: Patient tolerated procedure well. Injection site dressed with a band-aid.  No follow-ups on file.

## 2019-12-14 ENCOUNTER — Ambulatory Visit (INDEPENDENT_AMBULATORY_CARE_PROVIDER_SITE_OTHER): Payer: No Typology Code available for payment source | Admitting: Podiatry

## 2019-12-14 ENCOUNTER — Other Ambulatory Visit: Payer: Self-pay | Admitting: Podiatry

## 2019-12-14 ENCOUNTER — Ambulatory Visit: Payer: No Typology Code available for payment source

## 2019-12-14 ENCOUNTER — Encounter: Payer: Self-pay | Admitting: Podiatry

## 2019-12-14 ENCOUNTER — Other Ambulatory Visit: Payer: Self-pay

## 2019-12-14 ENCOUNTER — Ambulatory Visit (INDEPENDENT_AMBULATORY_CARE_PROVIDER_SITE_OTHER): Payer: No Typology Code available for payment source

## 2019-12-14 DIAGNOSIS — M84375A Stress fracture, left foot, initial encounter for fracture: Secondary | ICD-10-CM | POA: Diagnosis not present

## 2019-12-14 DIAGNOSIS — M722 Plantar fascial fibromatosis: Secondary | ICD-10-CM | POA: Diagnosis not present

## 2019-12-15 ENCOUNTER — Other Ambulatory Visit: Payer: Self-pay | Admitting: Student

## 2019-12-15 ENCOUNTER — Encounter: Payer: Self-pay | Admitting: Podiatry

## 2019-12-15 ENCOUNTER — Encounter: Payer: Self-pay | Admitting: Adult Health

## 2019-12-15 DIAGNOSIS — M722 Plantar fascial fibromatosis: Secondary | ICD-10-CM | POA: Diagnosis not present

## 2019-12-15 MED ORDER — TRIAMCINOLONE ACETONIDE 10 MG/ML IJ SUSP
10.0000 mg | Freq: Once | INTRAMUSCULAR | Status: AC
  Administered 2019-12-15: 10 mg via INTRA_ARTICULAR

## 2019-12-15 NOTE — Progress Notes (Signed)
Subjective:  Patient ID: Bonnie Shepard, female    DOB: Oct 30, 1985,  MRN: [id number]  Chief Complaint  Patient presents with  . Fracture    "my left foot is feeling better, but my right heel has started hurting about 2 weeks ago"    34 y.o. female presents with the above complaint.  Patient presents with a follow-up from stress fracture to the left moderate fourth metatarsal.  She does not have any further pain from that.  She states her left plantar fascia is also doing much better she is about 70% improved from previous injection.  She has been wearing her brace which has helped.  She states her right foot has also started hurting about 2 weeks ago.  She would like to do steroid injection to the right heel as the plantar fascia is acting up.  She would also like a brace for it.  She denies any other acute complaints.  Review of Systems: Negative except as noted in the HPI. Denies N/V/F/Ch.  Past Medical History:  Diagnosis Date  . Acid reflux   . Asthma   . Family history of adverse reaction to anesthesia    Mother - PONV  . Infection    freq UTI  . Vaginal Pap smear, abnormal    f/u wnl  . Wears contact lenses     Current Outpatient Medications:  .  cyclobenzaprine (FLEXERIL) 10 MG tablet, Take 1 tablet (10 mg total) by mouth at bedtime., Disp: 30 tablet, Rfl: 0 .  escitalopram (LEXAPRO) 10 MG tablet, Take 1 tablet (10 mg total) by mouth daily., Disp: 90 tablet, Rfl: 1 .  escitalopram (LEXAPRO) 5 MG tablet, Take 1 tablet (5 mg total) by mouth daily., Disp: 90 tablet, Rfl: 1 .  hydrocortisone (ANUSOL-HC) 25 MG suppository, Place 1 suppository (25 mg total) rectally 2 (two) times daily. (Patient not taking: Reported on 03/28/2019), Disp: 12 suppository, Rfl: 0 .  pantoprazole (PROTONIX) 40 MG tablet, Take 1 tablet (40 mg total) by mouth daily., Disp: 90 tablet, Rfl: 3 .  promethazine (PHENERGAN) 25 MG tablet, Take 1 tablet (25 mg total) by mouth every 4 (four) hours as needed for nausea  or vomiting., Disp: 30 tablet, Rfl: 1  Social History   Tobacco Use  Smoking Status Current Every Day Smoker  . Packs/day: 0.25  . Years: 16.00  . Pack years: 4.00  . Types: Cigarettes  Smokeless Tobacco Never Used  Tobacco Comment   since age 16    No Known Allergies Objective:  There were no vitals filed for this visit. There is no height or weight on file to calculate BMI. Constitutional Well developed. Well nourished.  Vascular Dorsalis pedis pulses palpable bilaterally. Posterior tibial pulses palpable bilaterally. Capillary refill normal to all digits.  No cyanosis or clubbing noted. Pedal hair growth normal.  Neurologic Normal speech. Oriented to person, place, and time. Epicritic sensation to light touch grossly present bilaterally.  Dermatologic Nails well groomed and normal in appearance. No open wounds. No skin lesions.  Orthopedic: Normal joint ROM without pain or crepitus bilaterally. No visible deformities. Tender to palpation at the calcaneal tuber bilateral No pain with calcaneal squeeze left. Ankle ROM diminished range of motion bilateral Silfverskiold Test: positive bilateral  No pain on palpation to the left fourth metatarsal base.  Positive piano key stress test.  No pain with dorsiflexion of the digit resisted.  No pain with plantarflexion of the digits resisted.   Radiographs: There again noted changes consistent with  prior fifth metatarsal fracture with healing similar to that seen on the prior exam. Better visualized on today's exam is an apparent stress fracture with healing in the proximal aspect of the fourth metatarsal. No other fractures are seen. No soft tissue abnormality is noted. Calcaneal spurring is seen.  IMPRESSION: Better visualized on today's exam is a continued healing stress fracture in the proximal fourth metatarsal with callus formation  Chronic fifth metatarsal fracture with healing.  Assessment:   1. Stress  fracture, left foot, initial encounter for fracture   2. Plantar fasciitis of right foot   3. Plantar fasciitis of left foot    Plan:  Patient was evaluated and treated and all questions answered.  Plantar Fasciitis, right - XR reviewed as above.  - Educated on icing and stretching. Instructions given.  -First injection delivered to the plantar fascia as below. - DME: Plantar fascial brace - Pharmacologic management: None  Plantar Fasciitis, left - XR reviewed as above.  - Educated on icing and stretching. Instructions given.  -Second injection delivered to the plantar fascia as below. - DME: Plantar fascial brace - Pharmacologic management: None  Left fourth metatarsal stress fracture Clinically patient's pain has completely improved.  She no longer has any pain on palpation to the base of the fourth metatarsal.  At this time I discussed with the patient that the osseous healing can take many months for her to heal.  As long as her clinical pain is resolved we will let the bone heal itself.  Patient agrees with the plan.  If there is no improvement in the pain recurs we will discuss bone stimulator  Procedure: Injection Tendon/Ligament Location: Left plantar fascia at the glabrous junction; medial approach. Skin Prep: alcohol Injectate: 0.5 cc 0.5% marcaine plain, 0.5 cc of 1% Lidocaine, 0.5 cc kenalog 10. Disposition: Patient tolerated procedure well. Injection site dressed with a band-aid.  No follow-ups on file.

## 2019-12-27 ENCOUNTER — Other Ambulatory Visit: Payer: No Typology Code available for payment source | Admitting: Orthotics

## 2020-01-11 ENCOUNTER — Other Ambulatory Visit: Payer: Self-pay

## 2020-01-11 ENCOUNTER — Encounter: Payer: Self-pay | Admitting: Podiatry

## 2020-01-11 ENCOUNTER — Ambulatory Visit (INDEPENDENT_AMBULATORY_CARE_PROVIDER_SITE_OTHER): Payer: No Typology Code available for payment source

## 2020-01-11 ENCOUNTER — Ambulatory Visit (INDEPENDENT_AMBULATORY_CARE_PROVIDER_SITE_OTHER): Payer: No Typology Code available for payment source | Admitting: Podiatry

## 2020-01-11 DIAGNOSIS — M84375A Stress fracture, left foot, initial encounter for fracture: Secondary | ICD-10-CM

## 2020-01-11 DIAGNOSIS — M722 Plantar fascial fibromatosis: Secondary | ICD-10-CM | POA: Diagnosis not present

## 2020-01-11 DIAGNOSIS — Q666 Other congenital valgus deformities of feet: Secondary | ICD-10-CM | POA: Diagnosis not present

## 2020-01-14 ENCOUNTER — Encounter: Payer: Self-pay | Admitting: Podiatry

## 2020-01-14 NOTE — Progress Notes (Signed)
Subjective:  Patient ID: Bonnie Shepard, female    DOB: [birth year],  MRN: [id number]  Chief Complaint  Patient presents with  . Fracture    "its doing a lot better, its just a little stiffness in the mornings but gets better after I walk around a few minutes"    35 y.o. female presents with the above complaint.  Patient presents with follow-up of bilateral plantar fasciitis.  She states she is about 95 to 100% removed.  She is interested in orthotics.  She has been wearing her bracing.  She states overall she is doing a lot better.  She denies any other acute complaints.  Review of Systems: Negative except as noted in the HPI. Denies N/V/F/Ch.  Past Medical History:  Diagnosis Date  . Acid reflux   . Asthma   . Family history of adverse reaction to anesthesia    Mother - PONV  . Infection    freq UTI  . Vaginal Pap smear, abnormal    f/u wnl  . Wears contact lenses     Current Outpatient Medications:  .  cyclobenzaprine (FLEXERIL) 10 MG tablet, Take 1 tablet (10 mg total) by mouth at bedtime., Disp: 30 tablet, Rfl: 0 .  escitalopram (LEXAPRO) 10 MG tablet, Take 1 tablet (10 mg total) by mouth daily., Disp: 90 tablet, Rfl: 1 .  escitalopram (LEXAPRO) 5 MG tablet, Take 1 tablet (5 mg total) by mouth daily., Disp: 90 tablet, Rfl: 1 .  hydrocortisone (ANUSOL-HC) 25 MG suppository, Place 1 suppository (25 mg total) rectally 2 (two) times daily. (Patient not taking: Reported on 03/28/2019), Disp: 12 suppository, Rfl: 0 .  pantoprazole (PROTONIX) 40 MG tablet, Take 1 tablet (40 mg total) by mouth daily., Disp: 90 tablet, Rfl: 3 .  promethazine (PHENERGAN) 25 MG tablet, Take 1 tablet (25 mg total) by mouth every 4 (four) hours as needed for nausea or vomiting., Disp: 30 tablet, Rfl: 1  Social History   Tobacco Use  Smoking Status Current Every Day Smoker  . Packs/day: 0.25  . Years: 16.00  . Pack years: 4.00  . Types: Cigarettes  Smokeless Tobacco Never Used  Tobacco Comment   since  age 41    No Known Allergies Objective:  There were no vitals filed for this visit. There is no height or weight on file to calculate BMI. Constitutional Well developed. Well nourished.  Vascular Dorsalis pedis pulses palpable bilaterally. Posterior tibial pulses palpable bilaterally. Capillary refill normal to all digits.  No cyanosis or clubbing noted. Pedal hair growth normal.  Neurologic Normal speech. Oriented to person, place, and time. Epicritic sensation to light touch grossly present bilaterally.  Dermatologic Nails well groomed and normal in appearance. No open wounds. No skin lesions.  Orthopedic: Normal joint ROM without pain or crepitus bilaterally. No visible deformities. Tender to palpation at the calcaneal tuber bilateral No pain with calcaneal squeeze left. Ankle ROM diminished range of motion bilateral Silfverskiold Test: positive bilateral  No pain on palpation to the left fourth metatarsal base.  Positive piano key stress test.  No pain with dorsiflexion of the digit resisted.  No pain with plantarflexion of the digits resisted.   Radiographs: There again noted changes consistent with prior fifth metatarsal fracture with healing similar to that seen on the prior exam. Better visualized on today's exam is an apparent stress fracture with healing in the proximal aspect of the fourth metatarsal. No other fractures are seen. No soft tissue abnormality is noted. Calcaneal spurring is seen.  IMPRESSION: Better visualized on today's exam is a continued healing stress fracture in the proximal fourth metatarsal with callus formation  Chronic fifth metatarsal fracture with healing.  Assessment:   1. Stress fracture, left foot, initial encounter for fracture   2. Plantar fasciitis of right foot   3. Plantar fasciitis of left foot   4. Pes planovalgus    Plan:  Patient was evaluated and treated and all questions answered.  Plantar Fasciitis,  right -Clinically healed same as below  Plantar Fasciitis, left -Clinically healed.  Continue wearing plantar fascial braces until obtaining orthotics after the orthotics patient can officially discontinue plantar fascial braces.  I discussed with her this in excessive detail.  If any foot and ankle issues arise in future come back to see me.  She states understanding  Left fourth metatarsal stress fracture Clinically patient's pain has completely improved.  She no longer has any pain on palpation to the base of the fourth metatarsal.  At this time I discussed with the patient that the osseous healing can take many months for her to heal.  As long as her clinical pain is resolved we will let the bone heal itself.  Patient agrees with the plan.  If there is no improvement in the pain recurs we will discuss bone stimulator  Pes planovalgus - The patient etiology of pes planovalgus understood and options were discussed.  Given that patient is doing a lot better she will ultimately benefit from custom-made orthotics to help control the ankle motion support the arch of the foot and therefore take distress only from the plantar fasciitis.  Patient agrees with the plan like to obtain orthotics -She will be scheduled to see Raiford Noble for orthotics  No follow-ups on file.

## 2020-01-15 ENCOUNTER — Telehealth: Payer: Self-pay | Admitting: *Deleted

## 2020-01-15 ENCOUNTER — Telehealth (INDEPENDENT_AMBULATORY_CARE_PROVIDER_SITE_OTHER): Payer: No Typology Code available for payment source | Admitting: Physician Assistant

## 2020-01-15 ENCOUNTER — Encounter: Payer: Self-pay | Admitting: Adult Health

## 2020-01-15 DIAGNOSIS — R112 Nausea with vomiting, unspecified: Secondary | ICD-10-CM

## 2020-01-15 DIAGNOSIS — Z20822 Contact with and (suspected) exposure to covid-19: Secondary | ICD-10-CM

## 2020-01-15 MED ORDER — BENZONATATE 200 MG PO CAPS: 200.0000 mg | ORAL_CAPSULE | Freq: Three times a day (TID) | ORAL | 0 refills | Status: DC | PRN

## 2020-01-15 MED ORDER — FLUTICASONE PROPIONATE 50 MCG/ACT NA SUSP: 2.0000 | Freq: Every day | NASAL | 6 refills | Status: DC

## 2020-01-15 MED ORDER — ONDANSETRON 8 MG PO TBDP: 8.0000 mg | ORAL_TABLET | Freq: Three times a day (TID) | ORAL | 0 refills | Status: DC | PRN

## 2020-01-15 MED ORDER — ALBUTEROL SULFATE HFA 108 (90 BASE) MCG/ACT IN AERS: 2.0000 | INHALATION_SPRAY | Freq: Four times a day (QID) | RESPIRATORY_TRACT | 0 refills | Status: DC | PRN

## 2020-01-15 NOTE — Telephone Encounter (Signed)
Per Dr. Allena Katz, I attempted to call the patient to schedule her an appointment for orthotics.  I want to schedule her for 10/16/2020.  I left her a message to call me back.

## 2020-01-15 NOTE — Progress Notes (Signed)
MyChart Video Visit    Virtual Visit via Video Note   This visit type was conducted due to national recommendations for restrictions regarding the COVID-19 Pandemic (e.g. social distancing) in an effort to limit this patient's exposure and mitigate transmission in our community. This patient is at least at moderate risk for complications without adequate follow up. This format is felt to be most appropriate for this patient at this time. Physical exam was limited by quality of the video and audio technology used for the visit.   Interactive audio and video communications were attempted, although failed due to patient's inability to connect to video. Continued visit with audio only interaction with patient agreement.  Patient location: Home Provider location: [place]  I discussed the limitations of evaluation and management by telemedicine and the availability of in person appointments. The patient expressed understanding and agreed to proceed.  Patient: Bonnie Shepard   DOB: [birth year]   35 y.o. Female  MRN: [id number] Visit Date: 01/15/2020  Today's healthcare provider: Margaretann Loveless, PA-C   No chief complaint on file.  Subjective    HPI  Upper respiratory symptoms She complains of bilateral ear pressure/pain, fever since yesterday and nasal congestion.with fever to 103 degrees Fahrenheit. Pt is also experiencing vomiting, nausea. Onset of symptoms was yesterday and staying constant.She is drinking plenty of fluids.  Past history is significant for asthma. Patient is smoker.  Pt reports potentially having a sinus infection before the onset of diarrhea, vomiting, and congestion x 2 weeks. Did have covid testing done today, but does not have results. ---------------------------------------------------------------------------------------------------   Patient Active Problem List   Diagnosis Date Noted  . History of foot fracture- left  10/10/2019  . Fatigue  08/09/2019  . Lymphadenopathy- posterior left side  08/09/2019  . Gastroesophageal reflux disease 07/21/2019  . History of nausea and vomiting 07/21/2019  . Epigastric burning sensation 07/21/2019  . Hematochezia   . Hemorrhoids, external, thrombosed 03/23/2019  . Rectal or anal pain 03/23/2019  . History of rectal bleeding 03/23/2019  . Trichomonas vaginitis 02/28/2019  . Vaginal high risk HPV DNA test positive 02/28/2019  . Anxiety with depression 02/23/2019  . Body mass index (BMI) of 40.1-44.9 in adult (HCC) 02/23/2019  . Left foot pain 02/23/2019  . Cervical cancer screening 02/23/2019  . Morbid obesity (HCC) 02/21/2019  . Itching in the vaginal area 02/21/2019  . History of pregnancy loss in prior pregnancy, currently pregnant in second trimester 07/10/2016  . Incontinence overflow, stress female 12/03/2015  . Nausea 01/25/2008   Past Medical History:  Diagnosis Date  . Acid reflux   . Asthma   . Family history of adverse reaction to anesthesia    Mother - PONV  . Infection    freq UTI  . Vaginal Pap smear, abnormal    f/u wnl  . Wears contact lenses    Family History  Problem Relation Age of Onset  . Hypertension Other   . Cancer Other   . Hypertension Mother   . Hypertension Father   . Hypertension Maternal Grandmother   . Heart disease Maternal Grandmother   . Hearing loss Neg Hx    No Known Allergies    Medications: Outpatient Medications Prior to Visit  Medication Sig  . cyclobenzaprine (FLEXERIL) 10 MG tablet Take 1 tablet (10 mg total) by mouth at bedtime.  Marland Kitchen escitalopram (LEXAPRO) 10 MG tablet Take 1 tablet (10 mg total) by mouth daily.  Marland Kitchen escitalopram (LEXAPRO) 5 MG  tablet Take 1 tablet (5 mg total) by mouth daily.  . hydrocortisone (ANUSOL-HC) 25 MG suppository Place 1 suppository (25 mg total) rectally 2 (two) times daily. (Patient not taking: Reported on 03/28/2019)  . pantoprazole (PROTONIX) 40 MG tablet Take 1 tablet (40 mg total) by mouth  daily.  . promethazine (PHENERGAN) 25 MG tablet Take 1 tablet (25 mg total) by mouth every 4 (four) hours as needed for nausea or vomiting.   No facility-administered medications prior to visit.    Review of Systems  Constitutional: Positive for chills, fatigue and fever.  HENT: Positive for congestion, ear pain, rhinorrhea, sinus pressure and sinus pain.   Respiratory: Positive for cough, chest tightness and shortness of breath. Negative for wheezing and stridor.     Last CBC Lab Results  Component Value Date   WBC 8.2 08/09/2019   HGB 13.3 08/09/2019   HCT 39.5 08/09/2019   MCV 91.9 08/09/2019   MCH 30.9 08/09/2019   RDW 12.4 08/09/2019   PLT 219 08/09/2019   Last metabolic panel Lab Results  Component Value Date   GLUCOSE 112 (H) 08/09/2019   NA 137 08/09/2019   K 3.7 08/09/2019   CL 102 08/09/2019   CO2 25 08/09/2019   BUN 13 08/09/2019   CREATININE 0.96 08/09/2019   GFRNONAA >60 08/09/2019   GFRAA >60 08/09/2019   CALCIUM 8.9 08/09/2019   PROT 7.5 08/09/2019   ALBUMIN 4.4 08/09/2019   LABGLOB 2.6 01/20/2019   AGRATIO 1.8 01/20/2019   BILITOT 0.7 08/09/2019   ALKPHOS 85 08/09/2019   AST 35 08/09/2019   ALT 56 (H) 08/09/2019   ANIONGAP 10 08/09/2019      Objective    There were no vitals taken for this visit. BP Readings from Last 3 Encounters:  10/10/19 140/88  06/08/19 (!) 170/104  04/03/19 125/86   Wt Readings from Last 3 Encounters:  10/10/19 234 lb (106.1 kg)  06/08/19 230 lb (104.3 kg)  04/03/19 235 lb (106.6 kg)      Physical Exam Pulmonary:     Effort: No respiratory distress.        Assessment & Plan     1. Suspected COVID-19 virus infection Suspected viral URI, symptoms consistent with covid 19. Awaiting test results. Will give albuterol for PRN SOB or wheezing. Tessalon perles for cough. Flonase for nasal congestion. May continue OTC symptomatic management of choice, mucinex recommended for congestion. Tylenol and IBU can be  alternated as needed for fevers and body aches. Call if worsening or not improving.  - benzonatate (TESSALON) 200 MG capsule; Take 1 capsule (200 mg total) by mouth 3 (three) times daily as needed.  Dispense: 30 capsule; Refill: 0 - albuterol (VENTOLIN HFA) 108 (90 Base) MCG/ACT inhaler; Inhale 2 puffs into the lungs every 6 (six) hours as needed for wheezing or shortness of breath.  Dispense: 8 g; Refill: 0 - fluticasone (FLONASE) 50 MCG/ACT nasal spray; Place 2 sprays into both nostrils daily.  Dispense: 16 g; Refill: 6  2. Non-intractable vomiting with nausea, unspecified vomiting type Nausea associated with symptoms and medications. Has tried phenergan without relief. Will give zofran as below. Call if worsening.  - ondansetron (ZOFRAN ODT) 8 MG disintegrating tablet; Take 1 tablet (8 mg total) by mouth every 8 (eight) hours as needed for nausea or vomiting.  Dispense: 20 tablet; Refill: 0   No follow-ups on file.     I discussed the assessment and treatment plan with the patient. The patient was provided  an opportunity to ask questions and all were answered. The patient agreed with the plan and demonstrated an understanding of the instructions.   The patient was advised to call back or seek an in-person evaluation if the symptoms worsen or if the condition fails to improve as anticipated.  I provided 13 minutes of non-face-to-face time during this encounter.  Delmer Islam, PA-C, have reviewed all documentation for this visit. The documentation on 01/16/20 for the exam, diagnosis, procedures, and orders are all accurate and complete.  Reine Just [facility] [phone] (phone) [phone] (fax)  [facility]

## 2020-01-15 NOTE — Telephone Encounter (Signed)
-----   Message from Candelaria Stagers, DPM sent at 01/14/2020  1:43 PM EST ----- Regarding: Make appointment with me for orthotics Hi Shanekqua Schaper,  Can you make an appointment for this patient to see rick for orthotics.  She was supposed to make it on the way out but I guess she must of forgotten to tell you.  Thank you Caryn Bee

## 2020-01-16 ENCOUNTER — Encounter: Payer: Self-pay | Admitting: Physician Assistant

## 2020-01-16 NOTE — Patient Instructions (Signed)

## 2020-01-17 ENCOUNTER — Other Ambulatory Visit: Payer: Self-pay

## 2020-01-17 ENCOUNTER — Observation Stay
Admission: EM | Admit: 2020-01-17 | Discharge: 2020-01-18 | Disposition: A | Discharge status: Home / Self Care | Payer: No Typology Code available for payment source | Attending: Internal Medicine | Admitting: Internal Medicine

## 2020-01-17 ENCOUNTER — Emergency Department: Payer: No Typology Code available for payment source

## 2020-01-17 ENCOUNTER — Encounter: Payer: Self-pay | Admitting: Emergency Medicine

## 2020-01-17 DIAGNOSIS — R109 Unspecified abdominal pain: Secondary | ICD-10-CM | POA: Insufficient documentation

## 2020-01-17 DIAGNOSIS — R112 Nausea with vomiting, unspecified: Principal | ICD-10-CM | POA: Diagnosis present

## 2020-01-17 DIAGNOSIS — F418 Other specified anxiety disorders: Secondary | ICD-10-CM | POA: Diagnosis not present

## 2020-01-17 DIAGNOSIS — K219 Gastro-esophageal reflux disease without esophagitis: Secondary | ICD-10-CM | POA: Diagnosis not present

## 2020-01-17 DIAGNOSIS — Z6841 Body Mass Index (BMI) 40.0 and over, adult: Secondary | ICD-10-CM

## 2020-01-17 DIAGNOSIS — Z20822 Contact with and (suspected) exposure to covid-19: Secondary | ICD-10-CM | POA: Insufficient documentation

## 2020-01-17 DIAGNOSIS — H6691 Otitis media, unspecified, right ear: Secondary | ICD-10-CM | POA: Insufficient documentation

## 2020-01-17 DIAGNOSIS — F17210 Nicotine dependence, cigarettes, uncomplicated: Secondary | ICD-10-CM | POA: Insufficient documentation

## 2020-01-17 DIAGNOSIS — B349 Viral infection, unspecified: Secondary | ICD-10-CM | POA: Insufficient documentation

## 2020-01-17 DIAGNOSIS — J45909 Unspecified asthma, uncomplicated: Secondary | ICD-10-CM | POA: Insufficient documentation

## 2020-01-17 DIAGNOSIS — R110 Nausea: Secondary | ICD-10-CM | POA: Diagnosis not present

## 2020-01-17 LAB — RESP PANEL BY RT-PCR (FLU A&B, COVID) ARPGX2
Influenza A by PCR: NEGATIVE
Influenza B by PCR: NEGATIVE
SARS Coronavirus 2 by RT PCR: NEGATIVE

## 2020-01-17 LAB — CBC
HCT: 42.3 % (ref 36.0–46.0)
Hemoglobin: 14.9 g/dL (ref 12.0–15.0)
MCH: 31 pg (ref 26.0–34.0)
MCHC: 35.2 g/dL (ref 30.0–36.0)
MCV: 87.9 fL (ref 80.0–100.0)
Platelets: 323 10*3/uL (ref 150–400)
RBC: 4.81 MIL/uL (ref 3.87–5.11)
RDW: 12.1 % (ref 11.5–15.5)
WBC: 10.4 10*3/uL (ref 4.0–10.5)
nRBC: 0 % (ref 0.0–0.2)

## 2020-01-17 LAB — URINALYSIS, COMPLETE (UACMP) WITH MICROSCOPIC
Bacteria, UA: NONE SEEN
Bilirubin Urine: NEGATIVE
Glucose, UA: >=500 mg/dL — AB
Ketones, ur: 5 mg/dL — AB
Leukocytes,Ua: NEGATIVE
Nitrite: NEGATIVE
Protein, ur: 30 mg/dL — AB
Specific Gravity, Urine: 1.016 (ref 1.005–1.030)
pH: 7 (ref 5.0–8.0)

## 2020-01-17 LAB — HEMOGLOBIN A1C
Hgb A1c MFr Bld: 5.4 % (ref 4.8–5.6)
Mean Plasma Glucose: 108.28 mg/dL

## 2020-01-17 LAB — COMPREHENSIVE METABOLIC PANEL
ALT: 52 U/L — ABNORMAL HIGH (ref 0–44)
AST: 36 U/L (ref 15–41)
Albumin: 3.9 g/dL (ref 3.5–5.0)
Alkaline Phosphatase: 97 U/L (ref 38–126)
Anion gap: 17 — ABNORMAL HIGH (ref 5–15)
BUN: 22 mg/dL — ABNORMAL HIGH (ref 6–20)
CO2: 19 mmol/L — ABNORMAL LOW (ref 22–32)
Calcium: 9.4 mg/dL (ref 8.9–10.3)
Chloride: 100 mmol/L (ref 98–111)
Creatinine, Ser: 0.89 mg/dL (ref 0.44–1.00)
GFR, Estimated: >60 mL/min (ref >60)
Glucose, Bld: 228 mg/dL — ABNORMAL HIGH (ref 70–99)
Potassium: 3 mmol/L — ABNORMAL LOW (ref 3.5–5.1)
Sodium: 136 mmol/L (ref 135–145)
Total Bilirubin: 0.8 mg/dL (ref 0.3–1.2)
Total Protein: 8.2 g/dL — ABNORMAL HIGH (ref 6.5–8.1)

## 2020-01-17 LAB — POC SARS CORONAVIRUS 2 AG -  ED: SARS Coronavirus 2 Ag: NEGATIVE

## 2020-01-17 LAB — POC URINE PREG, ED: Preg Test, Ur: NEGATIVE

## 2020-01-17 LAB — BETA-HYDROXYBUTYRIC ACID: Beta-Hydroxybutyric Acid: 0.15 mmol/L (ref 0.05–0.27)

## 2020-01-17 LAB — LIPASE, BLOOD: Lipase: 25 U/L (ref 11–51)

## 2020-01-17 LAB — HCG, QUANTITATIVE, PREGNANCY: hCG, Beta Chain, Quant, S: <1 m[IU]/mL (ref <5)

## 2020-01-17 LAB — TSH: TSH: 0.752 u[IU]/mL (ref 0.350–4.500)

## 2020-01-17 MED ORDER — FAMOTIDINE 20 MG PO TABS
40.0000 mg | ORAL_TABLET | Freq: Once | ORAL | Status: AC
  Administered 2020-01-17: 40 mg via ORAL
  Filled 2020-01-17: qty 2

## 2020-01-17 MED ORDER — ONDANSETRON HCL 4 MG/2ML IJ SOLN
4.0000 mg | Freq: Four times a day (QID) | INTRAMUSCULAR | Status: DC | PRN
  Administered 2020-01-18: 4 mg via INTRAVENOUS
  Filled 2020-01-17: qty 2

## 2020-01-17 MED ORDER — IOHEXOL 300 MG/ML  SOLN
100.0000 mL | Freq: Once | INTRAMUSCULAR | Status: AC | PRN
  Administered 2020-01-17: 100 mL via INTRAVENOUS
  Filled 2020-01-17: qty 100

## 2020-01-17 MED ORDER — ESCITALOPRAM OXALATE 10 MG PO TABS: 10.0000 mg | ORAL_TABLET | Freq: Every day | ORAL | Status: DC

## 2020-01-17 MED ORDER — LACTATED RINGERS IV BOLUS
1000.0000 mL | Freq: Once | INTRAVENOUS | Status: AC
  Administered 2020-01-17: 1000 mL via INTRAVENOUS

## 2020-01-17 MED ORDER — ACETAMINOPHEN 325 MG PO TABS: 325.0000 mg | ORAL_TABLET | Freq: Four times a day (QID) | ORAL | Status: DC | PRN

## 2020-01-17 MED ORDER — ESCITALOPRAM OXALATE 10 MG PO TABS
15.0000 mg | ORAL_TABLET | Freq: Every day | ORAL | Status: DC
Start: 2020-01-17 — End: 2020-01-18
  Administered 2020-01-17 – 2020-01-18 (×2): 15 mg via ORAL
  Filled 2020-01-17 (×2): qty 2

## 2020-01-17 MED ORDER — PANTOPRAZOLE SODIUM 40 MG IV SOLR
40.0000 mg | Freq: Once | INTRAVENOUS | Status: AC
  Administered 2020-01-17: 40 mg via INTRAVENOUS
  Filled 2020-01-17: qty 40

## 2020-01-17 MED ORDER — ONDANSETRON HCL 4 MG PO TABS: 4.0000 mg | ORAL_TABLET | Freq: Four times a day (QID) | ORAL | Status: DC | PRN

## 2020-01-17 MED ORDER — SODIUM CHLORIDE 0.9 % IV SOLN
1.0000 g | Freq: Once | INTRAVENOUS | Status: AC
  Administered 2020-01-17: 1 g via INTRAVENOUS
  Filled 2020-01-17: qty 10

## 2020-01-17 MED ORDER — POTASSIUM CITRATE-CITRIC ACID 1100-334 MG/5ML PO SOLN
40.0000 meq | Freq: Two times a day (BID) | ORAL | Status: DC
  Filled 2020-01-17 (×3): qty 20

## 2020-01-17 MED ORDER — ACETAMINOPHEN 650 MG RE SUPP: 650.0000 mg | Freq: Four times a day (QID) | RECTAL | Status: DC | PRN

## 2020-01-17 MED ORDER — ONDANSETRON HCL 4 MG/2ML IJ SOLN
4.0000 mg | Freq: Once | INTRAMUSCULAR | Status: AC
  Administered 2020-01-17: 4 mg via INTRAVENOUS
  Filled 2020-01-17 (×2): qty 2

## 2020-01-17 MED ORDER — KETOROLAC TROMETHAMINE 30 MG/ML IJ SOLN
15.0000 mg | INTRAMUSCULAR | Status: AC
  Administered 2020-01-17: 15 mg via INTRAVENOUS
  Filled 2020-01-17: qty 1

## 2020-01-17 MED ORDER — ONDANSETRON 4 MG PO TBDP
4.0000 mg | ORAL_TABLET | Freq: Once | ORAL | Status: AC | PRN
  Administered 2020-01-17: 4 mg via ORAL
  Filled 2020-01-17: qty 1

## 2020-01-17 MED ORDER — ALUM & MAG HYDROXIDE-SIMETH 200-200-20 MG/5ML PO SUSP
30.0000 mL | Freq: Once | ORAL | Status: AC
  Administered 2020-01-17: 30 mL via ORAL
  Filled 2020-01-17: qty 30

## 2020-01-17 MED ORDER — ENOXAPARIN SODIUM 60 MG/0.6ML ~~LOC~~ SOLN
0.5000 mg/kg | SUBCUTANEOUS | Status: DC
  Administered 2020-01-17: 55 mg via SUBCUTANEOUS
  Filled 2020-01-17: qty 0.6

## 2020-01-17 MED ORDER — ALBUTEROL SULFATE HFA 108 (90 BASE) MCG/ACT IN AERS
2.0000 | INHALATION_SPRAY | Freq: Four times a day (QID) | RESPIRATORY_TRACT | Status: DC | PRN
  Filled 2020-01-17: qty 6.7

## 2020-01-17 MED ORDER — ONDANSETRON HCL 4 MG/2ML IJ SOLN: 4.0000 mg | Freq: Once | INTRAMUSCULAR | Status: AC | PRN

## 2020-01-17 MED ORDER — CYCLOBENZAPRINE HCL 10 MG PO TABS
10.0000 mg | ORAL_TABLET | Freq: Every day | ORAL | Status: DC
  Administered 2020-01-17: 10 mg via ORAL
  Filled 2020-01-17: qty 1

## 2020-01-17 MED ORDER — ESCITALOPRAM OXALATE 10 MG PO TABS: 5.0000 mg | ORAL_TABLET | Freq: Every day | ORAL | Status: DC

## 2020-01-17 MED ORDER — AMOXICILLIN-POT CLAVULANATE 875-125 MG PO TABS
1.0000 | ORAL_TABLET | Freq: Two times a day (BID) | ORAL | Status: DC
  Administered 2020-01-18: 1 via ORAL
  Filled 2020-01-17 (×2): qty 1

## 2020-01-17 MED ORDER — BENZONATATE 100 MG PO CAPS: 200.0000 mg | ORAL_CAPSULE | Freq: Three times a day (TID) | ORAL | Status: DC | PRN

## 2020-01-17 MED ORDER — ONDANSETRON HCL 4 MG/2ML IJ SOLN
INTRAMUSCULAR | Status: AC
  Administered 2020-01-17: 4 mg via INTRAVENOUS
  Filled 2020-01-17: qty 2

## 2020-01-17 MED ORDER — PANTOPRAZOLE SODIUM 40 MG PO TBEC
40.0000 mg | DELAYED_RELEASE_TABLET | Freq: Every day | ORAL | Status: DC
  Administered 2020-01-17 – 2020-01-18 (×2): 40 mg via ORAL
  Filled 2020-01-17 (×2): qty 1

## 2020-01-17 NOTE — ED Provider Notes (Signed)
[facility] Emergency Department Provider Note  ____________________________________________  Time seen: Approximately 3:15 PM  I have reviewed the triage vital signs and the nursing notes.   HISTORY  Chief Complaint Nausea and Vomiting    HPI Bonnie Shepard is a 35 y.o. female with a history of GERD who comes ED complaining of shortness of breath, nausea and vomiting for the past 2 or 3 days.  No chest pain, no abdominal pain.  No fever but she does have fatigue and body aches.  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  Symptoms are constant, waxing waning, no aggravating or alleviating factors      Past Medical History:  Diagnosis Date  . Acid reflux   . Asthma   . Family history of adverse reaction to anesthesia    Mother - PONV  . Infection    freq UTI  . Vaginal Pap smear, abnormal    f/u wnl  . Wears contact lenses      Patient Active Problem List   Diagnosis Date Noted  . History of foot fracture- left  10/10/2019  . Fatigue 08/09/2019  . Lymphadenopathy- posterior left side  08/09/2019  . Gastroesophageal reflux disease 07/21/2019  . History of nausea and vomiting 07/21/2019  . Epigastric burning sensation 07/21/2019  . Hematochezia   . Hemorrhoids, external, thrombosed 03/23/2019  . Rectal or anal pain 03/23/2019  . History of rectal bleeding 03/23/2019  . Trichomonas vaginitis 02/28/2019  . Vaginal high risk HPV DNA test positive 02/28/2019  . Anxiety with depression 02/23/2019  . Body mass index (BMI) of 40.1-44.9 in adult (HCC) 02/23/2019  . Left foot pain 02/23/2019  . Cervical cancer screening 02/23/2019  . Morbid obesity (HCC) 02/21/2019  . Itching in the vaginal area 02/21/2019  . History of pregnancy loss in prior pregnancy, currently pregnant in second trimester 07/10/2016  . Incontinence overflow, stress female 12/03/2015  . Nausea 01/25/2008     Past Surgical History:  Procedure Laterality Date  .  COLONOSCOPY WITH PROPOFOL N/A 04/03/2019   Procedure: COLONOSCOPY WITH PROPOFOL;  Surgeon: Midge Minium, MD;  Location: [facility];  Service: Endoscopy;  Laterality: N/A;  Priority 3  . DILATION AND EVACUATION N/A 09/07/2013   Procedure: DILATATION AND EVACUATION;  Surgeon: Freddrick March. Tenny Craw, MD;  Location: [facility];  Service: Gynecology;  Laterality: N/A;  . TONSILLECTOMY       Prior to Admission medications   Medication Sig Start Date End Date Taking? Authorizing Provider  albuterol (VENTOLIN HFA) 108 (90 Base) MCG/ACT inhaler Inhale 2 puffs into the lungs every 6 (six) hours as needed for wheezing or shortness of breath. 01/15/20   Margaretann Loveless, PA-C  benzonatate (TESSALON) 200 MG capsule Take 1 capsule (200 mg total) by mouth 3 (three) times daily as needed. 01/15/20   Margaretann Loveless, PA-C  cyclobenzaprine (FLEXERIL) 10 MG tablet Take 1 tablet (10 mg total) by mouth at bedtime. 01/19/19   Flinchum, Eula Fried, FNP  escitalopram (LEXAPRO) 10 MG tablet Take 1 tablet (10 mg total) by mouth daily. 10/10/19   Flinchum, Eula Fried, FNP  escitalopram (LEXAPRO) 5 MG tablet Take 1 tablet (5 mg total) by mouth daily. 10/10/19   Flinchum, Eula Fried, FNP  fluticasone (FLONASE) 50 MCG/ACT nasal spray Place 2 sprays into both nostrils daily. 01/15/20   Margaretann Loveless, PA-C  hydrocortisone (ANUSOL-HC) 25 MG suppository Place 1 suppository (25 mg total) rectally 2 (two) times daily. 03/23/19   Flinchum,  Eula Fried, FNP  ondansetron (ZOFRAN ODT) 8 MG disintegrating tablet Take 1 tablet (8 mg total) by mouth every 8 (eight) hours as needed for nausea or vomiting. 01/15/20   Margaretann Loveless, PA-C  pantoprazole (PROTONIX) 40 MG tablet Take 1 tablet (40 mg total) by mouth daily. 10/10/19   Flinchum, Eula Fried, FNP  promethazine (PHENERGAN) 25 MG tablet Take 1 tablet (25 mg total) by mouth every 4 (four) hours as needed for nausea or vomiting. 06/08/19   Emily Filbert, MD      Allergies Patient has no known allergies.   Family History  Problem Relation Age of Onset  . Hypertension Other   . Cancer Other   . Hypertension Mother   . Hypertension Father   . Hypertension Maternal Grandmother   . Heart disease Maternal Grandmother   . Hearing loss Neg Hx     Social History Social History   Tobacco Use  . Smoking status: Current Every Day Smoker    Packs/day: 0.25    Years: 16.00    Pack years: 4.00    Types: Cigarettes  . Smokeless tobacco: Never Used  . Tobacco comment: since age 20  Vaping Use  . Vaping Use: Never used  Substance Use Topics  . Alcohol use: No    Comment: occasionally  . Drug use: No    Review of Systems  Constitutional:   No fever positive chills.  ENT:   No sore throat. No rhinorrhea. Cardiovascular:   No chest pain or syncope. Respiratory:   Positive shortness of breath without cough. Gastrointestinal:   Negative for abdominal pain, positive vomiting without diarrhea or constipation Musculoskeletal:   Negative for focal pain or swelling All other systems reviewed and are negative except as documented above in ROS and HPI.  ____________________________________________   PHYSICAL EXAM:  VITAL SIGNS: ED Triage Vitals  Enc Vitals Group     BP 01/17/20 1219 (!) 150/112     Pulse Rate 01/17/20 1219 81     Resp 01/17/20 1400 16     Temp 01/17/20 1219 97.8 F (36.6 C)     Temp Source 01/17/20 1219 Oral     SpO2 01/17/20 1219 100 %     Weight 01/17/20 1230 240 lb (108.9 kg)     Height 01/17/20 1230 5\' 3"  (1.6 m)     Head Circumference --      Peak Flow --      Pain Score 01/17/20 1230 6     Pain Loc --      Pain Edu? --      Excl. in GC? --     Vital signs reviewed, nursing assessments reviewed.   Constitutional:   Alert and oriented. Non-toxic appearance. Eyes:   Conjunctivae are normal. EOMI. PERRL. ENT      Head:   Normocephalic and atraumatic.      Nose:   Normal      Mouth/Throat:   Dry mucous  membranes      Neck:   No meningismus. Full ROM. Hematological/Lymphatic/Immunilogical:   No cervical lymphadenopathy. Cardiovascular:   RRR. Symmetric bilateral radial and DP pulses.  No murmurs. Cap refill less than 2 seconds. Respiratory:   Normal respiratory effort without tachypnea/retractions. Breath sounds are clear and equal bilaterally. No wheezes/rales/rhonchi. Gastrointestinal:   Soft and nontender. Non distended. There is no CVA tenderness.  No rebound, rigidity, or guarding.  Musculoskeletal:   Normal range of motion in all extremities. No joint effusions.  No  lower extremity tenderness.  No edema. Neurologic:   Normal speech and language.  Motor grossly intact. No acute focal neurologic deficits are appreciated.  Skin:    Skin is warm, dry and intact. No rash noted.  No petechiae, purpura, or bullae.  ____________________________________________    LABS (pertinent positives/negatives) (all labs ordered are listed, but only abnormal results are displayed) Labs Reviewed  COMPREHENSIVE METABOLIC PANEL - Abnormal; Notable for the following components:      Result Value   Potassium 3.0 (*)    CO2 19 (*)    Glucose, Bld 228 (*)    BUN 22 (*)    Total Protein 8.2 (*)    ALT 52 (*)    Anion gap 17 (*)    All other components within normal limits  URINALYSIS, COMPLETE (UACMP) WITH MICROSCOPIC - Abnormal; Notable for the following components:   Color, Urine YELLOW (*)    APPearance HAZY (*)    Glucose, UA >=500 (*)    Hgb urine dipstick MODERATE (*)    Ketones, ur 5 (*)    Protein, ur 30 (*)    All other components within normal limits  RESP PANEL BY RT-PCR (FLU A&B, COVID) ARPGX2  LIPASE, BLOOD  CBC  HCG, QUANTITATIVE, PREGNANCY  BETA-HYDROXYBUTYRIC ACID  POC URINE PREG, ED  POC SARS CORONAVIRUS 2 AG -  ED   ____________________________________________   EKG  intepreted by me NSR rate 62. Nl axis, intervals, QRS, ST segments, and T waves.    ____________________________________________    RADIOLOGY  DG Chest Portable 1 View  Result Date: 01/17/2020 CLINICAL DATA:  Shortness of breath and fatigue EXAM: PORTABLE CHEST 1 VIEW COMPARISON:  None. FINDINGS: The lungs are clear. Heart is upper normal in size with pulmonary vascularity normal. No adenopathy. No bone lesions. IMPRESSION: Lungs clear.  Heart upper normal in size. Electronically Signed   By: Bretta BangWilliam  Woodruff III M.D.   On: 01/17/2020 14:05    ____________________________________________   PROCEDURES Procedures  ____________________________________________  DIFFERENTIAL DIAGNOSIS   Dehydration, electrolyte abnormality, covid 19 / viral syndrome, pancreatitis, biliary disease, pneumonia  CLINICAL IMPRESSION / ASSESSMENT AND PLAN / ED COURSE  Medications ordered in the ED: Medications  ondansetron (ZOFRAN) injection 4 mg ( Intravenous Canceled Entry 01/17/20 1315)  ondansetron (ZOFRAN-ODT) disintegrating tablet 4 mg (4 mg Oral Given 01/17/20 1234)  ondansetron (ZOFRAN) injection 4 mg (4 mg Intravenous Given 01/17/20 1249)  lactated ringers bolus 1,000 mL (0 mLs Intravenous Stopped 01/17/20 1435)  ketorolac (TORADOL) 30 MG/ML injection 15 mg (15 mg Intravenous Given 01/17/20 1315)  pantoprazole (PROTONIX) injection 40 mg (40 mg Intravenous Given 01/17/20 1317)  alum & mag hydroxide-simeth (MAALOX/MYLANTA) 200-200-20 MG/5ML suspension 30 mL (30 mLs Oral Given 01/17/20 1512)  famotidine (PEPCID) tablet 40 mg (40 mg Oral Given 01/17/20 1512)    Pertinent labs & imaging results that were available during my care of the patient were reviewed by me and considered in my medical decision making (see chart for details).  Bonnie Shepard was evaluated in Emergency Department on 01/17/2020 for the symptoms described in the history of present illness. She was evaluated in the context of the global COVID-19 pandemic, which necessitated consideration that the patient might be at risk  for infection with the SARS-CoV-2 virus that causes COVID-19. Institutional protocols and algorithms that pertain to the evaluation of patients at risk for COVID-19 are in a state of rapid change based on information released by regulatory bodies including the CDC and federal and  state organizations. These policies and algorithms were followed during the patient's care in the ED.   Pt p/w vomiting and SOB, multiple symptoms most suspicious for Covid / viral syndrome.    Rapid Covid antigen negative, will send PCR.   ----------------------------------------- 3:19 PM on 01/17/2020 -----------------------------------------  Pt given IVF, zofran, protonix without relief. Still has nausea and vomiting, unable to take PO. If covid/flu negative, would CT a/p. Labs show mild metabolic acidosis, slightly elevated glucose with small ketones in urine.  If symptoms not significantly improved to allow for good PO intake, she may need hospitalization for new onset DM and intractable nausea/vomiting.  Clinical Course as of 01/17/20 1521  Wed Jan 17, 2020  1520 Care signed out to Dr. Lenard Lance [PS]    Clinical Course User Index [PS] Sharman Cheek, MD     ____________________________________________   FINAL CLINICAL IMPRESSION(S) / ED DIAGNOSES    Final diagnoses:  Viral syndrome  Intractable vomiting with nausea, unspecified vomiting type     ED Discharge Orders    None      Portions of this note were generated with dragon dictation software. Dictation errors may occur despite best attempts at proofreading.   Sharman Cheek, MD 01/17/20 1520

## 2020-01-17 NOTE — ED Notes (Signed)
Pt now able to tolerate po ginger ale and saltine crackers without nausea and vomiting.

## 2020-01-17 NOTE — ED Provider Notes (Signed)
-----------------------------------------   4:56 PM on 01/17/2020 -----------------------------------------  Patient care assumed from Dr. Scotty Court.  Patient's COVID PCR test is negative.  Given the patient's ongoing nausea and vomiting, was unable to tolerate oral fluids or pills we will admit the patient to the hospital service for further work-up and treatment.  Patient states for the past 1 to 2 weeks he has had pain to her right ear with a fullness sensation and decreased hearing.  On my examination she has an erythematous right tympanic membrane consistent with otitis media.  This could possibly the cause of the patient's fever she was describing over the weekend.  CT scan of the abdomen showed no acute findings.  Chest x-ray was clear.  Urine shows greater than 500 glucose in a nondiabetic patient.  Concern for possible new onset diabetes.  We will continue with IV hydration and Zofran.  We will add IV Rocephin to cover for her otitis media as she is not currently tolerating p.o.   [name], MD 01/17/20 [phone]

## 2020-01-17 NOTE — ED Notes (Signed)
Pt witnessed by RN vomiting zofran ODT up.  IV to be placed and zofran IV to be given.

## 2020-01-17 NOTE — Progress Notes (Signed)
PHARMACIST - PHYSICIAN COMMUNICATION  CONCERNING:  Enoxaparin (Lovenox) for DVT Prophylaxis    RECOMMENDATION: Patient was prescribed enoxaprin 40mg  q24 hours for VTE prophylaxis.   Filed Weights   01/17/20 1230  Weight: 108.9 kg (240 lb)    Body mass index is 42.51 kg/m.  Estimated Creatinine Clearance: 105.5 mL/min (by C-G formula based on SCr of 0.89 mg/dL).   Based on [facility] policy patient is candidate for enoxaparin 0.5mg /kg TBW SQ every 24 hours based on BMI being >30.   DESCRIPTION: Pharmacy has adjusted enoxaparin dose per [facility] policy.  Patient is now receiving enoxaparin 55mg  every 24hours    [facility], PharmD Pharmacy Resident  01/17/2020 6:59 PM

## 2020-01-17 NOTE — ED Notes (Signed)
Got up to bathroom and got clear yellow urine specimen.says she has pain upper mid abdomen.

## 2020-01-17 NOTE — ED Notes (Signed)
Pt states that she vomited up medication. MD notified.

## 2020-01-17 NOTE — ED Triage Notes (Signed)
First Nurse Note:  Arrives from home via [facility].  C/O vomiting all day and SOB.  Per report vss  NAD

## 2020-01-17 NOTE — H&P (Signed)
History and Physical   Bonnie Shepard HYI:[id number] DOB: May 06, 1985 DOA: 01/17/2020  PCP: Berniece Pap, FNP  Outpatient Specialists: Dr. Servando Snare, gastroenterologist  Patient coming from: home  I have personally briefly reviewed patient's old medical records in [facility] EMR.  Chief Concern: Fever with nausea and vomiting  HPI: Bonnie Shepard is a 35 y.o. female with medical history significant for dverticulosis, depression/anxiety, obesity, presented to the emergency department for chief concerns of nausea and vomiting and fever.  She reports Tmax of 104 at home, and she took ibuprofen and acetaminophen and her temp decreased to 98.2. She endorsed nausea and vomiting that started this Saturday, 01/13/2020. She reports no family member getting sick.  She reports no unusual changes to diet. She denies no resent changes to medications.   She endorses right ear ache.  She had telemedicine visit on Monday, 01/15/20, and was prescribed, zofran po, flonase, albuterol, benzonite for cough which she didn't take.   She reports the last time she was this sick was when she was pregnant. She endorsed chest pain and shortness of breath earlier and have now resolved.   Social history: lives with mother, step father, and daughter. She smokes socially and smokes about 1 pack per week. She denies etoh use and recreational drug use.   ROS: Constitutional: no weight change, + fever ENT/Mouth: + sore throat, + rhinorrhea Eyes: no eye pain, no vision changes Cardiovascular: + chest pain, + dyspnea,  no edema, no palpitations Respiratory: + cough, + sputum that is dark brown or dark red, no wheezing Gastrointestinal: + nausea, + vomiting, + diarrhea on Sunday which she described as loose, no constipation Genitourinary: no urinary incontinence, no dysuria, no hematuria Musculoskeletal: no arthralgias, no myalgias Skin: no skin lesions, no pruritus Neuro: + weakness, no loss of consciousness, no syncope Psych:  no anxiety, no depression, + decrease appetite Heme/Lymph: no bruising, no bleeding  ED Course: Discussed with ED provider, requesting hospitalization for intractable nausea and vomiting  Assessment/Plan  Active Problems:   Intractable vomiting with nausea   Intractable nausea and vomiting suspect secondary to gastroenteritis versus possible new onset diabetes mellitus -CT abdomen was read as negative for acute intra-abdominal or pelvic pathology.  Fatty liver.  Scattered sigmoid diverticula.  No bowel obstruction.  Normal appendix. - Supportive care - COVID was negative - Ondansetron as needed  Hyperglycemia-patient does not have diagnosis of diabetes - Checking A1c  Right otitis media -  -Bilateral external ear was negative for pain with manipulation - Right tympanic membrane was positive for redness and cone of light was not visible.  No visible fluid filled on visual exam - Status post ceftriaxone IV per ED provider -Augmentin 875-125 mg twice daily p.o.  Hypokalemia-secondary to GI loss via vomiting - Replace with Polycitra solution 40 mEq twice daily, 2 doses ordered -No peaked T waves or flattened T waves on EKG -BMP in a.m.  Anxiety/depression-resumed home Lexapro 15 mg p.o. daily  Chart reviewed.   DVT prophylaxis: Enoxaparin subcutaneous daily Code Status: full code  Diet: N.p.o., advance as tolerated Family Communication: called Elnita Maxwell, mother and updated mother per patient request Disposition Plan: pending clinical course Consults called: none at this time Admission status: observation,   Past Medical History:  Diagnosis Date  . Acid reflux   . Asthma   . Family history of adverse reaction to anesthesia    Mother - PONV  . Infection    freq UTI  . Vaginal Pap smear, abnormal  f/u wnl  . Wears contact lenses    Past Surgical History:  Procedure Laterality Date  . COLONOSCOPY WITH PROPOFOL N/A 04/03/2019   Procedure: COLONOSCOPY WITH PROPOFOL;   Surgeon: Midge Minium, MD;  Location: [facility];  Service: Endoscopy;  Laterality: N/A;  Priority 3  . DILATION AND EVACUATION N/A 09/07/2013   Procedure: DILATATION AND EVACUATION;  Surgeon: Freddrick March. Tenny Craw, MD;  Location: [facility];  Service: Gynecology;  Laterality: N/A;  . TONSILLECTOMY     Social History:  reports that she has been smoking cigarettes. She has a 4.00 pack-year smoking history. She has never used smokeless tobacco. She reports that she does not drink alcohol and does not use drugs.  No Known Allergies Family History  Problem Relation Age of Onset  . Hypertension Other   . Cancer Other   . Hypertension Mother   . Hypertension Father   . Hypertension Maternal Grandmother   . Heart disease Maternal Grandmother   . Hearing loss Neg Hx    Family history: Family history reviewed and not pertinent  Prior to Admission medications   Medication Sig Start Date End Date Taking? Authorizing Provider  albuterol (VENTOLIN HFA) 108 (90 Base) MCG/ACT inhaler Inhale 2 puffs into the lungs every 6 (six) hours as needed for wheezing or shortness of breath. 01/15/20   Margaretann Loveless, PA-C  benzonatate (TESSALON) 200 MG capsule Take 1 capsule (200 mg total) by mouth 3 (three) times daily as needed. 01/15/20   Margaretann Loveless, PA-C  cyclobenzaprine (FLEXERIL) 10 MG tablet Take 1 tablet (10 mg total) by mouth at bedtime. 01/19/19   Flinchum, Eula Fried, FNP  escitalopram (LEXAPRO) 10 MG tablet Take 1 tablet (10 mg total) by mouth daily. 10/10/19   Flinchum, Eula Fried, FNP  escitalopram (LEXAPRO) 5 MG tablet Take 1 tablet (5 mg total) by mouth daily. 10/10/19   Flinchum, Eula Fried, FNP  fluticasone (FLONASE) 50 MCG/ACT nasal spray Place 2 sprays into both nostrils daily. 01/15/20   Margaretann Loveless, PA-C  hydrocortisone (ANUSOL-HC) 25 MG suppository Place 1 suppository (25 mg total) rectally 2 (two) times daily. 03/23/19   Flinchum, Eula Fried, FNP  ondansetron (ZOFRAN ODT) 8 MG  disintegrating tablet Take 1 tablet (8 mg total) by mouth every 8 (eight) hours as needed for nausea or vomiting. 01/15/20   Margaretann Loveless, PA-C  pantoprazole (PROTONIX) 40 MG tablet Take 1 tablet (40 mg total) by mouth daily. 10/10/19   Flinchum, Eula Fried, FNP  promethazine (PHENERGAN) 25 MG tablet Take 1 tablet (25 mg total) by mouth every 4 (four) hours as needed for nausea or vomiting. 06/08/19   Emily Filbert, MD   Physical Exam: Vitals:   01/17/20 1219 01/17/20 1230 01/17/20 1400 01/17/20 1513  BP: (!) 150/112  (!) 174/92 (!) 180/85  Pulse: 81  (!) 56 (!) 52  Resp:   16 18  Temp: 97.8 F (36.6 C)   97.9 F (36.6 C)  TempSrc: Oral     SpO2: 100%  100% 100%  Weight:  108.9 kg    Height:  5\' 3"  (1.6 m)     Constitutional: appears age-appropriate, NAD, calm, comfortable Eyes: PERRL, lids and conjunctivae normal ENMT: Mucous membranes are moist. Posterior pharynx clear of any exudate or lesions. Age-appropriate dentition. Hearing appropriate.  Right tympanic membrane is positive for redness Neck: normal, supple, no masses, no thyromegaly Respiratory: clear to auscultation bilaterally, no wheezing, no crackles. Normal respiratory effort. No accessory muscle use.  Cardiovascular: Regular rate and rhythm, no murmurs / rubs / gallops. No extremity edema. 2+ pedal pulses. No carotid bruits.  Abdomen: Obese abdomen with pannus, no tenderness, no masses palpated, no hepatosplenomegaly. Bowel sounds positive.  Musculoskeletal: no clubbing / cyanosis. No joint deformity upper and lower extremities. Good ROM, no contractures, no atrophy. Normal muscle tone.  Skin: no rashes, lesions, ulcers. No induration Neurologic: Sensation intact. Strength 5/5 in all 4.  Psychiatric: Normal judgment and insight. Alert and oriented x 3. Normal mood.   EKG: independently reviewed, showing normal sinus rhythm with rate of 62, QTc 432  Chest x-ray on Admission: I personally reviewed and I agree  with radiologist reading as below.  CT ABDOMEN PELVIS W CONTRAST  Result Date: 01/17/2020 CLINICAL DATA:  35 year old female with abdominal pain. EXAM: CT ABDOMEN AND PELVIS WITH CONTRAST TECHNIQUE: Multidetector CT imaging of the abdomen and pelvis was performed using the standard protocol following bolus administration of intravenous contrast. CONTRAST:  100mL OMNIPAQUE IOHEXOL 300 MG/ML  SOLN COMPARISON:  CT abdomen pelvis dated 06/08/2019. FINDINGS: Lower chest: The visualized lung bases are clear. No intra-abdominal free air or free fluid. Hepatobiliary: Fatty liver. No intrahepatic biliary dilatation. The gallbladder is unremarkable. Pancreas: Unremarkable. No pancreatic ductal dilatation or surrounding inflammatory changes. Spleen: Normal in size without focal abnormality. Adrenals/Urinary Tract: The adrenal glands unremarkable. Subcentimeter left renal interpolar hypodense lesion is too small to characterize. There is no hydronephrosis on either side. The visualized ureters and urinary bladder appear unremarkable. Stomach/Bowel: Small scattered sigmoid diverticula without active inflammatory changes. There is no bowel obstruction or active inflammation. The appendix is normal. Vascular/Lymphatic: The abdominal aorta and IVC unremarkable. No portal venous gas. There is no adenopathy. Reproductive: The uterus is anteverted and grossly unremarkable. No adnexal masses. Other: None Musculoskeletal: No acute or significant osseous findings. IMPRESSION: 1. No acute intra-abdominal or pelvic pathology. 2. Fatty liver. 3. Small scattered sigmoid diverticula. No bowel obstruction. Normal appendix. Electronically Signed   By: Elgie CollardArash  Radparvar M.D.   On: 01/17/2020 16:02   DG Chest Portable 1 View  Result Date: 01/17/2020 CLINICAL DATA:  Shortness of breath and fatigue EXAM: PORTABLE CHEST 1 VIEW COMPARISON:  None. FINDINGS: The lungs are clear. Heart is upper normal in size with pulmonary vascularity normal. No  adenopathy. No bone lesions. IMPRESSION: Lungs clear.  Heart upper normal in size. Electronically Signed   By: Bretta BangWilliam  Woodruff III M.D.   On: 01/17/2020 14:05   Labs on Admission: I have personally reviewed following labs  CBC: Recent Labs  Lab 01/17/20 1232  WBC 10.4  HGB 14.9  HCT 42.3  MCV 87.9  PLT 323   Basic Metabolic Panel: Recent Labs  Lab 01/17/20 1232  NA 136  K 3.0*  CL 100  CO2 19*  GLUCOSE 228*  BUN 22*  CREATININE 0.89  CALCIUM 9.4   GFR: Estimated Creatinine Clearance: 105.5 mL/min (by C-G formula based on SCr of 0.89 mg/dL). Liver Function Tests: Recent Labs  Lab 01/17/20 1232  AST 36  ALT 52*  ALKPHOS 97  BILITOT 0.8  PROT 8.2*  ALBUMIN 3.9   Recent Labs  Lab 01/17/20 1232  LIPASE 25   Urine analysis:    Component Value Date/Time   COLORURINE YELLOW (A) 01/17/2020 1437   APPEARANCEUR HAZY (A) 01/17/2020 1437   LABSPEC 1.016 01/17/2020 1437   PHURINE 7.0 01/17/2020 1437   GLUCOSEU >=500 (A) 01/17/2020 1437   HGBUR MODERATE (A) 01/17/2020 1437   BILIRUBINUR NEGATIVE 01/17/2020 1437  KETONESUR 5 (A) 01/17/2020 1437   PROTEINUR 30 (A) 01/17/2020 1437   UROBILINOGEN 0.2 10/16/2015 0840   NITRITE NEGATIVE 01/17/2020 1437   LEUKOCYTESUR NEGATIVE 01/17/2020 1437   Cornisha Zetino N Dequan Kindred D.O. [facility]  If 7PM-7AM, please contact overnight-coverage provider If 7AM-7PM, please contact day coverage provider [email or web address]  01/17/2020, 5:02 PM

## 2020-01-17 NOTE — ED Triage Notes (Signed)
Pt comes into the ED via [facility] from home c/o vomiting, SHOB, and possible dehydration.  Pt states she has been vomiting since Saturday.  Pt currently has even and unlabored respirations.  Pt denies any abdominal pain or CP.

## 2020-01-18 ENCOUNTER — Other Ambulatory Visit: Payer: Self-pay | Admitting: Internal Medicine

## 2020-01-18 ENCOUNTER — Encounter: Payer: Self-pay | Admitting: Physician Assistant

## 2020-01-18 DIAGNOSIS — H6691 Otitis media, unspecified, right ear: Secondary | ICD-10-CM

## 2020-01-18 DIAGNOSIS — R112 Nausea with vomiting, unspecified: Secondary | ICD-10-CM

## 2020-01-18 DIAGNOSIS — E876 Hypokalemia: Secondary | ICD-10-CM

## 2020-01-18 LAB — CBC
HCT: 41.1 % (ref 36.0–46.0)
Hemoglobin: 14.1 g/dL (ref 12.0–15.0)
MCH: 30.8 pg (ref 26.0–34.0)
MCHC: 34.3 g/dL (ref 30.0–36.0)
MCV: 89.7 fL (ref 80.0–100.0)
Platelets: 294 10*3/uL (ref 150–400)
RBC: 4.58 MIL/uL (ref 3.87–5.11)
RDW: 12.3 % (ref 11.5–15.5)
WBC: 11.2 10*3/uL — ABNORMAL HIGH (ref 4.0–10.5)
nRBC: 0 % (ref 0.0–0.2)

## 2020-01-18 LAB — BASIC METABOLIC PANEL
Anion gap: 9 (ref 5–15)
BUN: 13 mg/dL (ref 6–20)
CO2: 28 mmol/L (ref 22–32)
Calcium: 8.6 mg/dL — ABNORMAL LOW (ref 8.9–10.3)
Chloride: 97 mmol/L — ABNORMAL LOW (ref 98–111)
Creatinine, Ser: 0.63 mg/dL (ref 0.44–1.00)
GFR, Estimated: >60 mL/min (ref >60)
Glucose, Bld: 134 mg/dL — ABNORMAL HIGH (ref 70–99)
Potassium: 2.8 mmol/L — ABNORMAL LOW (ref 3.5–5.1)
Sodium: 134 mmol/L — ABNORMAL LOW (ref 135–145)

## 2020-01-18 MED ORDER — ONDANSETRON 8 MG PO TBDP: 8.0000 mg | ORAL_TABLET | Freq: Three times a day (TID) | ORAL | 0 refills | Status: DC | PRN

## 2020-01-18 MED ORDER — AMOXICILLIN-POT CLAVULANATE 875-125 MG PO TABS: 1.0000 | ORAL_TABLET | Freq: Two times a day (BID) | ORAL | 0 refills | Status: DC

## 2020-01-18 MED ORDER — POTASSIUM CHLORIDE CRYS ER 20 MEQ PO TBCR
40.0000 meq | EXTENDED_RELEASE_TABLET | Freq: Two times a day (BID) | ORAL | Status: DC
  Administered 2020-01-18: 40 meq via ORAL
  Filled 2020-01-18: qty 2

## 2020-01-18 MED ORDER — AMLODIPINE BESYLATE 5 MG PO TABS
10.0000 mg | ORAL_TABLET | Freq: Every day | ORAL | Status: DC
  Administered 2020-01-18: 10 mg via ORAL
  Filled 2020-01-18: qty 2

## 2020-01-18 MED ORDER — SODIUM CHLORIDE 0.9 % IV SOLN: INTRAVENOUS | Status: DC

## 2020-01-18 NOTE — Discharge Summary (Signed)
Physician Discharge Summary  Bonnie Shepard WUJ:[id number] DOB: 07-16-1985 DOA: 01/17/2020  PCP: Berniece Pap, FNP  Admit date: 01/17/2020 Discharge date: 01/18/2020  Admitted From:  Home  Disposition: home  Recommendations for Outpatient Follow-up:  1. Follow up with PCP in 1 week  Home Health: no  Equipment/Devices:  Discharge Condition: stable  CODE STATUS: full  Diet recommendation: advance slowly and as tolerated   Brief/Interim Summary: HPI was taken from Dr. Sedalia Muta:  Bonnie Shepard is a 35 y.o. female with medical history significant for dverticulosis, depression/anxiety, obesity, presented to the emergency department for chief concerns of nausea and vomiting and fever.  She reports Tmax of 104 at home, and she took ibuprofen and acetaminophen and her temp decreased to 98.2. She endorsed nausea and vomiting that started this Saturday, 01/13/2020. She reports no family member getting sick.  She reports no unusual changes to diet. She denies no resent changes to medications.   She endorses right ear ache.  She had telemedicine visit on Monday, 01/15/20, and was prescribed, zofran po, flonase, albuterol, benzonite for cough which she didn't take.   She reports the last time she was this sick was when she was pregnant. She endorsed chest pain and shortness of breath earlier and have now resolved.   Hospital Course from Dr. Mayford Knife 01/18/20: Pt presented w/ nausea & vomiting of unknown etiology, possibly gastroenteritis. CT abd/pelvis was neg for any acute intra-abd/pelvic pathology. Urine pregnancy test was neg as well. Nausea & vomiting had resolved prior to d/c. Of note, pt was found to have right otitis media and was started on augmentin. Pt did not require therapy as pt was ambulating and transferring independently.   Discharge Diagnoses:  Active Problems:   Nausea   Anxiety with depression   Body mass index (BMI) of 40.1-44.9 in adult [facility])   Gastroesophageal reflux  disease   Intractable vomiting with nausea Intractable nausea and vomiting: etiology unclear, possible gastroenteritis. CT abdomen/pelvis was negative for acute intra-abdominal or pelvic pathology. Continue on IVFs. Zofran prn  Elevated BP w/o dx of HTN: likely secondary to above. Will continue to monitor   Hyperglycemia: HbA1c 5.4. Will continue to monitor   Right otitis media : continue on augmentin   Hypokalemia: KCl repleted. Will continue to monitor   Depression: severity unknown. Continue on home dose of lexapro     Discharge Instructions  Discharge Instructions    Diet general   Complete by: As directed    Advance slowly and as tolerated   Discharge instructions   Complete by: As directed    F/u w/ PCP in 1 week   Increase activity slowly   Complete by: As directed      Allergies as of 01/18/2020   No Known Allergies     Medication List    TAKE these medications   albuterol 108 (90 Base) MCG/ACT inhaler Commonly known as: VENTOLIN HFA Inhale 2 puffs into the lungs every 6 (six) hours as needed for wheezing or shortness of breath.   amoxicillin-clavulanate 875-125 MG tablet Commonly known as: AUGMENTIN Take 1 tablet by mouth every 12 (twelve) hours for 4 days.   benzonatate 200 MG capsule Commonly known as: TESSALON Take 1 capsule (200 mg total) by mouth 3 (three) times daily as needed.   escitalopram 10 MG tablet Commonly known as: Lexapro Take 1 tablet (10 mg total) by mouth daily.   escitalopram 5 MG tablet Commonly known as: Lexapro Take 1 tablet (5 mg total) by mouth  daily.   fluticasone 50 MCG/ACT nasal spray Commonly known as: FLONASE Place 2 sprays into both nostrils daily.   ondansetron 8 MG disintegrating tablet Commonly known as: Zofran ODT Take 1 tablet (8 mg total) by mouth every 8 (eight) hours as needed for up to 7 days for nausea or vomiting.   pantoprazole 40 MG tablet Commonly known as: PROTONIX Take 1 tablet (40 mg total) by  mouth daily.       Follow-up Information    Flinchum, Eula FriedMichelle S, FNP In 2 days.   Specialty: Family Medicine Contact information: [address] [place] [address] [phone]        [facility] EMERGENCY DEPARTMENT.   Specialty: Emergency Medicine Why: If symptoms worsen Contact information: [address] [id number] [address] [place] [id number] [phone]             No Known Allergies  Consultations:     Procedures/Studies: CT ABDOMEN PELVIS W CONTRAST  Result Date: 01/17/2020 CLINICAL DATA:  35 year old female with abdominal pain. EXAM: CT ABDOMEN AND PELVIS WITH CONTRAST TECHNIQUE: Multidetector CT imaging of the abdomen and pelvis was performed using the standard protocol following bolus administration of intravenous contrast. CONTRAST:  100mL OMNIPAQUE IOHEXOL 300 MG/ML  SOLN COMPARISON:  CT abdomen pelvis dated 06/08/2019. FINDINGS: Lower chest: The visualized lung bases are clear. No intra-abdominal free air or free fluid. Hepatobiliary: Fatty liver. No intrahepatic biliary dilatation. The gallbladder is unremarkable. Pancreas: Unremarkable. No pancreatic ductal dilatation or surrounding inflammatory changes. Spleen: Normal in size without focal abnormality. Adrenals/Urinary Tract: The adrenal glands unremarkable. Subcentimeter left renal interpolar hypodense lesion is too small to characterize. There is no hydronephrosis on either side. The visualized ureters and urinary bladder appear unremarkable. Stomach/Bowel: Small scattered sigmoid diverticula without active inflammatory changes. There is no bowel obstruction or active inflammation. The appendix is normal. Vascular/Lymphatic: The abdominal aorta and IVC unremarkable. No portal venous gas. There is no adenopathy. Reproductive: The uterus is anteverted and grossly unremarkable. No adnexal masses. Other: None Musculoskeletal: No acute or significant osseous  findings. IMPRESSION: 1. No acute intra-abdominal or pelvic pathology. 2. Fatty liver. 3. Small scattered sigmoid diverticula. No bowel obstruction. Normal appendix. Electronically Signed   By: Elgie CollardArash  Radparvar M.D.   On: 01/17/2020 16:02   DG Chest Portable 1 View  Result Date: 01/17/2020 CLINICAL DATA:  Shortness of breath and fatigue EXAM: PORTABLE CHEST 1 VIEW COMPARISON:  None. FINDINGS: The lungs are clear. Heart is upper normal in size with pulmonary vascularity normal. No adenopathy. No bone lesions. IMPRESSION: Lungs clear.  Heart upper normal in size. Electronically Signed   By: Bretta BangWilliam  Woodruff III M.D.   On: 01/17/2020 14:05   DG Foot Complete Left  Result Date: 01/11/2020 Please see detailed radiograph report in office note.    Subjective: Pt c/o fatigue    Discharge Exam: Vitals:   01/18/20 0924 01/18/20 1249  BP: (!) 179/111 (!) 162/90  Pulse: 72 74  Resp: 20 20  Temp:  98.7 F (37.1 C)  SpO2: 100% 99%   Vitals:   01/18/20 0217 01/18/20 0529 01/18/20 0924 01/18/20 1249  BP: (!) 159/90 (!) 150/88 (!) 179/111 (!) 162/90  Pulse: 76 (!) 51 72 74  Resp: 18 18 20 20   Temp:    98.7 F (37.1 C)  TempSrc:    Oral  SpO2: 100% 100% 100% 99%  Weight:      Height:        General: Pt  is alert, awake, not in acute distress Cardiovascular: S1/S2 +, no rubs, no gallops Respiratory: CTA bilaterally, no wheezing, no rhonchi Abdominal: Soft, NT, ND, bowel sounds + Extremities:  no cyanosis    The results of significant diagnostics from this hospitalization (including imaging, microbiology, ancillary and laboratory) are listed below for reference.     Microbiology: Recent Results (from the past 240 hour(s))  Resp Panel by RT-PCR (Flu A&B, Covid) Urine, Clean Catch     Status: None   Collection Time: 01/17/20  2:37 PM   Specimen: Urine, Clean Catch; Nasopharyngeal(NP) swabs in vial transport medium  Result Value Ref Range Status   SARS Coronavirus 2 by RT PCR NEGATIVE  NEGATIVE Final    Comment: (NOTE) SARS-CoV-2 target nucleic acids are NOT DETECTED.  The SARS-CoV-2 RNA is generally detectable in upper respiratory specimens during the acute phase of infection. The lowest concentration of SARS-CoV-2 viral copies this assay can detect is 138 copies/mL. A negative result does not preclude SARS-Cov-2 infection and should not be used as the sole basis for treatment or other patient management decisions. A negative result may occur with  improper specimen collection/handling, submission of specimen other than nasopharyngeal swab, presence of viral mutation(s) within the areas targeted by this assay, and inadequate number of viral copies(<138 copies/mL). A negative result must be combined with clinical observations, patient history, and epidemiological information. The expected result is Negative.  Fact Sheet for Patients:  BloggerCourse.com  Fact Sheet for Healthcare Providers:  SeriousBroker.it  This test is no t yet approved or cleared by the [place] FDA and  has been authorized for detection and/or diagnosis of SARS-CoV-2 by FDA under an Emergency Use Authorization (EUA). This EUA will remain  in effect (meaning this test can be used) for the duration of the COVID-19 declaration under Section 564(b)(1) of the Act, 21 U.S.C.section 360bbb-3(b)(1), unless the authorization is terminated  or revoked sooner.       Influenza A by PCR NEGATIVE NEGATIVE Final   Influenza B by PCR NEGATIVE NEGATIVE Final    Comment: (NOTE) The Xpert Xpress SARS-CoV-2/FLU/RSV plus assay is intended as an aid in the diagnosis of influenza from Nasopharyngeal swab specimens and should not be used as a sole basis for treatment. Nasal washings and aspirates are unacceptable for Xpert Xpress SARS-CoV-2/FLU/RSV testing.  Fact Sheet for Patients: BloggerCourse.com  Fact Sheet for Healthcare  Providers: SeriousBroker.it  This test is not yet approved or cleared by the [place] FDA and has been authorized for detection and/or diagnosis of SARS-CoV-2 by FDA under an Emergency Use Authorization (EUA). This EUA will remain in effect (meaning this test can be used) for the duration of the COVID-19 declaration under Section 564(b)(1) of the Act, 21 U.S.C. section 360bbb-3(b)(1), unless the authorization is terminated or revoked.  Performed at [facility], [address] [place], [place] [address]      Labs: BNP (last 3 results) No results for input(s): BNP in the last 8760 hours. Basic Metabolic Panel: Recent Labs  Lab 01/17/20 1232 01/18/20 0500  NA 136 134*  K 3.0* 2.8*  CL 100 97*  CO2 19* 28  GLUCOSE 228* 134*  BUN 22* 13  CREATININE 0.89 0.63  CALCIUM 9.4 8.6*   Liver Function Tests: Recent Labs  Lab 01/17/20 1232  AST 36  ALT 52*  ALKPHOS 97  BILITOT 0.8  PROT 8.2*  ALBUMIN 3.9   Recent Labs  Lab 01/17/20 1232  LIPASE 25   No results  for input(s): AMMONIA in the last 168 hours. CBC: Recent Labs  Lab 01/17/20 1232 01/18/20 0500  WBC 10.4 11.2*  HGB 14.9 14.1  HCT 42.3 41.1  MCV 87.9 89.7  PLT 323 294   Cardiac Enzymes: No results for input(s): CKTOTAL, CKMB, CKMBINDEX, TROPONINI in the last 168 hours. BNP: Invalid input(s): POCBNP CBG: No results for input(s): GLUCAP in the last 168 hours. D-Dimer No results for input(s): DDIMER in the last 72 hours. Hgb A1c Recent Labs    01/17/20 1852  HGBA1C 5.4   Lipid Profile No results for input(s): CHOL, HDL, LDLCALC, TRIG, CHOLHDL, LDLDIRECT in the last 72 hours. Thyroid function studies Recent Labs    01/17/20 1852  TSH 0.752   Anemia work up No results for input(s): VITAMINB12, FOLATE, FERRITIN, TIBC, IRON, RETICCTPCT in the last 72 hours. Urinalysis    Component Value Date/Time   COLORURINE YELLOW (A) 01/17/2020 1437   APPEARANCEUR  HAZY (A) 01/17/2020 1437   LABSPEC 1.016 01/17/2020 1437   PHURINE 7.0 01/17/2020 1437   GLUCOSEU >=500 (A) 01/17/2020 1437   HGBUR MODERATE (A) 01/17/2020 1437   BILIRUBINUR NEGATIVE 01/17/2020 1437   KETONESUR 5 (A) 01/17/2020 1437   PROTEINUR 30 (A) 01/17/2020 1437   UROBILINOGEN 0.2 10/16/2015 0840   NITRITE NEGATIVE 01/17/2020 1437   LEUKOCYTESUR NEGATIVE 01/17/2020 1437   Sepsis Labs Invalid input(s): PROCALCITONIN,  WBC,  LACTICIDVEN Microbiology Recent Results (from the past 240 hour(s))  Resp Panel by RT-PCR (Flu A&B, Covid) Urine, Clean Catch     Status: None   Collection Time: 01/17/20  2:37 PM   Specimen: Urine, Clean Catch; Nasopharyngeal(NP) swabs in vial transport medium  Result Value Ref Range Status   SARS Coronavirus 2 by RT PCR NEGATIVE NEGATIVE Final    Comment: (NOTE) SARS-CoV-2 target nucleic acids are NOT DETECTED.  The SARS-CoV-2 RNA is generally detectable in upper respiratory specimens during the acute phase of infection. The lowest concentration of SARS-CoV-2 viral copies this assay can detect is 138 copies/mL. A negative result does not preclude SARS-Cov-2 infection and should not be used as the sole basis for treatment or other patient management decisions. A negative result may occur with  improper specimen collection/handling, submission of specimen other than nasopharyngeal swab, presence of viral mutation(s) within the areas targeted by this assay, and inadequate number of viral copies(<138 copies/mL). A negative result must be combined with clinical observations, patient history, and epidemiological information. The expected result is Negative.  Fact Sheet for Patients:  BloggerCourse.com  Fact Sheet for Healthcare Providers:  SeriousBroker.it  This test is no t yet approved or cleared by the [place] FDA and  has been authorized for detection and/or diagnosis of SARS-CoV-2 by FDA  under an Emergency Use Authorization (EUA). This EUA will remain  in effect (meaning this test can be used) for the duration of the COVID-19 declaration under Section 564(b)(1) of the Act, 21 U.S.C.section 360bbb-3(b)(1), unless the authorization is terminated  or revoked sooner.       Influenza A by PCR NEGATIVE NEGATIVE Final   Influenza B by PCR NEGATIVE NEGATIVE Final    Comment: (NOTE) The Xpert Xpress SARS-CoV-2/FLU/RSV plus assay is intended as an aid in the diagnosis of influenza from Nasopharyngeal swab specimens and should not be used as a sole basis for treatment. Nasal washings and aspirates are unacceptable for Xpert Xpress SARS-CoV-2/FLU/RSV testing.  Fact Sheet for Patients: BloggerCourse.com  Fact Sheet for Healthcare Providers: SeriousBroker.it  This test is not yet  approved or cleared by the [place] FDA and has been authorized for detection and/or diagnosis of SARS-CoV-2 by FDA under an Emergency Use Authorization (EUA). This EUA will remain in effect (meaning this test can be used) for the duration of the COVID-19 declaration under Section 564(b)(1) of the Act, 21 U.S.C. section 360bbb-3(b)(1), unless the authorization is terminated or revoked.  Performed at [facility], [address] [place], [place] [address]      Time coordinating discharge: Over 30 minutes  SIGNED:   Charise KillianJamiese M Shaterra Sanzone, MD  [facility] 01/18/2020, 2:12 PM Pager   If 7PM-7AM, please contact night-coverage

## 2020-01-18 NOTE — ED Notes (Signed)
Patient actively vomiting. Advised that any time she sits up, her ear throbs and makes he feel nauseated. Gave Zofran PRN for nausea. Patient expressed relief when rechecking 10 minutes later.

## 2020-01-19 LAB — URINE CULTURE: Culture: 70000 — AB

## 2020-01-24 ENCOUNTER — Encounter: Payer: Self-pay | Admitting: Adult Health

## 2020-01-24 ENCOUNTER — Other Ambulatory Visit: Payer: Self-pay | Admitting: Adult Health

## 2020-01-24 ENCOUNTER — Telehealth (INDEPENDENT_AMBULATORY_CARE_PROVIDER_SITE_OTHER): Payer: No Typology Code available for payment source | Admitting: Adult Health

## 2020-01-24 DIAGNOSIS — R030 Elevated blood-pressure reading, without diagnosis of hypertension: Secondary | ICD-10-CM

## 2020-01-24 DIAGNOSIS — H60501 Unspecified acute noninfective otitis externa, right ear: Secondary | ICD-10-CM | POA: Diagnosis not present

## 2020-01-24 DIAGNOSIS — H6501 Acute serous otitis media, right ear: Secondary | ICD-10-CM

## 2020-01-24 MED ORDER — AZITHROMYCIN 250 MG PO TABS: ORAL_TABLET | ORAL | 0 refills | Status: DC

## 2020-01-24 MED ORDER — CIPROFLOXACIN HCL 0.2 % OT SOLN: 0.2000 mL | Freq: Two times a day (BID) | OTIC | 0 refills | Status: DC

## 2020-01-24 MED ORDER — CEPHALEXIN 500 MG PO CAPS: 500.0000 mg | ORAL_CAPSULE | Freq: Four times a day (QID) | ORAL | 0 refills | Status: DC

## 2020-01-24 NOTE — Progress Notes (Signed)
MyChart Telephone.     Virtual Visit via Video Note   This visit type was conducted due to national recommendations for restrictions regarding the COVID-19 Pandemic (e.g. social distancing) in an effort to limit this patient's exposure and mitigate transmission in our community. This patient is at least at moderate risk for complications without adequate follow up. This format is felt to be most appropriate for this patient at this time. Physical exam was limited by quality of the video and audio technology used for the visit.  Parties involved in visit as below:   Patient location:  At home  Provider location: Provider: Provider's office at  [facility], [place].     I discussed the limitations of evaluation and management by telemedicine and the availability of in person appointments. The patient expressed understanding and agreed to proceed.  Patient: Bonnie Shepard   DOB: 12/19/1985   35 y.o. Female  MRN: [id number] Visit Date: 01/24/2020  Today's healthcare provider: Jairo Ben, FNP   Chief Complaint  Patient presents with  . ER Follow Up  . Hypertension   Subjective    HPI  Follow up ER visit  Patient was seen in ER for shortness of breath, nausea/vomiting on 01/17/20. She was treated for viral syndrome. Treatment for this included IV therapy, Zofran and Protonix. She reports satisfactory compliance with treatment. She reports this condition is Improved.   CT abdomen. IMPRESSION: 1. No acute intra-abdominal or pelvic pathology. 2. Fatty liver. 3. Small scattered sigmoid diverticula. No bowel obstruction. Normal appendix.  Electronically Signed   By: Elgie Collard M.D.   On: 01/17/2020 16:02  Patient reports that her blood pressure has been staying elevated since recent hospital visit. Patient states that blood pressure readings outside of office have been > 140/101 and reports symptoms of headache and vertigo. Heart rate 67 and  128/97 at lunch today,  ---------------------------------------------------------------------------------------- ----------------------------------------------------------------------------------------- Follow up for elevated blood pressure she says that all started with her ear pain. She is still  She had Amoxicillin for right ear. She does have some congestion in head, Rocephin was given IV. Also given Augmentin. Ear pain has worsened.   Patient  denies any fever, body aches,chills, rash, chest pain, shortness of breath, nausea, vomiting, or diarrhea.  Denies dizziness, lightheadedness, pre syncopal or syncopal episodes.     Patient Active Problem List   Diagnosis Date Noted  . Intractable vomiting with nausea 01/17/2020  . History of foot fracture- left  10/10/2019  . Fatigue 08/09/2019  . Lymphadenopathy- posterior left side  08/09/2019  . Gastroesophageal reflux disease 07/21/2019  . History of nausea and vomiting 07/21/2019  . Epigastric burning sensation 07/21/2019  . Hematochezia   . Hemorrhoids, external, thrombosed 03/23/2019  . Rectal or anal pain 03/23/2019  . History of rectal bleeding 03/23/2019  . Trichomonas vaginitis 02/28/2019  . Vaginal high risk HPV DNA test positive 02/28/2019  . Anxiety with depression 02/23/2019  . Body mass index (BMI) of 40.1-44.9 in adult (HCC) 02/23/2019  . Left foot pain 02/23/2019  . Cervical cancer screening 02/23/2019  . Morbid obesity (HCC) 02/21/2019  . Itching in the vaginal area 02/21/2019  . History of pregnancy loss in prior pregnancy, currently pregnant in second trimester 07/10/2016  . Incontinence overflow, stress female 12/03/2015  . Nausea 01/25/2008   Past Medical History:  Diagnosis Date  . Acid reflux   . Asthma   . Family history of adverse reaction to anesthesia    Mother -  PONV  . Infection    freq UTI  . Vaginal Pap smear, abnormal    f/u wnl  . Wears contact lenses    No Known Allergies     Medications: Outpatient Medications Prior to Visit  Medication Sig  . escitalopram (LEXAPRO) 10 MG tablet Take 1 tablet (10 mg total) by mouth daily.  Marland Kitchen escitalopram (LEXAPRO) 5 MG tablet Take 1 tablet (5 mg total) by mouth daily.  . fluticasone (FLONASE) 50 MCG/ACT nasal spray Place 2 sprays into both nostrils daily.  . ondansetron (ZOFRAN ODT) 8 MG disintegrating tablet Take 1 tablet (8 mg total) by mouth every 8 (eight) hours as needed for up to 7 days for nausea or vomiting.  . pantoprazole (PROTONIX) 40 MG tablet Take 1 tablet (40 mg total) by mouth daily.  Marland Kitchen albuterol (VENTOLIN HFA) 108 (90 Base) MCG/ACT inhaler Inhale 2 puffs into the lungs every 6 (six) hours as needed for wheezing or shortness of breath. (Patient not taking: Reported on 01/24/2020)  . benzonatate (TESSALON) 200 MG capsule Take 1 capsule (200 mg total) by mouth 3 (three) times daily as needed. (Patient not taking: Reported on 01/24/2020)   No facility-administered medications prior to visit.    Review of Systems  Constitutional: Positive for fatigue.  HENT: Negative.   Respiratory: Negative.   Cardiovascular: Negative.   Gastrointestinal: Negative.   Musculoskeletal: Negative.   Neurological: Positive for dizziness, weakness and headaches.      Objective    There were no vitals taken for this visit.   Physical Exam    Patient is alert and oriented and responsive to questions Engages in conversation with provider. Speaks in full sentences without any pauses without any shortness of breath or distress.    Assessment & Plan    Non-recurrent acute serous otitis media of right ear - Plan: cephALEXin (KEFLEX) 500 MG capsule, azithromycin (ZITHROMAX) 250 MG tablet, Ciprofloxacin HCl 0.2 % otic solution  Acute otitis externa of right ear, unspecified type - Plan: cephALEXin (KEFLEX) 500 MG capsule, azithromycin (ZITHROMAX) 250 MG tablet, Ciprofloxacin HCl 0.2 % otic solution  Elevated blood pressure  reading  Meds ordered this encounter  Medications  . cephALEXin (KEFLEX) 500 MG capsule    Sig: Take 1 capsule (500 mg total) by mouth 4 (four) times daily.    Dispense:  28 capsule    Refill:  0  . azithromycin (ZITHROMAX) 250 MG tablet    Sig: PO: Take 6 tablets on day 1:Take 5 tablets day 2:Take 4 tablets day 3: Take 3 tablets day 4:Take 2 tablets day five: 5 Take 1 tablet day 6    Dispense:  6 tablet    Refill:  0  . Ciprofloxacin HCl 0.2 % otic solution    Sig: Place 0.2 mLs into the right ear 2 (two) times daily.    Dispense:  14 each    Refill:  0    Return in about 1 week (around 01/31/2020), or if symptoms worsen or fail to improve, for at any time for any worsening symptoms, Go to Emergency room/ urgent care if worse.    Red Flags discussed. The patient was given clear instructions to go to ER or return to medical center if any red flags develop, symptoms do not improve, worsen or new problems develop. They verbalized understanding.  I discussed the assessment and treatment plan with the patient. The patient was provided an opportunity to ask questions and all were answered. The patient agreed with  the plan and demonstrated an understanding of the instructions.   The patient was advised to call back or seek an in-person evaluation if the symptoms worsen or if the condition fails to improve as anticipated.  I provided 30  minutes of non-face-to-face time during this encounter. The entirety of the information documented in the History of Present Illness, Review of Systems and Physical Exam were personally obtained by me. Portions of this information were initially documented by the CMA and reviewed by me for thoroughness and accuracy.   I discussed the limitations of evaluation and management by telemedicine and the availability of in person appointments. The patient expressed understanding and agreed to proceed.  Jairo Ben, FNP [facility] [phone] (phone) [phone] (fax)  [facility]

## 2020-01-24 NOTE — Patient Instructions (Signed)
Blood Pressure Record Sheet To take your blood pressure, you will need a blood pressure machine. You can buy a blood pressure machine (blood pressure monitor) at your clinic, drug store, or online. When choosing one, consider:  An automatic monitor that has an arm cuff.  A cuff that wraps snugly around your upper arm. You should be able to fit only one finger between your arm and the cuff.  A device that stores blood pressure reading results.  Do not choose a monitor that measures your blood pressure from your wrist or finger. Follow your health care provider's instructions for how to take your blood pressure. To use this form:  Get one reading in the morning (a.m.) before you take any medicines.  Get one reading in the evening (p.m.) before supper.  Take at least 2 readings with each blood pressure check. This makes sure the results are correct. Wait 1-2 minutes between measurements.  Write down the results in the spaces on this form.  Repeat this once a week, or as told by your health care provider.  Make a follow-up appointment with your health care provider to discuss the results. Blood pressure log Date: _______________________  a.m. _____________________(1st reading) _____________________(2nd reading)  p.m. _____________________(1st reading) _____________________(2nd reading) Date: _______________________  a.m. _____________________(1st reading) _____________________(2nd reading)  p.m. _____________________(1st reading) _____________________(2nd reading) Date: _______________________  a.m. _____________________(1st reading) _____________________(2nd reading)  p.m. _____________________(1st reading) _____________________(2nd reading) Date: _______________________  a.m. _____________________(1st reading) _____________________(2nd reading)  p.m. _____________________(1st reading) _____________________(2nd reading) Date: _______________________  a.m.  _____________________(1st reading) _____________________(2nd reading)  p.m. _____________________(1st reading) _____________________(2nd reading) This information is not intended to replace advice given to you by your health care provider. Make sure you discuss any questions you have with your health care provider. Document Revised: 04/12/2019 Document Reviewed: 04/12/2019 Elsevier Patient Education  2021 Elsevier Inc. Earache, Adult An earache, or ear pain, can be caused by many things, including:  An infection.  Ear wax buildup.  Ear pressure.  Something in the ear that should not be there (foreign body).  A sore throat.  Tooth problems.  Jaw problems. Treatment of the earache will depend on the cause. If the cause is not clear or cannot be determined, you may need to watch your symptoms until your earache goes away or until a cause is found. Follow these instructions at home: Medicines  Take or apply over-the-counter and prescription medicines only as told by your health care provider.  If you were prescribed an antibiotic medicine, use it as told by your health care provider. Do not stop using the antibiotic even if you start to feel better.  Do not put anything in your ear other than medicine that is prescribed by your health care provider. Managing pain If directed, apply heat to the affected area as often as told by your health care provider. Use the heat source that your health care provider recommends, such as a moist heat pack or a heating pad.  Place a towel between your skin and the heat source.  Leave the heat on for 20-30 minutes.  Remove the heat if your skin turns bright red. This is especially important if you are unable to feel pain, heat, or cold. You may have a greater risk of getting burned. If directed, put ice on the affected area as often as told by your health care provider. To do this:  Put ice in a plastic bag.  Place a towel between your skin  and the bag.  Leave the  ice on for 20 minutes, 2-3 times a day.      General instructions  Pay attention to any changes in your symptoms.  Try resting in an upright position instead of lying down. This may help to reduce pressure in your ear and relieve pain.  Chew gum if it helps to relieve your ear pain.  Treat any allergies as told by your health care provider.  Drink enough fluid to keep your urine pale yellow.  It is up to you to get the results of any tests that were done. Ask your health care provider, or the department that is doing the tests, when your results will be ready.  Keep all follow-up visits as told by your health care provider. This is important. Contact a health care provider if:  Your pain does not improve within 2 days.  Your earache gets worse.  You have new symptoms.  You have a fever. Get help right away if you:  Have a severe headache.  Have a stiff neck.  Have trouble swallowing.  Have redness or swelling behind your ear.  Have fluid or blood coming from your ear.  Have hearing loss.  Feel dizzy. Summary  An earache, or ear pain, can be caused by many things.  Treatment of the earache will depend on the cause. Follow recommendations from your health care provider to treat your ear pain.  If the cause is not clear or cannot be determined, you may need to watch your symptoms until your earache goes away or until a cause is found.  Keep all follow-up visits as told by your health care provider. This is important. This information is not intended to replace advice given to you by your health care provider. Make sure you discuss any questions you have with your health care provider. Document Revised: 07/30/2018 Document Reviewed: 07/30/2018 Elsevier Patient Education  2021 [employer].

## 2020-01-25 ENCOUNTER — Telehealth: Payer: Self-pay

## 2020-01-25 ENCOUNTER — Encounter: Payer: Self-pay | Admitting: Adult Health

## 2020-01-25 MED ORDER — AZITHROMYCIN 250 MG PO TABS: ORAL_TABLET | ORAL | 0 refills | Status: DC

## 2020-01-25 NOTE — Telephone Encounter (Signed)
Copied from CRM [id number]. Topic: General - Other >> Jan 24, 2020  4:42 PM Gaetana Michaelis A wrote: Reason for CRM: Pharmacist from [facility] made contact with questions about the directions given alongside prescriptions for Ciprofloxacin and azithromycin (ZITHROMAX) 250 MG. Pharmacy would like clarification of directions and specified instructions.

## 2020-01-25 NOTE — Telephone Encounter (Signed)
Prescription was given to [facility] verbal order directions for sig on azithromycin were corrected and provider also resent to pharmacy  - this is completed.

## 2020-01-29 ENCOUNTER — Encounter: Payer: Self-pay | Admitting: Adult Health

## 2020-01-29 ENCOUNTER — Telehealth: Payer: Self-pay

## 2020-01-29 NOTE — Telephone Encounter (Signed)
[employer] faxed FMLA forms. Forms placed in Michelle's box and blank copy is on file with Medical Records. Please advise. Thanks TNP

## 2020-01-29 NOTE — Telephone Encounter (Signed)
Spoke with patient on the phone who states that she was not planning on taking FMLA but her job required that she file paper work since she was out of work. Patient states that she needs FMLA to excuse her out of work from 1/10-1/17. Patient states that she does have her follow up with GI scheduled. KW

## 2020-01-29 NOTE — Telephone Encounter (Signed)
Can you check with her and see if these were supposed to go to her orthopedic doctor or what she is taking FMLA for and dates needed ?She did not mention to me on video visit. Also be sure she got a follow up with GI Dr. Servando Snare.

## 2020-01-29 NOTE — Telephone Encounter (Signed)
FYI

## 2020-01-30 ENCOUNTER — Encounter: Payer: Self-pay | Admitting: Adult Health

## 2020-01-30 NOTE — Telephone Encounter (Signed)
Faxed. KW 

## 2020-01-30 NOTE — Telephone Encounter (Signed)
01/30/20 Forms completed and returned to [facility] for fax to [employer]. Provider also sent patient my chart message.

## 2020-02-05 ENCOUNTER — Ambulatory Visit (INDEPENDENT_AMBULATORY_CARE_PROVIDER_SITE_OTHER): Payer: No Typology Code available for payment source | Admitting: Adult Health

## 2020-02-05 DIAGNOSIS — Z5329 Procedure and treatment not carried out because of patient's decision for other reasons: Secondary | ICD-10-CM

## 2020-02-05 NOTE — Progress Notes (Signed)
No show

## 2020-02-07 ENCOUNTER — Encounter: Payer: Self-pay | Admitting: Adult Health

## 2020-02-07 ENCOUNTER — Other Ambulatory Visit: Payer: Self-pay

## 2020-02-07 ENCOUNTER — Other Ambulatory Visit: Payer: Self-pay | Admitting: Adult Health

## 2020-02-07 ENCOUNTER — Ambulatory Visit (INDEPENDENT_AMBULATORY_CARE_PROVIDER_SITE_OTHER): Payer: No Typology Code available for payment source | Admitting: Adult Health

## 2020-02-07 VITALS — BP 140/76 | HR 60 | Temp 97.7°F | Resp 16 | Wt 239.2 lb

## 2020-02-07 DIAGNOSIS — E559 Vitamin D deficiency, unspecified: Secondary | ICD-10-CM

## 2020-02-07 DIAGNOSIS — R8271 Bacteriuria: Secondary | ICD-10-CM | POA: Insufficient documentation

## 2020-02-07 DIAGNOSIS — I10 Essential (primary) hypertension: Secondary | ICD-10-CM

## 2020-02-07 DIAGNOSIS — H60501 Unspecified acute noninfective otitis externa, right ear: Secondary | ICD-10-CM

## 2020-02-07 DIAGNOSIS — Z8639 Personal history of other endocrine, nutritional and metabolic disease: Secondary | ICD-10-CM | POA: Diagnosis not present

## 2020-02-07 DIAGNOSIS — H6501 Acute serous otitis media, right ear: Secondary | ICD-10-CM | POA: Diagnosis not present

## 2020-02-07 MED ORDER — AMOXICILLIN-POT CLAVULANATE 875-125 MG PO TABS: 1.0000 | ORAL_TABLET | Freq: Two times a day (BID) | ORAL | 0 refills | Status: DC

## 2020-02-07 MED ORDER — AZELASTINE HCL 0.15 % NA SOLN
2.0000 | Freq: Every day | NASAL | 0 refills | Status: DC
Start: 2020-02-07 — End: 2020-02-07

## 2020-02-07 MED ORDER — CIPROFLOXACIN HCL 0.2 % OT SOLN: 0.2000 mL | Freq: Two times a day (BID) | OTIC | 0 refills | Status: DC

## 2020-02-07 MED ORDER — LEVOCETIRIZINE DIHYDROCHLORIDE 5 MG PO TABS: 5.0000 mg | ORAL_TABLET | Freq: Every evening | ORAL | 0 refills | Status: DC

## 2020-02-07 MED ORDER — HYDROCHLOROTHIAZIDE 25 MG PO TABS: 12.5000 mg | ORAL_TABLET | Freq: Every day | ORAL | 3 refills | Status: DC

## 2020-02-07 MED ORDER — PREDNISONE 10 MG (21) PO TBPK: ORAL_TABLET | ORAL | 0 refills | Status: DC

## 2020-02-07 NOTE — Progress Notes (Addendum)
Established patient visit   Patient: Bonnie Shepard   DOB: [birth year]   35 y.o. Female  MRN: [id number] Visit Date: 02/07/2020  Today's healthcare provider: Jairo Ben, FNP   Chief Complaint  Patient presents with  . Follow-up   Subjective    HPI  Follow up for non recurrent actue serous otitis  Media of right ear  The patient was last seen for this 3 weeks ago. Changes made at last visit include started patient on keflex 500mg , Zithromax 250 and Cipro drops after being seen in the emergency room for IV antibiotics for otitis media right ear and viral gastroenteritis. Covid was negative.   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   She reports excellent compliance with treatment. She feels that condition is improving, patient reports that she still hears popping sound in ear.Still has mild pain but much improved.  She is not having side effects.   Rectal bleeding has stopped, she has follow up with Gastrointestinal MD Dr. .   potassium was low in emergency room.  ----------------------------------------------------------------------------------------- Follow up for elevated blood pressure  The patient was last seen for this 3 weeks ago. Changes made at last visit include none. Blood pressure at last visit- not recorded Patient reports that recent b/p reading outside office 146/83. She does note some mild edema in the evenings some days after working at hospital all day.  She feels that condition is Improved. She is not having side effects.    Patient  denies any fever, body aches,chills, rash, chest pain, shortness of breath, nausea, vomiting, or diarrhea.  Denies dizziness, lightheadedness, pre syncopal or syncopal episodes.   -----------------------------------------------------------------------------------------   Patient Active Problem List   Diagnosis Date Noted  . Bacteria in urine 02/07/2020  . History of dehydration 02/07/2020  .  Intractable vomiting with nausea 01/17/2020  . History of foot fracture- left  10/10/2019  . Fatigue 08/09/2019  . Lymphadenopathy- posterior left side  08/09/2019  . Gastroesophageal reflux disease 07/21/2019  . History of nausea and vomiting 07/21/2019  . Epigastric burning sensation 07/21/2019  . Hematochezia   . Hemorrhoids, external, thrombosed 03/23/2019  . Rectal or anal pain 03/23/2019  . History of rectal bleeding 03/23/2019  . Trichomonas vaginitis 02/28/2019  . Vaginal high risk HPV DNA test positive 02/28/2019  . Anxiety with depression 02/23/2019  . Body mass index (BMI) of 40.1-44.9 in adult (HCC) 02/23/2019  . Left foot pain 02/23/2019  . Cervical cancer screening 02/23/2019  . Morbid obesity (HCC) 02/21/2019  . Itching in the vaginal area 02/21/2019  . History of pregnancy loss in prior pregnancy, currently pregnant in second trimester 07/10/2016  . Incontinence overflow, stress female 12/03/2015  . Nausea 01/25/2008   Past Medical History:  Diagnosis Date  . Acid reflux   . Asthma   . Family history of adverse reaction to anesthesia    Mother - PONV  . Infection    freq UTI  . Vaginal Pap smear, abnormal    f/u wnl  . Wears contact lenses    No Known Allergies     Medications: Outpatient Medications Prior to Visit  Medication Sig  . escitalopram (LEXAPRO) 10 MG tablet Take 1 tablet (10 mg total) by mouth daily.  01/27/2008 escitalopram (LEXAPRO) 5 MG tablet Take 1 tablet (5 mg total) by mouth daily.  . fluticasone (FLONASE) 50 MCG/ACT nasal spray Place 2 sprays into both nostrils daily.  . pantoprazole (PROTONIX) 40 MG tablet Take 1  tablet (40 mg total) by mouth daily.  . [DISCONTINUED] benzonatate (TESSALON) 200 MG capsule Take 1 capsule (200 mg total) by mouth 3 (three) times daily as needed.  Marland Kitchen albuterol (VENTOLIN HFA) 108 (90 Base) MCG/ACT inhaler Inhale 2 puffs into the lungs every 6 (six) hours as needed for wheezing or shortness of breath. (Patient not  taking: No sig reported)  . [DISCONTINUED] azithromycin (ZITHROMAX) 250 MG tablet By mouth Take 2 tablets day 1 (500mg  total) and 1 tablet ( 250 mg ) on days 2,3,4,5.  . [DISCONTINUED] cephALEXin (KEFLEX) 500 MG capsule Take 1 capsule (500 mg total) by mouth 4 (four) times daily.  . [DISCONTINUED] Ciprofloxacin HCl 0.2 % otic solution Place 0.2 mLs into the right ear 2 (two) times daily. (Patient not taking: Reported on 02/07/2020)   No facility-administered medications prior to visit.    Review of Systems  Constitutional: Negative.   HENT: Positive for ear pain, postnasal drip, rhinorrhea and sinus pressure (frontal. ). Negative for congestion, dental problem, drooling, ear discharge, facial swelling, hearing loss, mouth sores, nosebleeds, sinus pain, sneezing, sore throat, tinnitus, trouble swallowing and voice change.   Respiratory: Negative.   Cardiovascular: Negative.   Gastrointestinal: Negative.   Genitourinary: Negative.   Musculoskeletal: Negative.   Neurological: Positive for headaches (intermittent. ). Negative for dizziness (patient reports top of head), tremors, seizures, syncope, facial asymmetry, speech difficulty, weakness, light-headedness and numbness.  Psychiatric/Behavioral: Negative.     Last CBC Lab Results  Component Value Date   WBC 11.2 (H) 01/18/2020   HGB 14.1 01/18/2020   HCT 41.1 01/18/2020   MCV 89.7 01/18/2020   MCH 30.8 01/18/2020   RDW 12.3 01/18/2020   PLT 294 01/18/2020   Last metabolic panel Lab Results  Component Value Date   GLUCOSE 134 (H) 01/18/2020   NA 134 (L) 01/18/2020   K 2.8 (L) 01/18/2020   CL 97 (L) 01/18/2020   CO2 28 01/18/2020   BUN 13 01/18/2020   CREATININE 0.63 01/18/2020   GFRNONAA >60 01/18/2020   GFRAA >60 08/09/2019   CALCIUM 8.6 (L) 01/18/2020   PROT 8.2 (H) 01/17/2020   ALBUMIN 3.9 01/17/2020   LABGLOB 2.6 01/20/2019   AGRATIO 1.8 01/20/2019   BILITOT 0.8 01/17/2020   ALKPHOS 97 01/17/2020   AST 36 01/17/2020    ALT 52 (H) 01/17/2020   ANIONGAP 9 01/18/2020   Last lipids Lab Results  Component Value Date   CHOL 181 01/20/2019   HDL 38 (L) 01/20/2019   LDLCALC 126 (H) 01/20/2019   TRIG 92 01/20/2019   Last hemoglobin A1c Lab Results  Component Value Date   HGBA1C 5.4 01/17/2020   Last thyroid functions Lab Results  Component Value Date   TSH 0.752 01/17/2020   Last vitamin D No results found for: 25OHVITD2, 25OHVITD3, VD25OH Last vitamin B12 and Folate No results found for: VITAMINB12, FOLATE     Objective    BP 140/76   Pulse 60   Temp 97.7 F (36.5 C) (Oral)   Resp 16   Wt 239 lb 3.2 oz (108.5 kg)   SpO2 100%   BMI 42.37 kg/m  BP Readings from Last 3 Encounters:  02/07/20 140/76  01/18/20 (!) 152/72  10/10/19 140/88   Wt Readings from Last 3 Encounters:  02/07/20 239 lb 3.2 oz (108.5 kg)  01/17/20 240 lb (108.9 kg)  10/10/19 234 lb (106.1 kg)       Physical Exam Vitals and nursing note reviewed.  Constitutional:  Appearance: Normal appearance. She is not ill-appearing.  HENT:     Head: Normocephalic and atraumatic.     Jaw: There is normal jaw occlusion.     Salivary Glands: Right salivary gland is not diffusely enlarged or tender. Left salivary gland is not tender.     Comments:  Cobblestoning posterior pharynx; bilateral allergic shiners; bilateral TMs air fluid level clear; bilateral nasal turbinates mild edema erythema clear discharge;       Right Ear: Hearing, ear canal and external ear normal. No drainage, swelling or tenderness. A middle ear effusion (clear ) is present. There is no impacted cerumen. Tympanic membrane is not perforated or erythematous.     Left Ear: Hearing and external ear normal. Swelling (mild swelling in ear canal - mild erythema ) present. No drainage or tenderness. A middle ear effusion (dark fluid. ) is present. There is no impacted cerumen. Tympanic membrane is erythematous. Tympanic membrane is not perforated.     Nose:  Congestion present.     Right Turbinates: Swollen.     Left Turbinates: Swollen.     Mouth/Throat:     Lips: Pink.     Mouth: Mucous membranes are moist. No oral lesions.     Pharynx: Oropharynx is clear. Uvula midline. Posterior oropharyngeal erythema (mild ) present. No pharyngeal swelling, oropharyngeal exudate or uvula swelling.     Tonsils: No tonsillar exudate.  Eyes:     General: No scleral icterus.       Right eye: No discharge.        Left eye: No discharge.     Extraocular Movements: Extraocular movements intact.     Conjunctiva/sclera: Conjunctivae normal.     Pupils: Pupils are equal, round, and reactive to light.  Cardiovascular:     Rate and Rhythm: Normal rate and regular rhythm.     Pulses: Normal pulses.     Heart sounds: Normal heart sounds. No murmur heard. No friction rub. No gallop.   Pulmonary:     Effort: Pulmonary effort is normal. No respiratory distress.     Breath sounds: Normal breath sounds. No stridor. No wheezing, rhonchi or rales.  Chest:     Chest wall: No tenderness.  Abdominal:     General: Bowel sounds are normal. There is no distension.     Palpations: Abdomen is soft.     Tenderness: There is no abdominal tenderness. There is no guarding.  Genitourinary:    Comments: Deferred.  Musculoskeletal:        General: No tenderness. Normal range of motion.     Cervical back: Normal range of motion and neck supple.     Right lower leg: No edema.     Left lower leg: No edema.  Skin:    General: Skin is warm.     Findings: No erythema, lesion or rash.  Neurological:     Mental Status: She is alert and oriented to person, place, and time.     Motor: No weakness.     Gait: Gait normal.  Psychiatric:        Mood and Affect: Mood normal.        Behavior: Behavior normal.        Thought Content: Thought content normal.        Judgment: Judgment normal.      No results found for any visits on 02/07/20.  Assessment & Plan     Bacteria in  urine - Plan: Urinalysis, microscopic only, CULTURE, URINE COMPREHENSIVE,  CANCELED: POCT urinalysis dipstick  History of dehydration - Plan: CBC With Differential, Comprehensive Metabolic Panel (CMET)  History of low potassium - Plan: CBC With Differential, Comprehensive Metabolic Panel (CMET), Magnesium  Vitamin D deficiency - Plan: VITAMIN D 25 Hydroxy (Vit-D Deficiency, Fractures)  Non-recurrent acute serous otitis media of right ear - Plan: Azelastine HCl 0.15 % SOLN, Ciprofloxacin HCl 0.2 % otic solution, amoxicillin-clavulanate (AUGMENTIN) 875-125 MG tablet, levocetirizine (XYZAL) 5 MG tablet, hydrochlorothiazide (HYDRODIURIL) 25 MG tablet, predniSONE (STERAPRED UNI-PAK 21 TAB) 10 MG (21) TBPK tablet  Acute otitis externa of right ear, unspecified type - Plan: Ciprofloxacin HCl 0.2 % otic solution  Hypertension, unspecified type - Plan: hydrochlorothiazide (HYDRODIURIL) 25 MG tablet   Meds ordered this encounter  Medications  . Azelastine HCl 0.15 % SOLN    Sig: Place 2 sprays into the nose daily.    Dispense:  30 mL    Refill:  0  . Ciprofloxacin HCl 0.2 % otic solution    Sig: Place 0.2 mLs into the right ear 2 (two) times daily.    Dispense:  14 each    Refill:  0  . amoxicillin-clavulanate (AUGMENTIN) 875-125 MG tablet    Sig: Take 1 tablet by mouth 2 (two) times daily.    Dispense:  20 tablet    Refill:  0  . levocetirizine (XYZAL) 5 MG tablet    Sig: Take 1 tablet (5 mg total) by mouth every evening.    Dispense:  30 tablet    Refill:  0  . hydrochlorothiazide (HYDRODIURIL) 25 MG tablet    Sig: Take 0.5 tablets (12.5 mg total) by mouth daily.    Dispense:  90 tablet    Refill:  3  . predniSONE (STERAPRED UNI-PAK 21 TAB) 10 MG (21) TBPK tablet    Sig: PO: Take 6 tablets on day 1:Take 5 tablets day 2:Take 4 tablets day 3: Take 3 tablets day 4:Take 2 tablets day five: 5 Take 1 tablet day 6    Dispense:  21 tablet    Refill:  0    Orders Placed This Encounter   Procedures  . CULTURE, URINE COMPREHENSIVE  . CBC With Differential  . Comprehensive Metabolic Panel (CMET)  . VITAMIN D 25 Hydroxy (Vit-D Deficiency, Fractures)  . Magnesium  . Urinalysis, microscopic only   ENT if ear not resolving.  Monitor blood pressure at home. Labs before starting HCTZ and again recheck CMP in 2- 3 months of this one is normal.   Return in about 1 month (around 03/06/2020), or if symptoms worsen or fail to improve, for at any time for any worsening symptoms, Go to Emergency room/ urgent care if worse.     The entirety of the information documented in the History of Present Illness, Review of Systems and Physical Exam were personally obtained by me. Portions of this information were initially documented by the CMA and reviewed by me for thoroughness and accuracy.   Red Flags discussed. The patient was given clear instructions to go to ER or return to medical center if any red flags develop, symptoms do not improve, worsen or new problems develop. They verbalized understanding.    Jairo Ben, FNP  [facility] [phone] (phone) [phone] (fax)  [facility]

## 2020-02-07 NOTE — Addendum Note (Signed)
Addended by: Berniece Pap on: 02/07/2020 04:58 PM   Modules accepted: Orders

## 2020-02-07 NOTE — Patient Instructions (Signed)
Hypertension, Adult Hypertension is another name for high blood pressure. High blood pressure forces your heart to work harder to pump blood. This can cause problems over time. There are two numbers in a blood pressure reading. There is a top number (systolic) over a bottom number (diastolic). It is best to have a blood pressure that is below 120/80. Healthy choices can help lower your blood pressure, or you may need medicine to help lower it. What are the causes? The cause of this condition is not known. Some conditions may be related to high blood pressure. What increases the risk?  Smoking.  Having type 2 diabetes mellitus, high cholesterol, or both.  Not getting enough exercise or physical activity.  Being overweight.  Having too much fat, sugar, calories, or salt (sodium) in your diet.  Drinking too much alcohol.  Having long-term (chronic) kidney disease.  Having a family history of high blood pressure.  Age. Risk increases with age.  Race. You may be at higher risk if you are African American.  Gender. Men are at higher risk than women before age 45. After age 65, women are at higher risk than men.  Having obstructive sleep apnea.  Stress. What are the signs or symptoms?  High blood pressure may not cause symptoms. Very high blood pressure (hypertensive crisis) may cause: ? Headache. ? Feelings of worry or nervousness (anxiety). ? Shortness of breath. ? Nosebleed. ? A feeling of being sick to your stomach (nausea). ? Throwing up (vomiting). ? Changes in how you see. ? Very bad chest pain. ? Seizures. How is this treated?  This condition is treated by making healthy lifestyle changes, such as: ? Eating healthy foods. ? Exercising more. ? Drinking less alcohol.  Your health care provider may prescribe medicine if lifestyle changes are not enough to get your blood pressure under control, and if: ? Your top number is above 130. ? Your bottom number is above  80.  Your personal target blood pressure may vary. Follow these instructions at home: Eating and drinking  If told, follow the DASH eating plan. To follow this plan: ? Fill one half of your plate at each meal with fruits and vegetables. ? Fill one fourth of your plate at each meal with whole grains. Whole grains include whole-wheat pasta, brown rice, and whole-grain bread. ? Eat or drink low-fat dairy products, such as skim milk or low-fat yogurt. ? Fill one fourth of your plate at each meal with low-fat (lean) proteins. Low-fat proteins include fish, chicken without skin, eggs, beans, and tofu. ? Avoid fatty meat, cured and processed meat, or chicken with skin. ? Avoid pre-made or processed food.  Eat less than 1,500 mg of salt each day.  Do not drink alcohol if: ? Your doctor tells you not to drink. ? You are pregnant, may be pregnant, or are planning to become pregnant.  If you drink alcohol: ? Limit how much you use to:  0-1 drink a day for women.  0-2 drinks a day for men. ? Be aware of how much alcohol is in your drink. In the [place] one drink equals one 12 oz bottle of beer (355 mL), one 5 oz glass of wine (148 mL), or one 1 oz glass of hard liquor (44 mL).   Lifestyle  Work with your doctor to stay at a healthy weight or to lose weight. Ask your doctor what the best weight is for you.  Get at least 30 minutes of exercise most   days of the week. This may include walking, swimming, or biking.  Get at least 30 minutes of exercise that strengthens your muscles (resistance exercise) at least 3 days a week. This may include lifting weights or doing Pilates.  Do not use any products that contain nicotine or tobacco, such as cigarettes, e-cigarettes, and chewing tobacco. If you need help quitting, ask your doctor.  Check your blood pressure at home as told by your doctor.  Keep all follow-up visits as told by your doctor. This is important.   Medicines  Take over-the-counter  and prescription medicines only as told by your doctor. Follow directions carefully.  Do not skip doses of blood pressure medicine. The medicine does not work as well if you skip doses. Skipping doses also puts you at risk for problems.  Ask your doctor about side effects or reactions to medicines that you should watch for. Contact a doctor if you:  Think you are having a reaction to the medicine you are taking.  Have headaches that keep coming back (recurring).  Feel dizzy.  Have swelling in your ankles.  Have trouble with your vision. Get help right away if you:  Get a very bad headache.  Start to feel mixed up (confused).  Feel weak or numb.  Feel faint.  Have very bad pain in your: ? Chest. ? Belly (abdomen).  Throw up more than once.  Have trouble breathing. Summary  Hypertension is another name for high blood pressure.  High blood pressure forces your heart to work harder to pump blood.  For most people, a normal blood pressure is less than 120/80.  Making healthy choices can help lower blood pressure. If your blood pressure does not get lower with healthy choices, you may need to take medicine. This information is not intended to replace advice given to you by your health care provider. Make sure you discuss any questions you have with your health care provider. Document Revised: 09/01/2017 Document Reviewed: 09/01/2017 Elsevier Patient Education  2021 [employer] Otitis Media, Adult  Otitis media is a condition in which the middle ear is red and swollen (inflamed) and full of fluid. The middle ear is the part of the ear that contains bones for hearing as well as air that helps send sounds to the brain. The condition usually goes away on its own. What are the causes? This condition is caused by a blockage in the eustachian tube. The eustachian tube connects the middle ear to the back of the nose. It normally allows air into the middle ear. The blockage is  caused by fluid or swelling. Problems that can cause blockage include:  A cold or infection that affects the nose, mouth, or throat.  Allergies.  An irritant, such as tobacco smoke.  Adenoids that have become large. The adenoids are soft tissue located in the back of the throat, behind the nose and the roof of the mouth.  Growth or swelling in the upper part of the throat, just behind the nose (nasopharynx).  Damage to the ear caused by change in pressure. This is called barotrauma. What are the signs or symptoms? Symptoms of this condition include:  Ear pain.  Fever.  Problems with hearing.  Being tired.  Fluid leaking from the ear.  Ringing in the ear. How is this treated? This condition can go away on its own within 3-5 days. But if the condition is caused by bacteria or does not go away on its own, or if it  keeps coming back, your doctor may:  Give you antibiotic medicines.  Give you medicines for pain. Follow these instructions at home:  Take over-the-counter and prescription medicines only as told by your doctor.  If you were prescribed an antibiotic medicine, take it as told by your doctor. Do not stop taking the antibiotic even if you start to feel better.  Keep all follow-up visits as told by your doctor. This is important. Contact a doctor if:  You have bleeding from your nose.  There is a lump on your neck.  You are not feeling better in 5 days.  You feel worse instead of better. Get help right away if:  You have pain that is not helped with medicine.  You have swelling, redness, or pain around your ear.  You get a stiff neck.  You cannot move part of your face (paralysis).  You notice that the bone behind your ear hurts when you touch it.  You get a very bad headache. Summary  Otitis media means that the middle ear is red, swollen, and full of fluid.  This condition usually goes away on its own.  If the problem does not go away,  treatment may be needed. You may be given medicines to treat the infection or to treat your pain.  If you were prescribed an antibiotic medicine, take it as told by your doctor. Do not stop taking the antibiotic even if you start to feel better.  Keep all follow-up visits as told by your doctor. This is important. This information is not intended to replace advice given to you by your health care provider. Make sure you discuss any questions you have with your health care provider. Document Revised: 11/24/2018 Document Reviewed: 11/24/2018 Elsevier Patient Education  2021 Else[employer] Inc. Azelastine nasal spray What is this medicine? AZELASTINE (a ZEL as teen) nasal spray is a histamine blocker. It helps to relieve itchy, stuffy, and runny nose from allergies and other irritants. This medicine may be used for other purposes; ask your health care provider or pharmacist if you have questions. COMMON BRAND NAME(S): Astelin, Astepro What should I tell my health care provider before I take this medicine? They need to know if you have any of these conditions:  an unusual or allergic reaction to azelastine, other medicines, foods, dyes, or preservatives  pregnant or trying to become pregnant  breast-feeding How should I use this medicine? This medicine is for use only in the nose. Follow the directions on the prescription or product label. Do not use more often than directed. Make sure that you are using your nasal spray correctly. Ask you doctor or health care provider if you have any questions. This medicine comes with INSTRUCTIONS FOR USE. Ask your pharmacist for directions on how to use this medicine. Read the information carefully. Talk to your pharmacist or health care provider if you have questions. Talk to your health care provider about the use of this medicine in children. While some products may be prescribed for children as young as 6 months for selected conditions, precautions do  apply. Overdosage: If you think you have taken too much of this medicine contact a poison control center or emergency room at once. NOTE: This medicine is only for you. Do not share this medicine with others. What if I miss a dose? If you miss a dose, use it as soon as you can. If it is almost time for your next dose, use only that dose. Do not use  double or extra doses. What may interact with this medicine?  cimetidine  other antihistamines This list may not describe all possible interactions. Give your health care provider a list of all the medicines, herbs, non-prescription drugs, or dietary supplements you use. Also tell them if you smoke, drink alcohol, or use illegal drugs. Some items may interact with your medicine. What should I watch for while using this medicine? Visit your health care provider for regular checks on your progress. Tell your health care provider if your symptoms do not start to get better or if they get worse. You may get drowsy or dizzy. Do not drive, use machinery, or do anything that needs mental alertness until you know how this medicine affects you. Alcohol may interfere with the effects of this medicine. Limit alcoholic drinks. What side effects may I notice from receiving this medicine? Side effects that you should report to your doctor or health care professional as soon as possible:  allergic reactions like skin rash, itching or hives, swelling of the face, lips, or tongue  infection (fever, chills, cough, sore throat, sinus pain) Side effects that usually do not require medical attention (report to your doctor or health care professional if they continue or are bothersome):  change in taste  drowsiness  headache  nose or throat irritation  nosebleed  runny nose  sneezing This list may not describe all possible side effects. Call your doctor for medical advice about side effects. You may report side effects to FDA at 1-800-FDA-1088. Where should  I keep my medicine? Keep out of the reach of children and pets. Store upright at room temperature between 20 and 25 degrees C (68 and 77 degrees F). Do not freeze. Get rid of any unused medicine after the expiration date. To get rid of medicines that are no longer needed or have expired:  Take the medicine to a medicine take-back program. Check with your pharmacy or law enforcement to find a location.  If you cannot return the medicine, ask your pharmacist or health care provider how to get rid of this medicine safely. NOTE: This sheet is a summary. It may not cover all possible information. If you have questions about this medicine, talk to your doctor, pharmacist, or health care provider.  2021 Elsevier/Gold Standard (2019-06-28 14:48:32) Allergic Rhinitis, Adult Allergic rhinitis is a reaction to allergens. Allergens are things that can cause an allergic reaction. This condition affects the lining inside the nose (mucous membrane). There are two types of allergic rhinitis:  Seasonal. This type is also called hay fever. It happens only during some times of the year.  Perennial. This type can happen at any time of the year. This condition cannot be spread from person to person (is not contagious). It can be mild, worse, or very bad. It can develop at any age and may be outgrown. What are the causes? This condition may be caused by:  Pollen from grasses, trees, and weeds.  Dust mites.  Smoke.  Mold.  Car fumes.  The pee (urine), spit, or dander of pets. Dander is dead skin cells from a pet.   What increases the risk? You are more likely to develop this condition if:  You have allergies in your family.  You have problems like allergies in your family. You may have: ? Swelling of parts of your eyes and eyelids. ? Asthma. This affects how you breathe. ? Long-term redness and swelling on your skin. ? Food allergies. What are the signs  or symptoms? The main symptom of this  condition is a runny or stuffy nose (nasal congestion). Other symptoms may include:  Sneezing or coughing.  Itching and tearing of your eyes.  Mucus that drips down the back of your throat (postnasal drip).  Trouble sleeping.  Feeling tired.  Headache.  Sore throat. How is this treated? There is no cure for this condition. You should avoid things that you are allergic to. Treatment can help to relieve symptoms. This may include:  Medicines that block allergy symptoms, such as corticosteroids or antihistamines. These may be given as a shot, nasal spray, or pill.  Avoiding things you are allergic to.  Medicines that give you bits of what you are allergic to over time. This is called immunotherapy. It is done if other treatments do not help. You may get: ? Shots. ? Medicine under your tongue.  Stronger medicines, if other treatments do not help. Follow these instructions at home: Avoiding allergens Find out what things you are allergic to and avoid them. To do this, try these things:  If you get allergies any time of year: ? Replace carpet with wood, tile, or vinyl flooring. Carpet can trap pet dander and dust. ? Do not smoke. Do not allow smoking in your home. ? Change your heating and air conditioning filters at least once a month.  If you get allergies only some times of the year: ? Keep windows closed when you can. ? Plan things to do outside when pollen counts are lowest. Check pollen counts before you plan things to do outside. ? When you come indoors, change your clothes and shower before you sit on furniture or bedding.   If you are allergic to a pet: ? Keep the pet out of your bedroom. ? Vacuum, sweep, and dust often.   General instructions  Take over-the-counter and prescription medicines only as told by your doctor.  Drink enough fluid to keep your pee (urine) pale yellow.  Keep all follow-up visits as told by your doctor. This is important. Where to find  more information  American Academy of Allergy, Asthma & Immunology: www.aaaai.org Contact a doctor if:  You have a fever.  You get a cough that does not go away.  You make whistling sounds when you breathe (wheeze).  Your symptoms slow you down.  Your symptoms stop you from doing your normal things each day. Get help right away if:  You are short of breath. This symptom may be an emergency. Do not wait to see if the symptom will go away. Get medical help right away. Call your local emergency services (911 in the [place] Do not drive yourself to the hospital. Summary  Allergic rhinitis may be treated by taking medicines and avoiding things you are allergic to.  If you have allergies only some of the year, keep windows closed when you can at those times.  Contact your doctor if you get a fever or a cough that does not go away. This information is not intended to replace advice given to you by your health care provider. Make sure you discuss any questions you have with your health care provider. Document Revised: 02/13/2019 Document Reviewed: 12/20/2018 Elsevier Patient Education  2021 [employer].

## 2020-02-08 ENCOUNTER — Other Ambulatory Visit: Payer: Self-pay | Admitting: Adult Health

## 2020-02-08 DIAGNOSIS — E559 Vitamin D deficiency, unspecified: Secondary | ICD-10-CM

## 2020-02-08 LAB — COMPREHENSIVE METABOLIC PANEL
ALT: 64 IU/L — ABNORMAL HIGH (ref 0–32)
AST: 40 IU/L (ref 0–40)
Albumin/Globulin Ratio: 1.6 (ref 1.2–2.2)
Albumin: 4.5 g/dL (ref 3.8–4.8)
Alkaline Phosphatase: 96 IU/L (ref 44–121)
BUN/Creatinine Ratio: 13 (ref 9–23)
BUN: 12 mg/dL (ref 6–20)
Bilirubin Total: <0.2 mg/dL (ref 0.0–1.2)
CO2: 27 mmol/L (ref 20–29)
Calcium: 9.9 mg/dL (ref 8.7–10.2)
Chloride: 105 mmol/L (ref 96–106)
Creatinine, Ser: 0.89 mg/dL (ref 0.57–1.00)
GFR calc Af Amer: 98 mL/min/{1.73_m2} (ref >59)
GFR calc non Af Amer: 85 mL/min/{1.73_m2} (ref >59)
Globulin, Total: 2.9 g/dL (ref 1.5–4.5)
Glucose: 92 mg/dL (ref 65–99)
Potassium: 4.6 mmol/L (ref 3.5–5.2)
Sodium: 145 mmol/L — ABNORMAL HIGH (ref 134–144)
Total Protein: 7.4 g/dL (ref 6.0–8.5)

## 2020-02-08 LAB — CBC WITH DIFFERENTIAL
Basophils Absolute: 0 10*3/uL (ref 0.0–0.2)
Basos: 1 %
EOS (ABSOLUTE): 0.2 10*3/uL (ref 0.0–0.4)
Eos: 2 %
Hematocrit: 41.1 % (ref 34.0–46.6)
Hemoglobin: 13.7 g/dL (ref 11.1–15.9)
Immature Grans (Abs): 0 10*3/uL (ref 0.0–0.1)
Immature Granulocytes: 0 %
Lymphocytes Absolute: 3.3 10*3/uL — ABNORMAL HIGH (ref 0.7–3.1)
Lymphs: 45 %
MCH: 31 pg (ref 26.6–33.0)
MCHC: 33.3 g/dL (ref 31.5–35.7)
MCV: 93 fL (ref 79–97)
Monocytes Absolute: 0.5 10*3/uL (ref 0.1–0.9)
Monocytes: 8 %
Neutrophils Absolute: 3.2 10*3/uL (ref 1.4–7.0)
Neutrophils: 44 %
RBC: 4.42 x10E6/uL (ref 3.77–5.28)
RDW: 11.8 % (ref 11.7–15.4)
WBC: 7.2 10*3/uL (ref 3.4–10.8)

## 2020-02-08 LAB — MAGNESIUM: Magnesium: 1.8 mg/dL (ref 1.6–2.3)

## 2020-02-08 LAB — VITAMIN D 25 HYDROXY (VIT D DEFICIENCY, FRACTURES): Vit D, 25-Hydroxy: 24.4 ng/mL — ABNORMAL LOW (ref 30.0–100.0)

## 2020-02-08 MED ORDER — VITAMIN D (ERGOCALCIFEROL) 1.25 MG (50000 UNIT) PO CAPS: 50000.0000 [IU] | ORAL_CAPSULE | ORAL | 0 refills | Status: DC

## 2020-02-08 NOTE — Progress Notes (Signed)
Meds ordered this encounter  Medications  . Vitamin D, Ergocalciferol, (DRISDOL) 1.25 MG (50000 UNIT) CAPS capsule    Sig: Take 1 capsule (50,000 Units total) by mouth every 7 (seven) days. (taking one tablet per week) walk in lab in office 1-2 weeks after completing prescription.    Dispense:  12 capsule    Refill:  0   Orders Placed This Encounter  Procedures  . VITAMIN D 25 Hydroxy (Vit-D Deficiency, Fractures)   

## 2020-02-08 NOTE — Progress Notes (Signed)
CBC is ok, lymphocytes mild elevation, complete antibiotics as prescribed. CMP stable ALT still elevated, but stable, keep follow up with GI already scheduled, avoid alcohol and any excessive tylenol.   Vitamin  D is low, this can contribute to poor sleep and fatigue, will send in prescription for Vitamin D at 50,000 units by mouth once every 7 days/(once weekly) for 12 weeks. Advise recheck lab Vitamin D in 1-2 weeks after completing vitamin d prescription. Lab iis walk in and is closed during lunch during regular office hours.   Meds ordered this encounter Medications  Vitamin D, Ergocalciferol, (DRISDOL) 1.25 MG (50000 UNIT) CAPS capsule   Sig: Take 1 capsule (50,000 Units total) by mouth every 7 (seven) days. (taking one tablet per week) walk in lab in office 1-2 weeks after completing prescription.   Dispense:  12 capsule   Refill:  0

## 2020-02-13 ENCOUNTER — Ambulatory Visit (INDEPENDENT_AMBULATORY_CARE_PROVIDER_SITE_OTHER): Payer: No Typology Code available for payment source | Admitting: Gastroenterology

## 2020-02-13 ENCOUNTER — Other Ambulatory Visit: Payer: Self-pay

## 2020-02-13 ENCOUNTER — Encounter: Payer: Self-pay | Admitting: Gastroenterology

## 2020-02-13 VITALS — BP 145/89 | HR 60 | Temp 97.4°F | Ht 63.0 in | Wt 237.0 lb

## 2020-02-13 DIAGNOSIS — K641 Second degree hemorrhoids: Secondary | ICD-10-CM

## 2020-02-13 NOTE — Progress Notes (Signed)
Arlyss Repress, MD [address]  [address]  [place], [place] [address]  Main: [phone]  Fax: [phone] Pager: [phone]   Primary Care Physician: Berniece Pap, FNP  Primary Gastroenterologist:  Dr. Midge Minium  Chief Complaint  Patient presents with  . Hemorrhoids    Patient is having banding procedure     HPI: Bonnie Shepard is a 35 y.o. female is referred by Dr. Servando Snare to discuss about hemorrhoid ligation for history of rectal bleeding secondary to internal hemorrhoids  Patient reports that for last 1 year, she has been experiencing hemorrhoidal symptoms including rectal bleeding, itching, burning, discomfort as well as prolapse after the bowel movement and spontaneously reduce.  She experiences flareups at least once a month.  She uses Preparation H with temporary relief.  Her job also involves lifting heavy boxes.  She is trying to keep her bowels regular.  Current Outpatient Medications  Medication Sig Dispense Refill  . albuterol (VENTOLIN HFA) 108 (90 Base) MCG/ACT inhaler Inhale 2 puffs into the lungs every 6 (six) hours as needed for wheezing or shortness of breath. 8 g 0  . amoxicillin-clavulanate (AUGMENTIN) 875-125 MG tablet Take 1 tablet by mouth 2 (two) times daily. 20 tablet 0  . Azelastine HCl 0.15 % SOLN Place 2 sprays into the nose daily. 30 mL 0  . Ciprofloxacin HCl 0.2 % otic solution Place 0.2 mLs into the right ear 2 (two) times daily. 14 each 0  . escitalopram (LEXAPRO) 10 MG tablet Take 1 tablet (10 mg total) by mouth daily. 90 tablet 1  . escitalopram (LEXAPRO) 5 MG tablet Take 1 tablet (5 mg total) by mouth daily. 90 tablet 1  . fluticasone (FLONASE) 50 MCG/ACT nasal spray Place 2 sprays into both nostrils daily. 16 g 6  . levocetirizine (XYZAL) 5 MG tablet Take 1 tablet (5 mg total) by mouth every evening. 30 tablet 0  . pantoprazole (PROTONIX) 40 MG tablet Take 1 tablet (40 mg total) by mouth daily. 90 tablet 3  . predniSONE  (STERAPRED UNI-PAK 21 TAB) 10 MG (21) TBPK tablet PO: Take 6 tablets on day 1:Take 5 tablets day 2:Take 4 tablets day 3: Take 3 tablets day 4:Take 2 tablets day five: 5 Take 1 tablet day 6 21 tablet 0  . Vitamin D, Ergocalciferol, (DRISDOL) 1.25 MG (50000 UNIT) CAPS capsule Take 1 capsule (50,000 Units total) by mouth every 7 (seven) days. (taking one tablet per week) walk in lab in office 1-2 weeks after completing prescription. 12 capsule 0  . hydrochlorothiazide (HYDRODIURIL) 25 MG tablet Take 0.5 tablets (12.5 mg total) by mouth daily. (Patient not taking: Reported on 02/13/2020) 90 tablet 3   No current facility-administered medications for this visit.    Allergies as of 02/13/2020  . (No Known Allergies)    NSAIDs: None  Antiplts/Anticoagulants/Anti thrombotics: None  GI procedures:  Colonoscopy 04/03/2019 - Non-bleeding internal hemorrhoids. - Diverticulosis in the sigmoid colon. - No specimens collected.  ROS:  General: Negative for anorexia, weight loss, fever, chills, fatigue, weakness. ENT: Negative for hoarseness, difficulty swallowing , nasal congestion. CV: Negative for chest pain, angina, palpitations, dyspnea on exertion, peripheral edema.  Respiratory: Negative for dyspnea at rest, dyspnea on exertion, cough, sputum, wheezing.  GI: See history of present illness. GU:  Negative for dysuria, hematuria, urinary incontinence, urinary frequency, nocturnal urination.  Endo: Negative for unusual weight change.    Physical Examination:   BP (!) 145/89 (BP Location: Left Arm, Patient Position: Sitting,  Cuff Size: Normal)   Pulse 60   Temp (!) 97.4 F (36.3 C) (Oral)   Ht 5\' 3"  (1.6 m)   Wt 237 lb (107.5 kg)   BMI 41.98 kg/m   General: Well-nourished, well-developed in no acute distress.  Eyes: No icterus. Conjunctivae pink. Mouth: Oropharyngeal mucosa moist and pink , no lesions erythema or exudate. Lungs: Clear to auscultation bilaterally. Non-labored. Heart:  Regular rate and rhythm, no murmurs rubs or gallops.  Abdomen: Bowel sounds are normal, nontender, nondistended, no hepatosplenomegaly or masses, no hernia , no rebound or guarding.   Rectum: Normal perianal exam, nontender digital rectal exam, palpable hemorrhoids Extremities: No lower extremity edema. No clubbing or deformities. Neuro: Alert and oriented x 3.  Grossly intact. Skin: Warm and dry, no jaundice.   Psych: Alert and cooperative, normal mood and affect.   Imaging Studies: CT ABDOMEN PELVIS W CONTRAST  Result Date: 01/17/2020 CLINICAL DATA:  35 year old female with abdominal pain. EXAM: CT ABDOMEN AND PELVIS WITH CONTRAST TECHNIQUE: Multidetector CT imaging of the abdomen and pelvis was performed using the standard protocol following bolus administration of intravenous contrast. CONTRAST:  20 OMNIPAQUE IOHEXOL 300 MG/ML  SOLN COMPARISON:  CT abdomen pelvis dated 06/08/2019. FINDINGS: Lower chest: The visualized lung bases are clear. No intra-abdominal free air or free fluid. Hepatobiliary: Fatty liver. No intrahepatic biliary dilatation. The gallbladder is unremarkable. Pancreas: Unremarkable. No pancreatic ductal dilatation or surrounding inflammatory changes. Spleen: Normal in size without focal abnormality. Adrenals/Urinary Tract: The adrenal glands unremarkable. Subcentimeter left renal interpolar hypodense lesion is too small to characterize. There is no hydronephrosis on either side. The visualized ureters and urinary bladder appear unremarkable. Stomach/Bowel: Small scattered sigmoid diverticula without active inflammatory changes. There is no bowel obstruction or active inflammation. The appendix is normal. Vascular/Lymphatic: The abdominal aorta and IVC unremarkable. No portal venous gas. There is no adenopathy. Reproductive: The uterus is anteverted and grossly unremarkable. No adnexal masses. Other: None Musculoskeletal: No acute or significant osseous findings. IMPRESSION: 1. No  acute intra-abdominal or pelvic pathology. 2. Fatty liver. 3. Small scattered sigmoid diverticula. No bowel obstruction. Normal appendix. Electronically Signed   By: 08/08/2019 M.D.   On: 01/17/2020 16:02   DG Chest Portable 1 View  Result Date: 01/17/2020 CLINICAL DATA:  Shortness of breath and fatigue EXAM: PORTABLE CHEST 1 VIEW COMPARISON:  None. FINDINGS: The lungs are clear. Heart is upper normal in size with pulmonary vascularity normal. No adenopathy. No bone lesions. IMPRESSION: Lungs clear.  Heart upper normal in size. Electronically Signed   By: 03/16/2020 III M.D.   On: 01/17/2020 14:05    Assessment and Plan:   Bonnie Shepard is a 35 y.o. female with no significant past medical history is seen in consultation for grade 2 symptomatic hemorrhoids, rectal bleeding secondary to internal hemorrhoids  I have discussed about the hemorrhoid ligation procedure, risks and benefits Consent obtained, will proceed with hemorrhoid ligation today  Follow up in 2 weeks   Dr 20, MD

## 2020-03-06 ENCOUNTER — Ambulatory Visit (INDEPENDENT_AMBULATORY_CARE_PROVIDER_SITE_OTHER): Payer: No Typology Code available for payment source | Admitting: Gastroenterology

## 2020-03-06 ENCOUNTER — Encounter: Payer: Self-pay | Admitting: Gastroenterology

## 2020-03-06 ENCOUNTER — Other Ambulatory Visit: Payer: Self-pay

## 2020-03-06 ENCOUNTER — Other Ambulatory Visit
Admission: RE | Admit: 2020-03-06 | Discharge: 2020-03-06 | Disposition: A | Discharge status: Home / Self Care | Payer: No Typology Code available for payment source | Attending: Adult Health | Admitting: Adult Health

## 2020-03-06 ENCOUNTER — Other Ambulatory Visit: Payer: Self-pay | Admitting: Adult Health

## 2020-03-06 VITALS — BP 147/88 | HR 61 | Temp 97.7°F | Wt 237.4 lb

## 2020-03-06 DIAGNOSIS — K641 Second degree hemorrhoids: Secondary | ICD-10-CM

## 2020-03-06 DIAGNOSIS — E559 Vitamin D deficiency, unspecified: Secondary | ICD-10-CM

## 2020-03-06 LAB — URINALYSIS, COMPLETE (UACMP) WITH MICROSCOPIC
Bacteria, UA: NONE SEEN
Bilirubin Urine: NEGATIVE
Glucose, UA: NEGATIVE mg/dL
Ketones, ur: NEGATIVE mg/dL
Leukocytes,Ua: NEGATIVE
Nitrite: NEGATIVE
Protein, ur: NEGATIVE mg/dL
Specific Gravity, Urine: 1.017 (ref 1.005–1.030)
pH: 6 (ref 5.0–8.0)

## 2020-03-06 LAB — VITAMIN D 25 HYDROXY (VIT D DEFICIENCY, FRACTURES): Vit D, 25-Hydroxy: 35.8 ng/mL (ref 30–100)

## 2020-03-06 MED ORDER — VITAMIN D (ERGOCALCIFEROL) 1.25 MG (50000 UNIT) PO CAPS: 50000.0000 [IU] | ORAL_CAPSULE | ORAL | 1 refills | Status: DC

## 2020-03-06 NOTE — Progress Notes (Signed)
Vitamin d lab within normal limits wail repast vitamin d prescription sent to pharmacy.  Please add on urine culture for urine results. Verify if patient was on menses.   Vitamin  D is low, this can contribute to poor sleep and fatigue, will send in prescription for Vitamin D at 50,000 units by mouth once every 7 days/(once weekly) for 12 weeks. Advise recheck lab Vitamin D in 1-2 weeks after completing vitamin d prescription. Lab iis walk in and is closed during lunch during regular office hours.

## 2020-03-06 NOTE — Progress Notes (Signed)
PROCEDURE NOTE: The patient presents with symptomatic grade 2 hemorrhoids, unresponsive to maximal medical therapy, requesting rubber band ligation of his/her hemorrhoidal disease.  All risks, benefits and alternative forms of therapy were described and informed consent was obtained.  In the Left Lateral Decubitus position (if anoscopy is performed) anoscopic examination revealed grade 2 hemorrhoids in the RA and RP position(s).   The decision was made to band the RA internal hemorrhoid, and the [facility]egan System was used to perform band ligation without complication.  Digital anorectal examination was then performed to assure proper positioning of the band, and to adjust the banded tissue as required.  The patient was discharged home without pain or other issues.  Dietary and behavioral recommendations were given and (if necessary - prescriptions were given), along with follow-up instructions.  The patient will return 2 weeks for follow-up and possible additional banding as required.  No complications were encountered and the patient tolerated the procedure well.

## 2020-03-06 NOTE — Progress Notes (Signed)

## 2020-03-07 LAB — URINE CULTURE: Culture: NO GROWTH

## 2020-03-07 NOTE — Progress Notes (Signed)
No growth on urine culture. No treatment warranted.

## 2020-03-19 ENCOUNTER — Ambulatory Visit (INDEPENDENT_AMBULATORY_CARE_PROVIDER_SITE_OTHER): Payer: No Typology Code available for payment source | Admitting: Gastroenterology

## 2020-03-19 ENCOUNTER — Encounter: Payer: Self-pay | Admitting: Gastroenterology

## 2020-03-19 ENCOUNTER — Other Ambulatory Visit: Payer: Self-pay

## 2020-03-19 VITALS — BP 142/87 | HR 92 | Temp 97.9°F | Ht 63.0 in | Wt 237.0 lb

## 2020-03-19 DIAGNOSIS — K641 Second degree hemorrhoids: Secondary | ICD-10-CM

## 2020-03-19 NOTE — Progress Notes (Signed)
PROCEDURE NOTE: The patient presents with symptomatic grade 2 hemorrhoids, unresponsive to maximal medical therapy, requesting rubber band ligation of his/her hemorrhoidal disease.  All risks, benefits and alternative forms of therapy were described and informed consent was obtained.  The decision was made to band the LL internal hemorrhoid, and the [facility] System was used to perform band ligation without complication.  Digital anorectal examination was then performed to assure proper positioning of the band, and to adjust the banded tissue as required.  The patient was discharged home without pain or other issues.  Dietary and behavioral recommendations were given and (if necessary - prescriptions were given), along with follow-up instructions.  The patient will return as needed for follow-up and possible additional banding as required.  No complications were encountered and the patient tolerated the procedure well.   

## 2020-03-27 ENCOUNTER — Encounter: Payer: Self-pay | Admitting: Adult Health

## 2020-03-27 ENCOUNTER — Other Ambulatory Visit: Payer: Self-pay

## 2020-03-27 DIAGNOSIS — R112 Nausea with vomiting, unspecified: Secondary | ICD-10-CM

## 2020-03-28 ENCOUNTER — Telehealth (INDEPENDENT_AMBULATORY_CARE_PROVIDER_SITE_OTHER): Payer: No Typology Code available for payment source | Admitting: Adult Health

## 2020-03-28 ENCOUNTER — Encounter: Payer: Self-pay | Admitting: Adult Health

## 2020-03-28 ENCOUNTER — Other Ambulatory Visit: Payer: Self-pay | Admitting: Adult Health

## 2020-03-28 DIAGNOSIS — H6501 Acute serous otitis media, right ear: Secondary | ICD-10-CM | POA: Insufficient documentation

## 2020-03-28 DIAGNOSIS — K219 Gastro-esophageal reflux disease without esophagitis: Secondary | ICD-10-CM

## 2020-03-28 DIAGNOSIS — K5792 Diverticulitis of intestine, part unspecified, without perforation or abscess without bleeding: Secondary | ICD-10-CM | POA: Diagnosis not present

## 2020-03-28 DIAGNOSIS — R197 Diarrhea, unspecified: Secondary | ICD-10-CM

## 2020-03-28 DIAGNOSIS — Z20822 Contact with and (suspected) exposure to covid-19: Secondary | ICD-10-CM | POA: Insufficient documentation

## 2020-03-28 DIAGNOSIS — R110 Nausea: Secondary | ICD-10-CM

## 2020-03-28 DIAGNOSIS — J309 Allergic rhinitis, unspecified: Secondary | ICD-10-CM | POA: Insufficient documentation

## 2020-03-28 DIAGNOSIS — I10 Essential (primary) hypertension: Secondary | ICD-10-CM | POA: Insufficient documentation

## 2020-03-28 MED ORDER — ONDANSETRON HCL 8 MG PO TABS: 8.0000 mg | ORAL_TABLET | Freq: Three times a day (TID) | ORAL | 0 refills | Status: DC | PRN

## 2020-03-28 MED ORDER — PANTOPRAZOLE SODIUM 40 MG PO TBEC: 40.0000 mg | DELAYED_RELEASE_TABLET | Freq: Every day | ORAL | 3 refills | Status: DC

## 2020-03-28 MED ORDER — FLUTICASONE PROPIONATE 50 MCG/ACT NA SUSP
2.0000 | Freq: Every day | NASAL | 6 refills | Status: DC
Start: 2020-03-28 — End: 2020-03-28

## 2020-03-28 MED ORDER — AMOXICILLIN-POT CLAVULANATE 875-125 MG PO TABS
1.0000 | ORAL_TABLET | Freq: Two times a day (BID) | ORAL | 0 refills | Status: DC
Start: 2020-03-28 — End: 2020-03-28

## 2020-03-28 MED ORDER — ESCITALOPRAM OXALATE 5 MG PO TABS: 5.0000 mg | ORAL_TABLET | Freq: Every day | ORAL | 1 refills | Status: DC

## 2020-03-28 MED ORDER — ALBUTEROL SULFATE HFA 108 (90 BASE) MCG/ACT IN AERS: 2.0000 | INHALATION_SPRAY | Freq: Four times a day (QID) | RESPIRATORY_TRACT | 0 refills | Status: DC | PRN

## 2020-03-28 MED ORDER — LEVOCETIRIZINE DIHYDROCHLORIDE 5 MG PO TABS: 5.0000 mg | ORAL_TABLET | Freq: Every evening | ORAL | 3 refills | Status: DC

## 2020-03-28 MED ORDER — HYDROCHLOROTHIAZIDE 25 MG PO TABS: 12.5000 mg | ORAL_TABLET | Freq: Every day | ORAL | 3 refills | Status: DC

## 2020-03-28 NOTE — Patient Instructions (Signed)
Diverticulitis  Diverticulitis is when small pouches in your colon (large intestine) get infected or swollen. This causes pain in the belly (abdomen) and watery poop (diarrhea). These pouches are called diverticula. The pouches form in people who have a condition called diverticulosis. What are the causes? This condition may be caused by poop (stool) that gets trapped in the pouches in your colon. The poop lets germs (bacteria) grow in the pouches. This causes the infection. What increases the risk? You are more likely to get this condition if you have small pouches in your colon. The risk is higher if:  You are overweight or very overweight (obese).  You do not exercise enough.  You drink alcohol.  You smoke or use products with tobacco in them.  You eat a diet that has a lot of red meat such as beef, pork, or lamb.  You eat a diet that does not have enough fiber in it.  You are older than 35 years of age. What are the signs or symptoms?  Pain in the belly. Pain is often on the left side, but it may be in other areas.  Fever and feeling cold.  Feeling like you may vomit.  Vomiting.  Having cramps.  Feeling full.  Changes to how often you poop.  Blood in your poop. How is this treated? Most cases are treated at home by:  Taking over-the-counter pain medicines.  Following a clear liquid diet.  Taking antibiotic medicines.  Resting. Very bad cases may need to be treated at a hospital. This may include:  Not eating or drinking.  Taking prescription pain medicine.  Getting antibiotic medicines through an IV tube.  Getting fluid and food through an IV tube.  Having surgery. When you are feeling better, your doctor may tell you to have a test to check your colon (colonoscopy). Follow these instructions at home: Medicines  Take over-the-counter and prescription medicines only as told by your doctor. These include: ? Antibiotics. ? Pain medicines. ? Fiber  pills. ? Probiotics. ? Stool softeners.  If you were prescribed an antibiotic medicine, take it as told by your doctor. Do not stop taking the antibiotic even if you start to feel better.  Ask your doctor if the medicine prescribed to you requires you to avoid driving or using machinery. Eating and drinking  Follow a diet as told by your doctor.  When you feel better, your doctor may tell you to change your diet. You may need to eat a lot of fiber. Fiber makes it easier to poop (have a bowel movement). Foods with fiber include: ? Berries. ? Beans. ? Lentils. ? Green vegetables.  Avoid eating red meat.   General instructions  Do not use any products that contain nicotine or tobacco, such as cigarettes, e-cigarettes, and chewing tobacco. If you need help quitting, ask your doctor.  Exercise 3 or more times a week. Try to get 30 minutes each time. Exercise enough to sweat and make your heart beat faster.  Keep all follow-up visits as told by your doctor. This is important. Contact a doctor if:  Your pain does not get better.  You are not pooping like normal. Get help right away if:  Your pain gets worse.  Your symptoms do not get better.  Your symptoms get worse very fast.  You have a fever.  You vomit more than one time.  You have poop that is: ? Bloody. ? Black. ? Tarry. Summary  This condition happens when   small pouches in your colon get infected or swollen.  Take medicines only as told by your doctor.  Follow a diet as told by your doctor.  Keep all follow-up visits as told by your doctor. This is important. This information is not intended to replace advice given to you by your health care provider. Make sure you discuss any questions you have with your health care provider. Document Revised: 10/03/2018 Document Reviewed: 10/03/2018 Elsevier Patient Education  2021 [employer] Inc. Vomiting, Adult Vomiting occurs when stomach contents are thrown up and out  of the mouth. Many people notice nausea before vomiting. Vomiting can make you feel weak and cause you to become dehydrated. Dehydration can make you feel tired and thirsty, cause you to have a dry mouth, and decrease how often you urinate. Older adults and people who have other diseases or a weak body defense system (immune system) are at higher risk for dehydration. It is important to treat vomiting as told by your health care provider. Follow these instructions at home: Eating and drinking Follow these recommendations as told by your health care provider:  Take an oral rehydration solution (ORS). This is a drink that is sold at pharmacies and retail stores.  Eat bland, easy-to-digest foods in small amounts as you are able. These foods include bananas, applesauce, rice, lean meats, toast, and crackers.  Drink clear fluids slowly and in small amounts as you are able. Clear fluids include water, ice chips, low-calorie sports drinks, and fruit juice that has water added (diluted fruit juice).  Avoid drinking fluids that contain a lot of sugar or caffeine, such as energy drinks, sports drinks, and soda.  Avoid alcohol.  Avoid spicy or fatty foods.      General instructions  Wash your hands often using soap and water. If soap and water are not available, use hand sanitizer. Make sure that everyone in your household washes their hands frequently.  Take over-the-counter and prescription medicines only as told by your health care provider.  Rest at home while you recover.  Watch your condition for any changes.  Keep all follow-up visits as told by your health care provider. This is important.   Contact a health care provider if:  Your vomiting gets worse.  You have new symptoms.  You have a fever.  You cannot drink fluids without vomiting.  You feel light-headed or dizzy.  You have a headache.  You have muscle cramps.  You have a rash.  You have pain while urinating. Get  help right away if:  You have pain in your chest, neck, arm, or jaw.  You feel extremely weak or you faint.  You have persistent vomiting.  You have vomit that is bright red or looks like black coffee grounds.  You have stools that are bloody or black, or stools that look like tar.  You have a severe headache, a stiff neck, or both.  You have severe pain, cramping, or bloating in your abdomen.  You have trouble breathing or you are breathing very quickly.  Your heart is beating very quickly.  Your skin feels cold and clammy.  You feel confused.  You have signs of dehydration, such as: ? Dark urine, very little urine, or no urine. ? Cracked lips. ? Dry mouth. ? Sunken eyes. ? Sleepiness. ? Weakness. These symptoms may represent a serious problem that is an emergency. Do not wait to see if the symptoms will go away. Get medical help right away. Call your local  emergency services (911 in the [place] Do not drive yourself to the hospital. Summary  Vomiting occurs when stomach contents are thrown up and out of the mouth. Vomiting can cause you to become dehydrated. Older adults and people who have other diseases or a weak immune system are at higher risk for dehydration.  It is important to treat vomiting as told by your health care provider. Follow your health care provider's instructions about eating and drinking.  Wash your hands often using soap and water. If soap and water are not available, use hand sanitizer. Make sure that everyone in your household washes their hands frequently.  Watch your condition for any changes and for signs of dehydration.  Keep all follow-up visits as told by your health care provider. This is important. This information is not intended to replace advice given to you by your health care provider. Make sure you discuss any questions you have with your health care provider. Document Revised: 06/10/2018 Document Reviewed: 06/01/2017 Elsevier  Patient Education  2021 Elsevier Inc. Nausea, Adult Nausea is feeling sick to your stomach or feeling that you are about to throw up (vomit). Feeling sick to your stomach is usually not serious, but it may be an early sign of a more serious medical problem. As you feel sicker to your stomach, you may throw up. If you throw up, or if you are not able to drink enough fluids, there is a risk that you may lose too much water in your body (get dehydrated). If you lose too much water in your body, you may:  Feel tired.  Feel thirsty.  Have a dry mouth.  Have cracked lips.  Go pee (urinate) less often. Older adults and people who have other diseases or a weak body defense system (immune system) have a higher risk of losing too much water in the body. The main goals of treating this condition are:  To relieve your nausea.  To ensure your nausea occurs less often.  To prevent throwing up and losing too much fluid. Follow these instructions at home: Watch your symptoms for any changes. Tell your doctor about them. Follow these instructions as told by your doctor. Eating and drinking  Take an ORS (oral rehydration solution). This is a drink that is sold at pharmacies and stores.  Drink clear fluids in small amounts as you are able. These include: ? Water. ? Ice chips. ? Fruit juice that has water added (diluted fruit juice). ? Low-calorie sports drinks.  Eat bland, easy-to-digest foods in small amounts as you are able, such as: ? Bananas. ? Applesauce. ? Rice. ? Low-fat (lean) meats. ? Toast. ? Crackers.  Avoid drinking fluids that have a lot of sugar or caffeine in them. This includes energy drinks, sports drinks, and soda.  Avoid alcohol.  Avoid spicy or fatty foods.      General instructions  Take over-the-counter and prescription medicines only as told by your doctor.  Rest at home while you get better.  Drink enough fluid to keep your pee (urine) pale yellow.  Take  slow and deep breaths when you feel sick to your stomach.  Avoid food or things that have strong smells.  Wash your hands often with soap and water. If you cannot use soap and water, use hand sanitizer.  Make sure that all people in your home wash their hands well and often.  Keep all follow-up visits as told by your doctor. This is important. Contact a doctor if:  You feel sicker to your stomach.  You feel sick to your stomach for more than 2 days.  You throw up.  You are not able to drink fluids without throwing up.  You have new symptoms.  You have a fever.  You have a headache.  You have muscle cramps.  You have a rash.  You have pain while peeing.  You feel light-headed or dizzy. Get help right away if:  You have pain in your chest, neck, arm, or jaw.  You feel very weak or you pass out (faint).  You have throw up that is bright red or looks like coffee grounds.  You have bloody or black poop (stools) or poop that looks like tar.  You have a very bad headache, a stiff neck, or both.  You have very bad pain, cramping, or bloating in your belly (abdomen).  You have trouble breathing or you are breathing very quickly.  Your heart is beating very quickly.  Your skin feels cold and clammy.  You feel confused.  You have signs of losing too much water in your body, such as: ? Dark pee, very little pee, or no pee. ? Cracked lips. ? Dry mouth. ? Sunken eyes. ? Sleepiness. ? Weakness. These symptoms may be an emergency. Do not wait to see if the symptoms will go away. Get medical help right away. Call your local emergency services (911 in the [place] Do not drive yourself to the hospital. Summary  Nausea is feeling sick to your stomach or feeling that you are about to throw up (vomit).  If you throw up, or if you are not able to drink enough fluids, there is a risk that you may lose too much water in your body (get dehydrated).  Eat and drink what your  doctor tells you. Take over-the-counter and prescription medicines only as told by your doctor.  Contact a doctor right away if your symptoms get worse or you have new symptoms.  Keep all follow-up visits as told by your doctor. This is important. This information is not intended to replace advice given to you by your health care provider. Make sure you discuss any questions you have with your health care provider. Document Revised: 11/22/2018 Document Reviewed: 06/01/2017 Elsevier Patient Education  2021 [employer].

## 2020-03-28 NOTE — Progress Notes (Signed)
MyChart Video Visit    Virtual Visit via Video Note   This visit type was conducted due to national recommendations for restrictions regarding the COVID-19 Pandemic (e.g. social distancing) in an effort to limit this patient's exposure and mitigate transmission in our community. This patient is at least at moderate risk for complications without adequate follow up. This format is felt to be most appropriate for this patient at this time. Physical exam was limited by quality of the video and audio technology used for the visit.  Parties involved in visit as below:   Patient location: at home  Provider location: Provider: Provider's office at  [facility], [place].    I discussed the limitations of evaluation and management by telemedicine and the availability of in person appointments. The patient expressed understanding and agreed to proceed.  Patient: Bonnie Shepard   DOB: 1985-03-03   35 y.o. Female  MRN: [id number] Visit Date: 03/28/2020  Today's healthcare provider: Jairo Ben, FNP   Chief Complaint  Patient presents with  . Nausea   Subjective    Emesis  This is a new problem. The current episode started in the past 7 days. The problem has been waxing and waning. The emesis has an appearance of bile. There has been no fever. Associated symptoms include abdominal pain, arthralgias, diarrhea, headaches and sweats. Pertinent negatives include no chest pain, chills, coughing, dizziness, fever, myalgias, URI or weight loss. Associated symptoms comments: Rectal bleeding . Risk factors include ill contacts. Treatments tried: zofran. The treatment provided mild relief.    Onset was this Monday.  Cramping right lower side.  She has some mild bleeding in stool, smears did have hemmorohid banded. She sees Dr. Servando Snare.  She has endoscopy on Thursday.  Doing well with lexapro 15mg  daily not suicidal or homicidal.  Mild tenderness lower abdomen reported.   Patient  denies any fever, body aches,chills, rash, chest pain, shortness of breath, no vomiting today.  Denies dizziness, lightheadedness, pre syncopal or syncopal episodes.     Medications: Outpatient Medications Prior to Visit  Medication Sig  . Azelastine HCl 0.15 % SOLN Place 2 sprays into the nose daily.  . Ciprofloxacin HCl 0.2 % otic solution Place 0.2 mLs into the right ear 2 (two) times daily.  . predniSONE (STERAPRED UNI-PAK 21 TAB) 10 MG (21) TBPK tablet PO: Take 6 tablets on day 1:Take 5 tablets day 2:Take 4 tablets day 3: Take 3 tablets day 4:Take 2 tablets day five: 5 Take 1 tablet day 6 (Patient not taking: Reported on 03/19/2020)  . Vitamin D, Ergocalciferol, (DRISDOL) 1.25 MG (50000 UNIT) CAPS capsule Take 1 capsule (50,000 Units total) by mouth every 7 (seven) days. (taking one tablet per week) walk in lab in office 1-2 weeks after completing prescription.  . [DISCONTINUED] albuterol (VENTOLIN HFA) 108 (90 Base) MCG/ACT inhaler Inhale 2 puffs into the lungs every 6 (six) hours as needed for wheezing or shortness of breath.  . [DISCONTINUED] amoxicillin-clavulanate (AUGMENTIN) 875-125 MG tablet Take 1 tablet by mouth 2 (two) times daily.  . [DISCONTINUED] escitalopram (LEXAPRO) 10 MG tablet Take 1 tablet (10 mg total) by mouth daily.  . [DISCONTINUED] escitalopram (LEXAPRO) 5 MG tablet Take 1 tablet (5 mg total) by mouth daily.  . [DISCONTINUED] fluticasone (FLONASE) 50 MCG/ACT nasal spray Place 2 sprays into both nostrils daily.  . [DISCONTINUED] hydrochlorothiazide (HYDRODIURIL) 25 MG tablet Take 0.5 tablets (12.5 mg total) by mouth daily.  . [DISCONTINUED] levocetirizine (XYZAL) 5 MG  tablet Take 1 tablet (5 mg total) by mouth every evening.  . [DISCONTINUED] pantoprazole (PROTONIX) 40 MG tablet Take 1 tablet (40 mg total) by mouth daily.   No facility-administered medications prior to visit.    Review of Systems  Constitutional: Negative for chills, fever and weight  loss.  Respiratory: Negative for cough.   Cardiovascular: Negative for chest pain.  Gastrointestinal: Positive for abdominal pain, blood in stool, diarrhea and vomiting.  Musculoskeletal: Positive for arthralgias. Negative for myalgias.  Neurological: Positive for headaches. Negative for dizziness.    Last CBC Lab Results  Component Value Date   WBC 7.2 02/07/2020   HGB 13.7 02/07/2020   HCT 41.1 02/07/2020   MCV 93 02/07/2020   MCH 31.0 02/07/2020   RDW 11.8 02/07/2020   PLT 294 01/18/2020   Last metabolic panel Lab Results  Component Value Date   GLUCOSE 92 02/07/2020   NA 145 (H) 02/07/2020   K 4.6 02/07/2020   CL 105 02/07/2020   CO2 27 02/07/2020   BUN 12 02/07/2020   CREATININE 0.89 02/07/2020   GFRNONAA 85 02/07/2020   GFRAA 98 02/07/2020   CALCIUM 9.9 02/07/2020   PROT 7.4 02/07/2020   ALBUMIN 4.5 02/07/2020   LABGLOB 2.9 02/07/2020   AGRATIO 1.6 02/07/2020   BILITOT <0.2 02/07/2020   ALKPHOS 96 02/07/2020   AST 40 02/07/2020   ALT 64 (H) 02/07/2020   ANIONGAP 9 01/18/2020   Last lipids Lab Results  Component Value Date   CHOL 181 01/20/2019   HDL 38 (L) 01/20/2019   LDLCALC 126 (H) 01/20/2019   TRIG 92 01/20/2019   Last hemoglobin A1c Lab Results  Component Value Date   HGBA1C 5.4 01/17/2020   Last thyroid functions Lab Results  Component Value Date   TSH 0.752 01/17/2020   Last vitamin D Lab Results  Component Value Date   VD25OH 35.80 03/06/2020   Last vitamin B12 and Folate No results found for: VITAMINB12, FOLATE    Objective    There were no vitals taken for this visit. BP Readings from Last 3 Encounters:  03/19/20 (!) 142/87  03/06/20 (!) 147/88  02/13/20 (!) 145/89   Wt Readings from Last 3 Encounters:  03/19/20 237 lb (107.5 kg)  03/06/20 237 lb 6 oz (107.7 kg)  02/13/20 237 lb (107.5 kg)      Physical Exam    Patient is alert and oriented and responsive to questions Engages in conversation with provider. Speaks in  full sentences without any pauses without any shortness of breath or distress.   Assessment & Plan     The primary encounter diagnosis was Diverticulitis. Diagnoses of Gastroesophageal reflux disease, unspecified whether esophagitis present, Suspected COVID-19 virus infection, Non-recurrent acute serous otitis media of right ear, Hypertension, unspecified type, Allergic rhinitis, unspecified seasonality, unspecified trigger, Nausea, and Diarrhea of presumed infectious origin were also pertinent to this visit.   Meds ordered this encounter  Medications  . ondansetron (ZOFRAN) 8 MG tablet    Sig: Take 1 tablet (8 mg total) by mouth every 8 (eight) hours as needed for nausea or vomiting.    Dispense:  30 tablet    Refill:  0  . amoxicillin-clavulanate (AUGMENTIN) 875-125 MG tablet    Sig: Take 1 tablet by mouth 2 (two) times daily.    Dispense:  20 tablet    Refill:  0  . escitalopram (LEXAPRO) 5 MG tablet    Sig: Take 1 tablet (5 mg total) by mouth daily.  Dispense:  270 tablet    Refill:  1  . pantoprazole (PROTONIX) 40 MG tablet    Sig: Take 1 tablet (40 mg total) by mouth daily.    Dispense:  90 tablet    Refill:  3  . fluticasone (FLONASE) 50 MCG/ACT nasal spray    Sig: Place 2 sprays into both nostrils daily.    Dispense:  16 g    Refill:  6  . levocetirizine (XYZAL) 5 MG tablet    Sig: Take 1 tablet (5 mg total) by mouth every evening.    Dispense:  30 tablet    Refill:  3  . hydrochlorothiazide (HYDRODIURIL) 25 MG tablet    Sig: Take 0.5 tablets (12.5 mg total) by mouth daily.    Dispense:  90 tablet    Refill:  3  . DISCONTD: albuterol (VENTOLIN HFA) 108 (90 Base) MCG/ACT inhaler    Sig: Inhale 2 puffs into the lungs every 6 (six) hours as needed for wheezing or shortness of breath.    Dispense:  8 g    Refill:  0  chronic medications refilled as well.  Work note given through my chart for 03/28/20 and 03/26/20 and to follow up with Dr. Servando Snare [facility].  Return  in about 1 week (around 04/04/2020), or if symptoms worsen or fail to improve, for Go to Emergency room/ urgent care if worse, at any time for any worsening symptoms.    Red Flags discussed. The patient was given clear instructions to go to ER or return to medical center if any red flags develop, symptoms do not improve, worsen or new problems develop. They verbalized understanding.  I discussed the assessment and treatment plan with the patient. The patient was provided an opportunity to ask questions and all were answered. The patient agreed with the plan and demonstrated an understanding of the instructions.    The patient was advised to call back or seek an in-person evaluation if the symptoms worsen or if the condition fails to improve as anticipated.  I provided 30 minutes of non-face-to-face time during this encounter.  The entirety of the information documented in the History of Present Illness, Review of Systems and Physical Exam were personally obtained by me. Portions of this information were initially documented by the CMA and reviewed by me for thoroughness and accuracy.     Jairo Ben, FNP [facility] [phone] (phone) [phone] (fax)  [facility]

## 2020-03-29 ENCOUNTER — Encounter: Payer: Self-pay | Admitting: Gastroenterology

## 2020-03-29 ENCOUNTER — Other Ambulatory Visit: Payer: Self-pay

## 2020-04-02 ENCOUNTER — Telehealth: Payer: No Typology Code available for payment source | Admitting: Adult Health

## 2020-04-03 ENCOUNTER — Other Ambulatory Visit: Payer: Self-pay

## 2020-04-03 ENCOUNTER — Other Ambulatory Visit
Admission: RE | Admit: 2020-04-03 | Discharge: 2020-04-03 | Disposition: A | Discharge status: Home / Self Care | Payer: No Typology Code available for payment source | Source: Ambulatory Visit | Attending: Gastroenterology | Admitting: Gastroenterology

## 2020-04-03 ENCOUNTER — Other Ambulatory Visit: Payer: No Typology Code available for payment source | Admitting: Orthotics

## 2020-04-03 DIAGNOSIS — Z01812 Encounter for preprocedural laboratory examination: Secondary | ICD-10-CM | POA: Diagnosis not present

## 2020-04-03 DIAGNOSIS — Z20822 Contact with and (suspected) exposure to covid-19: Secondary | ICD-10-CM | POA: Insufficient documentation

## 2020-04-03 LAB — SARS CORONAVIRUS 2 (TAT 6-24 HRS): SARS Coronavirus 2: NEGATIVE

## 2020-04-03 NOTE — Discharge Instructions (Signed)

## 2020-04-04 ENCOUNTER — Ambulatory Visit
Admission: RE | Admit: 2020-04-04 | Discharge: 2020-04-04 | Disposition: A | Discharge status: Home / Self Care | Payer: No Typology Code available for payment source | Attending: Gastroenterology | Admitting: Gastroenterology

## 2020-04-04 ENCOUNTER — Encounter
Admission: RE | Disposition: A | Discharge status: Home / Self Care | Payer: Self-pay | Source: Home / Self Care | Attending: Gastroenterology

## 2020-04-04 ENCOUNTER — Ambulatory Visit: Payer: No Typology Code available for payment source | Admitting: Anesthesiology

## 2020-04-04 ENCOUNTER — Encounter: Payer: Self-pay | Admitting: Gastroenterology

## 2020-04-04 DIAGNOSIS — K219 Gastro-esophageal reflux disease without esophagitis: Secondary | ICD-10-CM | POA: Insufficient documentation

## 2020-04-04 DIAGNOSIS — R110 Nausea: Secondary | ICD-10-CM | POA: Diagnosis not present

## 2020-04-04 DIAGNOSIS — Z79899 Other long term (current) drug therapy: Secondary | ICD-10-CM | POA: Diagnosis not present

## 2020-04-04 DIAGNOSIS — F17210 Nicotine dependence, cigarettes, uncomplicated: Secondary | ICD-10-CM | POA: Diagnosis not present

## 2020-04-04 DIAGNOSIS — R112 Nausea with vomiting, unspecified: Secondary | ICD-10-CM | POA: Diagnosis not present

## 2020-04-04 HISTORY — PX: ESOPHAGOGASTRODUODENOSCOPY (EGD) WITH PROPOFOL: SHX5813

## 2020-04-04 LAB — POCT PREGNANCY, URINE: Preg Test, Ur: NEGATIVE

## 2020-04-04 SURGERY — ESOPHAGOGASTRODUODENOSCOPY (EGD) WITH PROPOFOL
Anesthesia: General | Site: Rectum

## 2020-04-04 MED ORDER — LIDOCAINE HCL (CARDIAC) PF 100 MG/5ML IV SOSY
PREFILLED_SYRINGE | INTRAVENOUS | Status: DC | PRN
  Administered 2020-04-04: 50 mg via INTRAVENOUS

## 2020-04-04 MED ORDER — GLYCOPYRROLATE 0.2 MG/ML IJ SOLN
INTRAMUSCULAR | Status: DC | PRN
  Administered 2020-04-04: .2 mg via INTRAVENOUS

## 2020-04-04 MED ORDER — LACTATED RINGERS IV SOLN: INTRAVENOUS | Status: DC

## 2020-04-04 MED ORDER — STERILE WATER FOR IRRIGATION IR SOLN: Status: DC | PRN

## 2020-04-04 MED ORDER — SODIUM CHLORIDE 0.9 % IV SOLN: INTRAVENOUS | Status: DC

## 2020-04-04 MED ORDER — PROPOFOL 10 MG/ML IV BOLUS
INTRAVENOUS | Status: DC | PRN
  Administered 2020-04-04: 50 mg via INTRAVENOUS
  Administered 2020-04-04: 150 mg via INTRAVENOUS

## 2020-04-04 SURGICAL SUPPLY — 8 items
BLOCK BITE 60FR ADLT L/F GRN (MISCELLANEOUS) ×2 IMPLANT
FORCEPS BIOP RAD 4 LRG CAP 4 (CUTTING FORCEPS) ×1 IMPLANT
GOWN CVR UNV OPN BCK APRN NK (MISCELLANEOUS) ×2 IMPLANT
GOWN ISOL THUMB LOOP REG UNIV (MISCELLANEOUS) ×4
KIT PRC NS LF DISP ENDO (KITS) ×1 IMPLANT
KIT PROCEDURE OLYMPUS (KITS) ×2
MANIFOLD NEPTUNE II (INSTRUMENTS) ×2 IMPLANT
WATER STERILE IRR 250ML POUR (IV SOLUTION) ×2 IMPLANT

## 2020-04-04 NOTE — Anesthesia Postprocedure Evaluation (Signed)
Anesthesia Post Note  Patient: Bonnie Shepard  Procedure(s) Performed: ESOPHAGOGASTRODUODENOSCOPY (EGD) WITH BIOPSY (N/A Rectum)     Patient location during evaluation: PACU Anesthesia Type: General Level of consciousness: awake and alert Pain management: pain level controlled Vital Signs Assessment: post-procedure vital signs reviewed and stable Respiratory status: spontaneous breathing, nonlabored ventilation, respiratory function stable and patient connected to nasal cannula oxygen Cardiovascular status: blood pressure returned to baseline and stable Postop Assessment: no apparent nausea or vomiting Anesthetic complications: no   No complications documented.  Scarlette Slice

## 2020-04-04 NOTE — H&P (Signed)
Midge Minium, MD [facility] [address], [address] [place], [place] [address] Phone:[phone] Fax : [phone]  Primary Care Physician:  Berniece Pap, FNP Primary Gastroenterologist:  Dr. Servando Snare  Pre-Procedure History & Physical: HPI:  Bonnie Shepard is a 35 y.o. female is here for an endoscopy.   Past Medical History:  Diagnosis Date  . Acid reflux   . Asthma   . Family history of adverse reaction to anesthesia    Mother - PONV  . Infection    freq UTI  . Vaginal Pap smear, abnormal    f/u wnl  . Wears contact lenses     Past Surgical History:  Procedure Laterality Date  . COLONOSCOPY WITH PROPOFOL N/A 04/03/2019   Procedure: COLONOSCOPY WITH PROPOFOL;  Surgeon: Midge Minium, MD;  Location: [facility];  Service: Endoscopy;  Laterality: N/A;  Priority 3  . DILATION AND EVACUATION N/A 09/07/2013   Procedure: DILATATION AND EVACUATION;  Surgeon: Freddrick March. Tenny Craw, MD;  Location: [facility];  Service: Gynecology;  Laterality: N/A;  . TONSILLECTOMY      Prior to Admission medications   Medication Sig Start Date End Date Taking? Authorizing Provider  amoxicillin-clavulanate (AUGMENTIN) 875-125 MG tablet Take 1 tablet by mouth 2 (two) times daily. 03/28/20  Yes Flinchum, Eula Fried, FNP  Ciprofloxacin HCl 0.2 % otic solution Place 0.2 mLs into the right ear 2 (two) times daily. 02/07/20  Yes Flinchum, Eula Fried, FNP  escitalopram (LEXAPRO) 5 MG tablet Take 1 tablet (5 mg total) by mouth daily. 03/28/20  Yes Flinchum, Eula Fried, FNP  fluticasone (FLONASE) 50 MCG/ACT nasal spray Place 2 sprays into both nostrils daily. 03/28/20  Yes Flinchum, Eula Fried, FNP  levocetirizine (XYZAL) 5 MG tablet Take 1 tablet (5 mg total) by mouth every evening. 03/28/20  Yes Flinchum, Eula Fried, FNP  ondansetron (ZOFRAN) 8 MG tablet Take 1 tablet (8 mg total) by mouth every 8 (eight) hours as needed for nausea or vomiting. 03/28/20  Yes Flinchum, Eula Fried, FNP  pantoprazole (PROTONIX) 40 MG tablet  Take 1 tablet (40 mg total) by mouth daily. 03/28/20  Yes Flinchum, Eula Fried, FNP  predniSONE (STERAPRED UNI-PAK 21 TAB) 10 MG (21) TBPK tablet PO: Take 6 tablets on day 1:Take 5 tablets day 2:Take 4 tablets day 3: Take 3 tablets day 4:Take 2 tablets day five: 5 Take 1 tablet day 6 02/07/20  Yes Flinchum, Eula Fried, FNP  Vitamin D, Ergocalciferol, (DRISDOL) 1.25 MG (50000 UNIT) CAPS capsule Take 1 capsule (50,000 Units total) by mouth every 7 (seven) days. (taking one tablet per week) walk in lab in office 1-2 weeks after completing prescription. 03/06/20  Yes Flinchum, Eula Fried, FNP  Azelastine HCl 0.15 % SOLN Place 2 sprays into the nose daily. Patient not taking: Reported on 03/29/2020 02/07/20   Flinchum, Eula Fried, FNP  hydrochlorothiazide (HYDRODIURIL) 25 MG tablet Take 0.5 tablets (12.5 mg total) by mouth daily. Patient not taking: Reported on 03/29/2020 03/28/20   Berniece Pap, FNP    Allergies as of 03/27/2020  . (No Known Allergies)    Family History  Problem Relation Age of Onset  . Hypertension Other   . Cancer Other   . Hypertension Mother   . Hypertension Father   . Hypertension Maternal Grandmother   . Heart disease Maternal Grandmother   . Hearing loss Neg Hx     Social History   Socioeconomic History  . Marital status: Single    Spouse name: Not on file  .  Number of children: Not on file  . Years of education: Not on file  . Highest education level: Not on file  Occupational History  . Not on file  Tobacco Use  . Smoking status: Current Every Day Smoker    Packs/day: 0.25    Years: 16.00    Pack years: 4.00    Types: Cigarettes  . Smokeless tobacco: Never Used  . Tobacco comment: since age 89  Vaping Use  . Vaping Use: Never used  Substance and Sexual Activity  . Alcohol use: No    Comment: occasionally  . Drug use: No  . Sexual activity: Yes    Birth control/protection: None  Other Topics Concern  . Not on file  Social History Narrative  .  Not on file   Social Determinants of Health   Financial Resource Strain: Not on file  Food Insecurity: Not on file  Transportation Needs: Not on file  Physical Activity: Not on file  Stress: Not on file  Social Connections: Not on file  Intimate Partner Violence: Not on file    Review of Systems: See HPI, otherwise negative ROS  Physical Exam: BP 132/76   Pulse 62   Temp 98.2 F (36.8 C) (Temporal)   Resp 20   Ht 5\' 3"  (1.6 m)   Wt 106.6 kg   SpO2 96%   BMI 41.63 kg/m  General:   Alert,  pleasant and cooperative in NAD Head:  Normocephalic and atraumatic. Neck:  Supple; no masses or thyromegaly. Lungs:  Clear throughout to auscultation.    Heart:  Regular rate and rhythm. Abdomen:  Soft, nontender and nondistended. Normal bowel sounds, without guarding, and without rebound.   Neurologic:  Alert and  oriented x4;  grossly normal neurologically.  Impression/Plan: Bonnie Shepard is here for an endoscopy to be performed for nausea  Risks, benefits, limitations, and alternatives regarding  endoscopy have been reviewed with the patient.  Questions have been answered.  All parties agreeable.   Nicholaus Bloom, MD  04/04/2020, 9:11 AM

## 2020-04-04 NOTE — Transfer of Care (Signed)
Immediate Anesthesia Transfer of Care Note  Patient: Bonnie Shepard  Procedure(s) Performed: ESOPHAGOGASTRODUODENOSCOPY (EGD) WITH BIOPSY (N/A Rectum)  Patient Location: PACU  Anesthesia Type: General  Level of Consciousness: awake, alert  and patient cooperative  Airway and Oxygen Therapy: Patient Spontanous Breathing and Patient connected to supplemental oxygen  Post-op Assessment: Post-op Vital signs reviewed, Patient's Cardiovascular Status Stable, Respiratory Function Stable, Patent Airway and No signs of Nausea or vomiting  Post-op Vital Signs: Reviewed and stable  Complications: No complications documented.

## 2020-04-04 NOTE — Anesthesia Preprocedure Evaluation (Signed)
Anesthesia Evaluation  Patient identified by MRN, date of birth, ID band Patient awake    Reviewed: Allergy & Precautions, H&P , NPO status , Patient's Chart, lab work & pertinent test results, reviewed documented beta blocker date and time   Airway Mallampati: II  TM Distance: >3 FB Neck ROM: full    Dental no notable dental hx.    Pulmonary asthma , Current Smoker and Patient abstained from smoking.,    Pulmonary exam normal breath sounds clear to auscultation       Cardiovascular Exercise Tolerance: Good hypertension,  Rhythm:regular Rate:Normal     Neuro/Psych Anxiety Depression negative neurological ROS     GI/Hepatic Neg liver ROS, GERD  ,  Endo/Other  negative endocrine ROS  Renal/GU negative Renal ROS  negative genitourinary   Musculoskeletal   Abdominal   Peds  Hematology negative hematology ROS (+)   Anesthesia Other Findings   Reproductive/Obstetrics negative OB ROS                             Anesthesia Physical Anesthesia Plan  ASA: II  Anesthesia Plan: General   Post-op Pain Management:    Induction:   PONV Risk Score and Plan: 2 and Propofol infusion  Airway Management Planned:   Additional Equipment:   Intra-op Plan:   Post-operative Plan:   Informed Consent: I have reviewed the patients History and Physical, chart, labs and discussed the procedure including the risks, benefits and alternatives for the proposed anesthesia with the patient or authorized representative who has indicated his/her understanding and acceptance.     Dental Advisory Given  Plan Discussed with: CRNA  Anesthesia Plan Comments:         Anesthesia Quick Evaluation

## 2020-04-04 NOTE — Anesthesia Procedure Notes (Signed)
Date/Time: 04/04/2020 9:44 AM Performed by: Maree Krabbe, CRNA Pre-anesthesia Checklist: Patient identified, Emergency Drugs available, Suction available, Timeout performed and Patient being monitored Patient Re-evaluated:Patient Re-evaluated prior to induction Oxygen Delivery Method: Nasal cannula Placement Confirmation: positive ETCO2

## 2020-04-04 NOTE — Op Note (Signed)
[facility] Patient Name: Bonnie Shepard Procedure Date: 04/04/2020 9:39 AM MRN: [id number] Account #: [id number] Date of Birth: Dec 31, 1985 Admit Type: Outpatient Age: 35 Room: [facility] OR ROOM 01 Gender: Female Note Status: Finalized Procedure:             Upper GI endoscopy Indications:           Nausea Providers:             Midge Minium MD, MD Referring MD:          Eula Fried. Flinchum (Referring MD) Medicines:             Propofol per Anesthesia Complications:         No immediate complications. Procedure:             Pre-Anesthesia Assessment:                        - Prior to the procedure, a History and Physical was                         performed, and patient medications and allergies were                         reviewed. The patient's tolerance of previous                         anesthesia was also reviewed. The risks and benefits                         of the procedure and the sedation options and risks                         were discussed with the patient. All questions were                         answered, and informed consent was obtained. Prior                         Anticoagulants: The patient has taken no previous                         anticoagulant or antiplatelet agents. ASA Grade                         Assessment: II - A patient with mild systemic disease.                         After reviewing the risks and benefits, the patient                         was deemed in satisfactory condition to undergo the                         procedure.                        After obtaining informed consent, the endoscope was  passed under direct vision. Throughout the procedure,                         the patient's blood pressure, pulse, and oxygen                         saturations were monitored continuously. The was                         introduced through the mouth, and advanced to the                          second part of duodenum. The upper GI endoscopy was                         accomplished without difficulty. The patient tolerated                         the procedure well. Findings:      The examined esophagus was normal.      The entire examined stomach was normal. Biopsies were taken with a cold       forceps for histology.      The examined duodenum was normal. Impression:            - Normal esophagus.                        - Normal stomach. Biopsied.                        - Normal examined duodenum. Recommendation:        - Discharge patient to home.                        - Resume previous diet.                        - Continue present medications.                        - Await pathology results. Procedure Code(s):     --- Professional ---                        [phone], Esophagogastroduodenoscopy, flexible,                         transoral; with biopsy, single or multiple Diagnosis Code(s):     --- Professional ---                        R11.0, Nausea CPT copyright 2019 American Medical Association. All rights reserved. The codes documented in this report are preliminary and upon coder review may  be revised to meet current compliance requirements. Midge Minium MD, MD 04/04/2020 9:51:22 AM This report has been signed electronically. Number of Addenda: 0 Note Initiated On: 04/04/2020 9:39 AM Total Procedure Duration: 0 hours 2 minutes 0 seconds  Estimated Blood Loss:  Estimated blood loss: none.      [facility]

## 2020-04-05 LAB — SURGICAL PATHOLOGY

## 2020-04-08 ENCOUNTER — Encounter: Payer: Self-pay | Admitting: Gastroenterology

## 2020-04-09 ENCOUNTER — Ambulatory Visit: Payer: No Typology Code available for payment source | Admitting: Adult Health

## 2020-04-18 ENCOUNTER — Ambulatory Visit (INDEPENDENT_AMBULATORY_CARE_PROVIDER_SITE_OTHER): Payer: No Typology Code available for payment source | Admitting: Adult Health

## 2020-04-18 DIAGNOSIS — Z91199 Patient's noncompliance with other medical treatment and regimen due to unspecified reason: Secondary | ICD-10-CM | POA: Insufficient documentation

## 2020-04-18 DIAGNOSIS — Z5329 Procedure and treatment not carried out because of patient's decision for other reasons: Secondary | ICD-10-CM

## 2020-04-18 NOTE — Progress Notes (Signed)
      Established patient visit   Patient: Bonnie Shepard   DOB: 22-Mar-1985   35 y.o. Female  MRN: [id number] Visit Date: 04/18/2020  No show for office visit scheduled.

## 2020-04-20 ENCOUNTER — Observation Stay
Admission: EM | Admit: 2020-04-20 | Discharge: 2020-04-21 | Disposition: A | Discharge status: Home / Self Care | Payer: No Typology Code available for payment source | Attending: Internal Medicine | Admitting: Internal Medicine

## 2020-04-20 ENCOUNTER — Other Ambulatory Visit: Payer: Self-pay

## 2020-04-20 DIAGNOSIS — J45909 Unspecified asthma, uncomplicated: Secondary | ICD-10-CM | POA: Insufficient documentation

## 2020-04-20 DIAGNOSIS — Z79899 Other long term (current) drug therapy: Secondary | ICD-10-CM | POA: Insufficient documentation

## 2020-04-20 DIAGNOSIS — F17210 Nicotine dependence, cigarettes, uncomplicated: Secondary | ICD-10-CM | POA: Diagnosis not present

## 2020-04-20 DIAGNOSIS — Z20822 Contact with and (suspected) exposure to covid-19: Secondary | ICD-10-CM | POA: Insufficient documentation

## 2020-04-20 DIAGNOSIS — K219 Gastro-esophageal reflux disease without esophagitis: Secondary | ICD-10-CM | POA: Diagnosis present

## 2020-04-20 DIAGNOSIS — R001 Bradycardia, unspecified: Principal | ICD-10-CM | POA: Diagnosis present

## 2020-04-20 DIAGNOSIS — R112 Nausea with vomiting, unspecified: Secondary | ICD-10-CM | POA: Diagnosis present

## 2020-04-20 DIAGNOSIS — I10 Essential (primary) hypertension: Secondary | ICD-10-CM | POA: Diagnosis not present

## 2020-04-20 DIAGNOSIS — F418 Other specified anxiety disorders: Secondary | ICD-10-CM | POA: Diagnosis not present

## 2020-04-20 DIAGNOSIS — Z6841 Body Mass Index (BMI) 40.0 and over, adult: Secondary | ICD-10-CM

## 2020-04-20 LAB — URINALYSIS, COMPLETE (UACMP) WITH MICROSCOPIC
Bacteria, UA: NONE SEEN
Bilirubin Urine: NEGATIVE
Glucose, UA: 50 mg/dL — AB
Ketones, ur: 20 mg/dL — AB
Leukocytes,Ua: NEGATIVE
Nitrite: NEGATIVE
Protein, ur: NEGATIVE mg/dL
Specific Gravity, Urine: 1.013 (ref 1.005–1.030)
pH: 6 (ref 5.0–8.0)

## 2020-04-20 LAB — CBC
HCT: 42.5 % (ref 36.0–46.0)
Hemoglobin: 14.6 g/dL (ref 12.0–15.0)
MCH: 31 pg (ref 26.0–34.0)
MCHC: 34.4 g/dL (ref 30.0–36.0)
MCV: 90.2 fL (ref 80.0–100.0)
Platelets: 284 10*3/uL (ref 150–400)
RBC: 4.71 MIL/uL (ref 3.87–5.11)
RDW: 12.1 % (ref 11.5–15.5)
WBC: 10.5 10*3/uL (ref 4.0–10.5)
nRBC: 0 % (ref 0.0–0.2)

## 2020-04-20 LAB — COMPREHENSIVE METABOLIC PANEL
ALT: 47 U/L — ABNORMAL HIGH (ref 0–44)
AST: 33 U/L (ref 15–41)
Albumin: 4.3 g/dL (ref 3.5–5.0)
Alkaline Phosphatase: 83 U/L (ref 38–126)
Anion gap: 10 (ref 5–15)
BUN: 17 mg/dL (ref 6–20)
CO2: 23 mmol/L (ref 22–32)
Calcium: 9.5 mg/dL (ref 8.9–10.3)
Chloride: 106 mmol/L (ref 98–111)
Creatinine, Ser: 0.67 mg/dL (ref 0.44–1.00)
GFR, Estimated: >60 mL/min (ref >60)
Glucose, Bld: 152 mg/dL — ABNORMAL HIGH (ref 70–99)
Potassium: 3.9 mmol/L (ref 3.5–5.1)
Sodium: 139 mmol/L (ref 135–145)
Total Bilirubin: 0.7 mg/dL (ref 0.3–1.2)
Total Protein: 7.7 g/dL (ref 6.5–8.1)

## 2020-04-20 LAB — POC URINE PREG, ED: Preg Test, Ur: NEGATIVE

## 2020-04-20 LAB — RESP PANEL BY RT-PCR (FLU A&B, COVID) ARPGX2
Influenza A by PCR: NEGATIVE
Influenza B by PCR: NEGATIVE
SARS Coronavirus 2 by RT PCR: NEGATIVE

## 2020-04-20 LAB — TSH: TSH: 0.605 u[IU]/mL (ref 0.350–4.500)

## 2020-04-20 LAB — TROPONIN I (HIGH SENSITIVITY)
Troponin I (High Sensitivity): 9 ng/L (ref <18)
Troponin I (High Sensitivity): 9 ng/L (ref <18)

## 2020-04-20 LAB — MAGNESIUM: Magnesium: 1.5 mg/dL — ABNORMAL LOW (ref 1.7–2.4)

## 2020-04-20 LAB — LIPASE, BLOOD: Lipase: 38 U/L (ref 11–51)

## 2020-04-20 LAB — HIV ANTIBODY (ROUTINE TESTING W REFLEX): HIV Screen 4th Generation wRfx: NONREACTIVE

## 2020-04-20 MED ORDER — SODIUM CHLORIDE 0.9 % IV BOLUS
1000.0000 mL | Freq: Once | INTRAVENOUS | Status: AC
  Administered 2020-04-20: 1000 mL via INTRAVENOUS

## 2020-04-20 MED ORDER — SODIUM CHLORIDE 0.45 % IV SOLN: INTRAVENOUS | Status: DC

## 2020-04-20 MED ORDER — CETIRIZINE HCL 10 MG PO TABS
5.0000 mg | ORAL_TABLET | Freq: Every evening | ORAL | Status: DC
  Filled 2020-04-20 (×2): qty 1

## 2020-04-20 MED ORDER — AMLODIPINE BESYLATE 10 MG PO TABS
10.0000 mg | ORAL_TABLET | Freq: Every day | ORAL | Status: DC
  Administered 2020-04-21: 10 mg via ORAL
  Filled 2020-04-20: qty 1

## 2020-04-20 MED ORDER — ONDANSETRON HCL 4 MG PO TABS: 4.0000 mg | ORAL_TABLET | Freq: Four times a day (QID) | ORAL | Status: DC | PRN

## 2020-04-20 MED ORDER — MAGNESIUM SULFATE 2 GM/50ML IV SOLN
2.0000 g | Freq: Once | INTRAVENOUS | Status: AC
  Administered 2020-04-20: 2 g via INTRAVENOUS
  Filled 2020-04-20: qty 50

## 2020-04-20 MED ORDER — ESCITALOPRAM OXALATE 10 MG PO TABS
5.0000 mg | ORAL_TABLET | Freq: Every day | ORAL | Status: DC
  Administered 2020-04-21: 5 mg via ORAL
  Filled 2020-04-20: qty 0.5

## 2020-04-20 MED ORDER — PANTOPRAZOLE SODIUM 40 MG IV SOLR
40.0000 mg | INTRAVENOUS | Status: DC
  Administered 2020-04-20: 40 mg via INTRAVENOUS
  Filled 2020-04-20: qty 40

## 2020-04-20 MED ORDER — ENOXAPARIN SODIUM 60 MG/0.6ML ~~LOC~~ SOLN
0.5000 mg/kg | SUBCUTANEOUS | Status: DC
  Administered 2020-04-20 – 2020-04-21 (×2): 52.5 mg via SUBCUTANEOUS
  Filled 2020-04-20 (×2): qty 0.6

## 2020-04-20 MED ORDER — HYDRALAZINE HCL 20 MG/ML IJ SOLN
10.0000 mg | Freq: Four times a day (QID) | INTRAMUSCULAR | Status: DC | PRN
  Administered 2020-04-20 – 2020-04-21 (×2): 10 mg via INTRAVENOUS
  Filled 2020-04-20 (×2): qty 1

## 2020-04-20 MED ORDER — METOCLOPRAMIDE HCL 5 MG/ML IJ SOLN
10.0000 mg | Freq: Once | INTRAMUSCULAR | Status: AC
  Administered 2020-04-20: 10 mg via INTRAVENOUS
  Filled 2020-04-20: qty 2

## 2020-04-20 MED ORDER — ONDANSETRON HCL 4 MG/2ML IJ SOLN
4.0000 mg | Freq: Four times a day (QID) | INTRAMUSCULAR | Status: DC | PRN
  Administered 2020-04-20 – 2020-04-21 (×3): 4 mg via INTRAVENOUS
  Filled 2020-04-20 (×3): qty 2

## 2020-04-20 MED ORDER — FLUTICASONE PROPIONATE 50 MCG/ACT NA SUSP
2.0000 | Freq: Every day | NASAL | Status: DC | PRN
  Filled 2020-04-20: qty 16

## 2020-04-20 NOTE — ED Notes (Signed)
tried to ask if she wanted her zyrtec; pt sleeping

## 2020-04-20 NOTE — ED Notes (Signed)
MD  Vicente Males made aware that patient having chills and diaphoretic. Pt attempted po challenge followed by episode of emesis. covid sab, EKG , and Troponin obtained. md Vicente Males also made aware of Bradycardia pt beart rate in 40s-50s. Will continue to monitor and assess. Pt laying in bed comfortably at this time.

## 2020-04-20 NOTE — ED Notes (Signed)
Attending notified of mg 1.5

## 2020-04-20 NOTE — Progress Notes (Signed)
PHARMACIST - PHYSICIAN COMMUNICATION  CONCERNING:  Enoxaparin (Lovenox) for DVT Prophylaxis    RECOMMENDATION: Patient was prescribed enoxaprin 40mg  q24 hours for VTE prophylaxis.   Filed Weights   04/20/20 0940  Weight: 106.6 kg (235 lb)    Body mass index is 41.63 kg/m.  Estimated Creatinine Clearance: 115.9 mL/min (by C-G formula based on SCr of 0.67 mg/dL).   Based on [facility] policy patient is candidate for enoxaparin 0.5mg /kg TBW SQ every 24 hours based on BMI being >30.  DESCRIPTION: Pharmacy has adjusted enoxaparin dose per [facility] policy.  Patient is now receiving enoxaparin 52.5 mg every 24 hours   [facility], PharmD Pharmacy Resident  04/20/2020 1:42 PM

## 2020-04-20 NOTE — Consult Note (Signed)
[facility] Consultation [facility]  Patient ID: Bonnie Shepard, MRN: [id number], DOB/AGE: Dec 19, 1985 35 y.o. Admit date: 04/20/2020   Date of Consult: 04/20/2020 Primary Physician: Berniece PapFlinchum, Michelle S, FNP Primary Cardiologist: None  Chief Complaint:  Chief Complaint  Patient presents with  . Emesis   Reason for Consult: Bradycardia  HPI: 35 y.o. female with no evidence of previous cardiovascular disease hypertension hyperlipidemia and or diabetes who has had recurrent episodes of nausea and vomiting approximately every month.  This is.  Becoming more regular.  She says typically she just pushes through it and feels well after her diet.  She was at work today having the same significant symptoms with some diaphoresis as well as some nausea vomiting and weakness.  There is no evidence of dizziness or syncope.  The patient did have an EKG showing sinus bradycardia at 41 bpm otherwise normal.  The patient does have a change in heart rate into the 60 and 70 bpm range when mobile and or having some distress.  She has a troponin of 9 and otherwise electrolytes are normal.  The patient is hemodynamically stable at this time.  There has been no evidence of telemetry changes concerning for symptomatic bradycardia and/or advanced heart block  Past Medical History:  Diagnosis Date  . Acid reflux   . Asthma   . Family history of adverse reaction to anesthesia    Mother - PONV  . Infection    freq UTI  . Vaginal Pap smear, abnormal    f/u wnl  . Wears contact lenses       Surgical History:  Past Surgical History:  Procedure Laterality Date  . COLONOSCOPY WITH PROPOFOL N/A 04/03/2019   Procedure: COLONOSCOPY WITH PROPOFOL;  Surgeon: Midge MiniumWohl, Darren, MD;  [facility];  Service: Endoscopy;  Laterality: N/A;  Priority 3  . DILATION AND EVACUATION N/A 09/07/2013   Procedure: DILATATION AND EVACUATION;  Surgeon: Freddrick MarchKendra H. Tenny Crawoss, MD;  Location: [facility];  Service: Gynecology;   Laterality: N/A;  . ESOPHAGOGASTRODUODENOSCOPY (EGD) WITH PROPOFOL N/A 04/04/2020   Procedure: ESOPHAGOGASTRODUODENOSCOPY (EGD) WITH BIOPSY;  Surgeon: Midge MiniumWohl, Darren, MD;  [facility];  Service: Endoscopy;  Laterality: N/A;  . TONSILLECTOMY       Home Meds: Prior to Admission medications   Medication Sig Start Date End Date Taking? Authorizing Provider  albuterol (VENTOLIN HFA) 108 (90 Base) MCG/ACT inhaler Inhale 2 puffs into the lungs every 4 (four) hours as needed. 03/28/20  Yes [provider]  Azelastine HCl 0.15 % SOLN PLACE 2 SPRAYS INTO THE NOSE DAILY. 02/07/20 02/06/21 Yes Flinchum, Eula FriedMichelle S, FNP  escitalopram (LEXAPRO) 5 MG tablet TAKE 1 TABLET BY MOUTH DAILY. 03/28/20 03/28/21 Yes Flinchum, Eula FriedMichelle S, FNP  fluticasone (FLONASE) 50 MCG/ACT nasal spray PLACE 2 SPRAYS INTO BOTH NOSTRILS DAILY. 03/28/20 03/28/21 Yes Flinchum, Eula FriedMichelle S, FNP  levocetirizine (XYZAL) 5 MG tablet TAKE 1 TABLET BY MOUTH EVERY EVENING. Patient taking differently: Take 5 mg by mouth every evening. 03/28/20 03/28/21 Yes Flinchum, Eula FriedMichelle S, FNP  ondansetron (ZOFRAN) 8 MG tablet TAKE 1 TABLET BY MOUTH EVERY 8 HOURS AS NEEDED FOR NAUSEA OR VOMITING. 03/28/20 03/28/21 Yes Flinchum, Eula FriedMichelle S, FNP  ondansetron (ZOFRAN-ODT) 8 MG disintegrating tablet DISSOLVE 1 TABLET BY MOUTH EVERY 8 HOURS AS NEEDED FOR UP TO 7 DAYS FOR NAUSEA OR VOMITING. 01/18/20 01/17/21 Yes Charise KillianWilliams, Jamiese M, MD  pantoprazole (PROTONIX) 40 MG tablet TAKE 1 TABLET BY MOUTH DAILY. Patient taking differently: Take 40 mg by mouth  2 (two) times daily. 03/28/20 03/28/21 Yes Flinchum, Eula Fried, FNP  Vitamin D, Ergocalciferol, (DRISDOL) 1.25 MG (50000 UNIT) CAPS capsule TAKE 1 CAPSULE BY MOUTH EVERY 7 (SEVEN) DAYS. WALK IN LAB IN OFFICE 1-2 WEEKS AFTER COMPLETING PRESCRIPTION. Patient taking differently: Take 50,000 Units by mouth every 7 (seven) days. 03/06/20 03/06/21 Yes Flinchum, Eula Fried, FNP  amoxicillin-clavulanate (AUGMENTIN)  875-125 MG tablet TAKE 1 TABLET BY MOUTH 2 TIMES DAILY. Patient not taking: No sig reported 03/28/20 03/28/21  Flinchum, Eula Fried, FNP  amoxicillin-clavulanate (AUGMENTIN) 875-125 MG tablet TAKE 1 TABLET BY MOUTH EVERY 12 HOURS FOR 4 DAYS. Patient not taking: No sig reported 01/18/20 01/17/21  Charise Killian, MD  Ciprofloxacin HCl 0.2 % otic solution PLACE 0.2 ML INTO THE RIGHT EAR 2 TIMES DAILY. Patient not taking: No sig reported 02/07/20 02/06/21  Flinchum, Eula Fried, FNP  hydrochlorothiazide (HYDRODIURIL) 25 MG tablet TAKE 0.5 TABLETS BY MOUTH DAILY. Patient not taking: No sig reported 03/28/20 03/28/21  Flinchum, Eula Fried, FNP  methocarbamol (ROBAXIN) 500 MG tablet TAKE 1 TABLET BY MOUTH 3 TIMES A DAY BY MOUTH FOR 14 DAYS. Patient taking differently: Take 500 mg by mouth daily. 12/15/19 12/14/20  Ivar Drape, PA-C  methylPREDNISolone (MEDROL DOSEPAK) 4 MG TBPK tablet TAKE TABLETS BY MOUTH DAILY AS DIRECTED ON PACKAGE Patient not taking: No sig reported 12/15/19 12/14/20  Ivar Drape, PA-C  predniSONE (DELTASONE) 10 MG tablet TAKE ALL 6 TABLETS BY MOUTH ON DAY 1, THEN DECREASE BY ONE TABLET EACH DAY (6-5-4-3-2-1) Patient not taking: No sig reported 02/07/20 02/06/21  Flinchum, Eula Fried, FNP  predniSONE (STERAPRED UNI-PAK 21 TAB) 10 MG (21) TBPK tablet PO: Take 6 tablets on day 1:Take 5 tablets day 2:Take 4 tablets day 3: Take 3 tablets day 4:Take 2 tablets day five: 5 Take 1 tablet day 6 Patient not taking: No sig reported 02/07/20   Flinchum, Eula Fried, FNP    Inpatient Medications:  . amLODipine  10 mg Oral Daily  . enoxaparin (LOVENOX) injection  0.5 mg/kg Subcutaneous Q24H  . escitalopram  5 mg Oral Daily  . fluticasone  2 spray Each Nare Daily  . levocetirizine  5 mg Oral QPM   . sodium chloride 75 mL/hr at 04/20/20 1442  . magnesium sulfate bolus IVPB      Allergies: No Known Allergies  Social History   Socioeconomic History  . Marital status: Single    Spouse name: Not  on file  . Number of children: Not on file  . Years of education: Not on file  . Highest education level: Not on file  Occupational History  . Not on file  Tobacco Use  . Smoking status: Current Every Day Smoker    Packs/day: 0.25    Years: 16.00    Pack years: 4.00    Types: Cigarettes  . Smokeless tobacco: Never Used  . Tobacco comment: since age 89  Vaping Use  . Vaping Use: Never used  Substance and Sexual Activity  . Alcohol use: No    Comment: occasionally  . Drug use: No  . Sexual activity: Yes    Birth control/protection: None  Other Topics Concern  . Not on file  Social History Narrative  . Not on file   Social Determinants of Health   Financial Resource Strain: Not on file  Food Insecurity: Not on file  Transportation Needs: Not on file  Physical Activity: Not on file  Stress: Not on file  Social Connections: Not on file  Intimate Partner Violence: Not on file     Family History  Problem Relation Age of Onset  . Hypertension Other   . Cancer Other   . Hypertension Mother   . Hypertension Father   . Hypertension Maternal Grandmother   . Heart disease Maternal Grandmother   . Hearing loss Neg Hx      Review of Systems Positive for nausea vomiting Negative for: General:  chills, fever, night sweats or weight changes.  Cardiovascular: PND orthopnea syncope dizziness  Dermatological skin lesions rashes Respiratory: Cough congestion Urologic: Frequent urination urination at night and hematuria Abdominal: Positive for nausea, vomiting, negative for diarrhea, bright red blood per rectum, melena, or hematemesis Neurologic: negative for visual changes, and/or hearing changes  All other systems reviewed and are otherwise negative except as noted above.  Labs: No results for input(s): CKTOTAL, CKMB, TROPONINI in the last 72 hours. Lab Results  Component Value Date   WBC 10.5 04/20/2020   HGB 14.6 04/20/2020   HCT 42.5 04/20/2020   MCV 90.2 04/20/2020    PLT 284 04/20/2020    Recent Labs  Lab 04/20/20 0952  NA 139  K 3.9  CL 106  CO2 23  BUN 17  CREATININE 0.67  CALCIUM 9.5  PROT 7.7  BILITOT 0.7  ALKPHOS 83  ALT 47*  AST 33  GLUCOSE 152*   Lab Results  Component Value Date   CHOL 181 01/20/2019   HDL 38 (L) 01/20/2019   LDLCALC 126 (H) 01/20/2019   TRIG 92 01/20/2019   Lab Results  Component Value Date   DDIMER (H) 01/23/2008    0.99        AT THE INHOUSE ESTABLISHED CUTOFF VALUE OF 0.48 ug/mL FEU, THIS ASSAY HAS BEEN DOCUMENTED IN THE LITERATURE TO HAVE A SENSITIVITY AND NEGATIVE PREDICTIVE VALUE OF AT LEAST 98 TO 99%.  THE TEST RESULT SHOULD BE CORRELATED WITH AN ASSESSMENT OF THE CLINICAL PROBABILITY OF DVT / VTE.    Radiology/Studies:  No results found.  EKG: Sinus bradycardia  Weights: Filed Weights   04/20/20 0940  Weight: 106.6 kg     Physical Exam: Blood pressure (!) 190/92, pulse (!) 43, temperature 97.7 F (36.5 C), temperature source Oral, resp. rate 18, height 5\' 3"  (1.6 m), weight 106.6 kg, last menstrual period 03/17/2020, SpO2 97 %. Body mass index is 41.63 kg/m. General: Well developed, well nourished, in no acute distress. Head eyes ears nose throat: Normocephalic, atraumatic, sclera non-icteric, no xanthomas, nares are without discharge. No apparent thyromegaly and/or mass  Lungs: Normal respiratory effort.  no wheezes, no rales, no rhonchi.  Heart: RRR with normal S1 S2. no murmur gallop, no rub, PMI is normal size and placement, carotid upstroke normal without bruit, jugular venous pressure is normal Abdomen: Soft, non-tender, non-distended with normoactive bowel sounds. No hepatomegaly. No rebound/guarding. No obvious abdominal masses. Abdominal aorta is normal size without bruit Extremities: No edema. no cyanosis, no clubbing, no ulcers  Peripheral : 2+ bilateral upper extremity pulses, 2+ bilateral femoral pulses, 2+ bilateral dorsal pedal pulse Neuro: Alert and oriented. No  facial asymmetry. No focal deficit. Moves all extremities spontaneously. Musculoskeletal: Normal muscle tone without kyphosis Psych:  Responds to questions appropriately with a normal affect.    Assessment: 35 year old female with nausea vomiting and no evidence of previous cardiovascular history having asymptomatic bradycardia and no evidence of advanced heart block  Plan: 1.  Continue medication management and treatment of her nausea and vomiting without restriction 2.  Continue  telemetry during above watching for symptomatic bradycardia and/or advanced heart block or other symptoms including syncope 3.  No further cardiac diagnostics necessary at this time  Signed, Lamar Blinks M.D. [facility] 04/20/2020, 3:06 PM

## 2020-04-20 NOTE — ED Triage Notes (Signed)
Pt to ED for N/V that started a few hours ago but has been chronic for a year, has been given zofran from PCP but if not effective.  Pt vomiting in treatment room

## 2020-04-20 NOTE — H&P (Signed)
History and Physical    [name] ZSW:[id number] DOB: 11/05/1985 DOA: 04/20/2020  PCP: Berniece Pap, FNP   Patient coming from: Home  I have personally briefly reviewed patient's old medical records in [facility]  Chief Complaint: Dizziness/lightheadedness  HPI: Bonnie Shepard is a 35 y.o. female with medical history significant for morbid obesity, GERD, hypertension and asthma who presents to the emergency room for evaluation of dizziness and lightheadedness.  Patient works in housekeeping and while at work today she developed nausea and vomiting.  She has had issues with nausea and vomiting and has Zofran prescribed as an outpatient which did not provide any significant relief. She states that she broke out in a sweat and felt very dizzy and lightheaded and was advised by her supervisor to go to the emergency room to get checked. In the ER she was noted to be bradycardic with heart rate in the 40s at rest and blood pressure was elevated. She denies having any abdominal pain, no fever, no chills, no cough, no constipation, no diarrhea, no urinary frequency, no nocturia, no dysuria, no headache, no blurred vision, no difficulty swallowing, no chest pain, no shortness of breath, no palpitations or diarrhea. Labs show sodium 139, potassium 3.9, chloride 106, bicarb 23, glucose 152, BUN 17, creatinine 0.67, calcium 9.5, alkaline phosphatase 83, albumin 4.3, lipase 38, AST 33, ALT 47, total protein 7.7, Bascom count 10.5, hemoglobin 14.6, hematocrit 42 point MCV 90.2, RDW 12.1, platelet count 284 Urine pregnancy test is negative Respiratory viral panel is negative    ED Course: Patient is a 35 year old female who presents to the ER for evaluation of refractory nausea and vomiting was noted to be bradycardic with heart rate in the 40s at rest.  Admission was requested for evaluation of bradycardia.  She will be placed in observation status.  Review of Systems: As per HPI otherwise all  other systems reviewed and negative.    Past Medical History:  Diagnosis Date  . Acid reflux   . Asthma   . Family history of adverse reaction to anesthesia    Mother - PONV  . Infection    freq UTI  . Vaginal Pap smear, abnormal    f/u wnl  . Wears contact lenses     Past Surgical History:  Procedure Laterality Date  . COLONOSCOPY WITH PROPOFOL N/A 04/03/2019   Procedure: COLONOSCOPY WITH PROPOFOL;  Surgeon: Midge Minium, MD;  [facility];  Service: Endoscopy;  Laterality: N/A;  Priority 3  . DILATION AND EVACUATION N/A 09/07/2013   Procedure: DILATATION AND EVACUATION;  Surgeon: Freddrick March. Tenny Craw, MD;  Location: [facility];  Service: Gynecology;  Laterality: N/A;  . ESOPHAGOGASTRODUODENOSCOPY (EGD) WITH PROPOFOL N/A 04/04/2020   Procedure: ESOPHAGOGASTRODUODENOSCOPY (EGD) WITH BIOPSY;  Surgeon: Midge Minium, MD;  [facility];  Service: Endoscopy;  Laterality: N/A;  . TONSILLECTOMY       reports that she has been smoking cigarettes. She has a 4.00 pack-year smoking history. She has never used smokeless tobacco. She reports that she does not drink alcohol and does not use drugs.  No Known Allergies  Family History  Problem Relation Age of Onset  . Hypertension Other   . Cancer Other   . Hypertension Mother   . Hypertension Father   . Hypertension Maternal Grandmother   . Heart disease Maternal Grandmother   . Hearing loss Neg Hx       Prior to Admission medications   Medication Sig Start Date  End Date Taking? Authorizing Provider  amoxicillin-clavulanate (AUGMENTIN) 875-125 MG tablet TAKE 1 TABLET BY MOUTH 2 TIMES DAILY. 03/28/20 03/28/21  Flinchum, Eula Fried, FNP  amoxicillin-clavulanate (AUGMENTIN) 875-125 MG tablet TAKE 1 TABLET BY MOUTH EVERY 12 HOURS FOR 4 DAYS. 01/18/20 01/17/21  Charise Killian, MD  Azelastine HCl 0.15 % SOLN PLACE 2 SPRAYS INTO THE NOSE DAILY. Patient not taking: Reported on 03/29/2020 02/07/20 02/06/21  Flinchum, Eula Fried, FNP  Ciprofloxacin HCl 0.2 % otic solution PLACE 0.2 ML INTO THE RIGHT EAR 2 TIMES DAILY. 02/07/20 02/06/21  Flinchum, Eula Fried, FNP  escitalopram (LEXAPRO) 5 MG tablet TAKE 1 TABLET BY MOUTH DAILY. 03/28/20 03/28/21  Flinchum, Eula Fried, FNP  fluticasone (FLONASE) 50 MCG/ACT nasal spray PLACE 2 SPRAYS INTO BOTH NOSTRILS DAILY. 03/28/20 03/28/21  Flinchum, Eula Fried, FNP  hydrochlorothiazide (HYDRODIURIL) 25 MG tablet TAKE 0.5 TABLETS BY MOUTH DAILY. Patient not taking: Reported on 03/29/2020 03/28/20 03/28/21  Flinchum, Eula Fried, FNP  levocetirizine (XYZAL) 5 MG tablet TAKE 1 TABLET BY MOUTH EVERY EVENING. 03/28/20 03/28/21  Flinchum, Eula Fried, FNP  methocarbamol (ROBAXIN) 500 MG tablet TAKE 1 TABLET BY MOUTH 3 TIMES A DAY BY MOUTH FOR 14 DAYS. 12/15/19 12/14/20  Ivar Drape, PA-C  methylPREDNISolone (MEDROL DOSEPAK) 4 MG TBPK tablet TAKE TABLETS BY MOUTH DAILY AS DIRECTED ON PACKAGE 12/15/19 12/14/20  Ivar Drape, PA-C  ondansetron (ZOFRAN) 8 MG tablet TAKE 1 TABLET BY MOUTH EVERY 8 HOURS AS NEEDED FOR NAUSEA OR VOMITING. 03/28/20 03/28/21  Flinchum, Eula Fried, FNP  ondansetron (ZOFRAN-ODT) 8 MG disintegrating tablet DISSOLVE 1 TABLET BY MOUTH EVERY 8 HOURS AS NEEDED FOR UP TO 7 DAYS FOR NAUSEA OR VOMITING. 01/18/20 01/17/21  Charise Killian, MD  pantoprazole (PROTONIX) 40 MG tablet TAKE 1 TABLET BY MOUTH DAILY. 03/28/20 03/28/21  Flinchum, Eula Fried, FNP  predniSONE (DELTASONE) 10 MG tablet TAKE ALL 6 TABLETS BY MOUTH ON DAY 1, THEN DECREASE BY ONE TABLET EACH DAY (6-5-4-3-2-1) 02/07/20 02/06/21  Flinchum, Eula Fried, FNP  predniSONE (STERAPRED UNI-PAK 21 TAB) 10 MG (21) TBPK tablet PO: Take 6 tablets on day 1:Take 5 tablets day 2:Take 4 tablets day 3: Take 3 tablets day 4:Take 2 tablets day five: 5 Take 1 tablet day 6 02/07/20   Flinchum, Eula Fried, FNP  Vitamin D, Ergocalciferol, (DRISDOL) 1.25 MG (50000 UNIT) CAPS capsule TAKE 1 CAPSULE BY MOUTH EVERY 7 (SEVEN) DAYS. WALK IN LAB IN OFFICE 1-2 WEEKS  AFTER COMPLETING PRESCRIPTION. 03/06/20 03/06/21  Flinchum, Eula Fried, FNP  albuterol (VENTOLIN HFA) 108 (90 Base) MCG/ACT inhaler Inhale 2 puffs into the lungs every 6 (six) hours as needed for wheezing or shortness of breath. 03/28/20 03/28/20  Berniece Pap, FNP    Physical Exam: Vitals:   04/20/20 1130 04/20/20 1200 04/20/20 1230 04/20/20 1300  BP: (!) 172/88 (!) 154/85 (!) 180/104 (!) 172/99  Pulse: (!) 50 (!) 52 (!) 43 (!) 42  Resp: (!) 28 15 17 18   Temp:      TempSrc:      SpO2: 98% 99% 95% 94%  Weight:      Height:         Vitals:   04/20/20 1130 04/20/20 1200 04/20/20 1230 04/20/20 1300  BP: (!) 172/88 (!) 154/85 (!) 180/104 (!) 172/99  Pulse: (!) 50 (!) 52 (!) 43 (!) 42  Resp: (!) 28 15 17 18   Temp:      TempSrc:      SpO2: 98% 99% 95% 94%  Weight:  Height:          Constitutional:  Sleeping but arouses easily.  Not in any apparent distress.  Morbidly obese HEENT:      Head: Normocephalic and atraumatic.         Eyes: PERLA, EOMI, Conjunctivae pallor. Sclera is non-icteric.       Mouth/Throat: Mucous membranes are moist.       Neck: Supple with no signs of meningismus. Cardiovascular:  Bradycardic. No murmurs, gallops, or rubs. 2+ symmetrical distal pulses are present . No JVD. No LE edema Respiratory: Respiratory effort normal .Lungs sounds clear bilaterally. No wheezes, crackles, or rhonchi.  Gastrointestinal: Soft, non tender, and non distended with positive bowel sounds.  Central adiposity Genitourinary: No CVA tenderness. Musculoskeletal: Nontender with normal range of motion in all extremities. No cyanosis, or erythema of extremities. Neurologic:  Face is symmetric. Moving all extremities. No gross focal neurologic deficits  Skin: Skin is warm, dry.  No rash or ulcers Psychiatric: Mood and affect are normal   Labs on Admission: I have personally reviewed following labs and imaging studies  CBC: Recent Labs  Lab 04/20/20 0952  WBC 10.5  HGB  14.6  HCT 42.5  MCV 90.2  PLT 284   Basic Metabolic Panel: Recent Labs  Lab 04/20/20 0952  NA 139  K 3.9  CL 106  CO2 23  GLUCOSE 152*  BUN 17  CREATININE 0.67  CALCIUM 9.5   GFR: Estimated Creatinine Clearance: 115.9 mL/min (by C-G formula based on SCr of 0.67 mg/dL). Liver Function Tests: Recent Labs  Lab 04/20/20 0952  AST 33  ALT 47*  ALKPHOS 83  BILITOT 0.7  PROT 7.7  ALBUMIN 4.3   Recent Labs  Lab 04/20/20 0952  LIPASE 38   No results for input(s): AMMONIA in the last 168 hours. Coagulation Profile: No results for input(s): INR, PROTIME in the last 168 hours. Cardiac Enzymes: No results for input(s): CKTOTAL, CKMB, CKMBINDEX, TROPONINI in the last 168 hours. BNP (last 3 results) No results for input(s): PROBNP in the last 8760 hours. HbA1C: No results for input(s): HGBA1C in the last 72 hours. CBG: No results for input(s): GLUCAP in the last 168 hours. Lipid Profile: No results for input(s): CHOL, HDL, LDLCALC, TRIG, CHOLHDL, LDLDIRECT in the last 72 hours. Thyroid Function Tests: No results for input(s): TSH, T4TOTAL, FREET4, T3FREE, THYROIDAB in the last 72 hours. Anemia Panel: No results for input(s): VITAMINB12, FOLATE, FERRITIN, TIBC, IRON, RETICCTPCT in the last 72 hours. Urine analysis:    Component Value Date/Time   COLORURINE STRAW (A) 04/20/2020 0953   APPEARANCEUR CLEAR (A) 04/20/2020 0953   LABSPEC 1.013 04/20/2020 0953   PHURINE 6.0 04/20/2020 0953   GLUCOSEU 50 (A) 04/20/2020 0953   HGBUR MODERATE (A) 04/20/2020 0953   BILIRUBINUR NEGATIVE 04/20/2020 0953   KETONESUR 20 (A) 04/20/2020 0953   PROTEINUR NEGATIVE 04/20/2020 0953   UROBILINOGEN 0.2 10/16/2015 0840   NITRITE NEGATIVE 04/20/2020 0953   LEUKOCYTESUR NEGATIVE 04/20/2020 0953    Radiological Exams on Admission: No results found.   Assessment/Plan Principal Problem:   Bradycardia with 41-50 beats per minute Active Problems:   Anxiety with depression   Body mass  index (BMI) of 40.1-44.9 in adult [facility](HCC)   GERD (gastroesophageal reflux disease)   Hypertension   Refractory nausea and vomiting     Bradycardia Patient noted to have heart rate in the 40s at rest Bradycardia may be secondary to undiagnosed obstructive sleep apnea related to her morbid obesity  We will check a TSH level Obtain 2D echocardiogram to assess LVEF We will consult cardiology   Morbid obesity (BMI 41) Complicates overall prognosis and care   Refractory nausea and vomiting Chronic Patient had a recent upper endoscopy done in March, 2022 did not show any acute findings Keep patient n.p.o. for now,  Supportive care with antiemetics and IV PPI    Hypertension Uncontrolled Start patient amlodipine 10 mg daily   Depression Continue Lexapro  DVT prophylaxis: Lovenox Code Status: full code Family Communication: Greater than 50% of time was spent discussing patient's condition and plan of care with her at the bedside.  All questions and concerns have been addressed.  She verbalizes understanding and agrees with the plan. Disposition Plan: Back to previous home environment Consults called: Cardiology Status: Observation    Janmichael Giraud MD [facility]     04/20/2020, 1:36 PM

## 2020-04-20 NOTE — ED Notes (Signed)
Md made aware of elevated BP. MD will order IV BP medication.

## 2020-04-21 ENCOUNTER — Observation Stay
Admit: 2020-04-21 | Discharge: 2020-04-21 | Disposition: A | Discharge status: Home / Self Care | Payer: No Typology Code available for payment source | Attending: Internal Medicine | Admitting: Internal Medicine

## 2020-04-21 DIAGNOSIS — R001 Bradycardia, unspecified: Secondary | ICD-10-CM | POA: Diagnosis not present

## 2020-04-21 LAB — URINE DRUG SCREEN, QUALITATIVE (ARMC ONLY)
Amphetamines, Ur Screen: NOT DETECTED
Barbiturates, Ur Screen: NOT DETECTED
Benzodiazepine, Ur Scrn: NOT DETECTED
Cannabinoid 50 Ng, Ur ~~LOC~~: POSITIVE — AB
Cocaine Metabolite,Ur ~~LOC~~: NOT DETECTED
MDMA (Ecstasy)Ur Screen: NOT DETECTED
Methadone Scn, Ur: NOT DETECTED
Opiate, Ur Screen: NOT DETECTED
Phencyclidine (PCP) Ur S: NOT DETECTED
Tricyclic, Ur Screen: NOT DETECTED

## 2020-04-21 LAB — BASIC METABOLIC PANEL
Anion gap: 12 (ref 5–15)
BUN: 12 mg/dL (ref 6–20)
CO2: 25 mmol/L (ref 22–32)
Calcium: 9 mg/dL (ref 8.9–10.3)
Chloride: 101 mmol/L (ref 98–111)
Creatinine, Ser: 0.63 mg/dL (ref 0.44–1.00)
GFR, Estimated: >60 mL/min (ref >60)
Glucose, Bld: 138 mg/dL — ABNORMAL HIGH (ref 70–99)
Potassium: 3 mmol/L — ABNORMAL LOW (ref 3.5–5.1)
Sodium: 138 mmol/L (ref 135–145)

## 2020-04-21 LAB — MAGNESIUM: Magnesium: 1.8 mg/dL (ref 1.7–2.4)

## 2020-04-21 LAB — CBC
HCT: 41.9 % (ref 36.0–46.0)
Hemoglobin: 15 g/dL (ref 12.0–15.0)
MCH: 31.6 pg (ref 26.0–34.0)
MCHC: 35.8 g/dL (ref 30.0–36.0)
MCV: 88.2 fL (ref 80.0–100.0)
Platelets: 278 10*3/uL (ref 150–400)
RBC: 4.75 MIL/uL (ref 3.87–5.11)
RDW: 12.3 % (ref 11.5–15.5)
WBC: 15 10*3/uL — ABNORMAL HIGH (ref 4.0–10.5)
nRBC: 0 % (ref 0.0–0.2)

## 2020-04-21 MED ORDER — POTASSIUM CHLORIDE 10 MEQ/100ML IV SOLN
10.0000 meq | INTRAVENOUS | Status: AC
  Administered 2020-04-21 (×6): 10 meq via INTRAVENOUS
  Filled 2020-04-21 (×6): qty 100

## 2020-04-21 MED ORDER — ADULT MULTIVITAMIN W/MINERALS CH
1.0000 | ORAL_TABLET | Freq: Every day | ORAL | Status: DC
  Administered 2020-04-21: 1 via ORAL
  Filled 2020-04-21: qty 1

## 2020-04-21 MED ORDER — ACETAMINOPHEN 325 MG PO TABS
650.0000 mg | ORAL_TABLET | Freq: Four times a day (QID) | ORAL | Status: DC | PRN
  Administered 2020-04-21: 650 mg via ORAL
  Filled 2020-04-21: qty 2

## 2020-04-21 MED ORDER — BOOST / RESOURCE BREEZE PO LIQD CUSTOM
1.0000 | Freq: Three times a day (TID) | ORAL | Status: DC
  Administered 2020-04-21: 1 via ORAL

## 2020-04-21 NOTE — Plan of Care (Signed)
  Problem: Health Behavior/Discharge Planning: Goal: Ability to manage health-related needs will improve Outcome: Progressing   Problem: Clinical Measurements: Goal: Respiratory complications will improve Outcome: Progressing   Problem: Clinical Measurements: Goal: Cardiovascular complication will be avoided Outcome: Progressing   Problem: Nutrition: Goal: Adequate nutrition will be maintained Outcome: Progressing   

## 2020-04-21 NOTE — Progress Notes (Addendum)
Pt is NPO except sip with medicine but is requesting ice chips. Also, pt airborne/contact still active but covid result came back negative. MD Mansy made aware. Will continue to monitor.\  Update 0145: MD mansy ordered to discontinue airborne/copnatct isolation and to add ice chips on pt diet. Will continue to monitor.

## 2020-04-21 NOTE — Progress Notes (Signed)
*  PRELIMINARY RESULTS* Echocardiogram 2D Echocardiogram has been performed.  Lenor Coffin 04/21/2020, 1:43 PM

## 2020-04-21 NOTE — Discharge Summary (Signed)
Physician Discharge Summary  Bonnie Shepard OXB:[id number] DOB: 11-28-1985 DOA: 04/20/2020  PCP: Berniece Pap, FNP  Admit date: 04/20/2020 Discharge date: 04/21/2020  Admitted From: Home Disposition: Home  Recommendations for Outpatient Follow-up:  1. Follow up with PCP in 1-2 weeks 2. Please obtain BMP/CBC in one week 3. Please follow up on the following pending results: None  Home Health: No Equipment/Devices: None Discharge Condition: Stable CODE STATUS: Full Diet recommendation: Heart Healthy    Brief/Interim Summary: Bonnie Shepard is a 35 y.o. female with medical history significant for morbid obesity, GERD, hypertension and asthma who presents to the emergency room for evaluation of dizziness and lightheadedness.  Patient works in housekeeping and while at work today she developed nausea and vomiting.  She has had issues with nausea and vomiting and has Zofran prescribed as an outpatient which did not provide any significant relief. She states that she broke out in a sweat and felt very dizzy and lightheaded and was advised by her supervisor to go to the emergency room to get checked. In the ER she was noted to be bradycardic with heart rate in the 40s at rest and blood pressure was elevated. Cardiology was consulted for concern of symptomatic bradycardia.  EKG without any heart block and shows sinus bradycardia which improved on its own.  Patient seems to have cyclical nausea and vomiting which has been extensively worked out by GI as an outpatient and they did not find any reason.  She was able to tolerate diet with some intermittent nausea.  She will continue home antiemetics as needed.  She will continue rest of her home medications and follow-up with her providers.  Discharge Diagnoses:  Principal Problem:   Bradycardia with 41-50 beats per minute Active Problems:   Anxiety with depression   Body mass index (BMI) of 40.1-44.9 in adult [facility])   GERD (gastroesophageal reflux  disease)   Hypertension   Refractory nausea and vomiting   Discharge Instructions  Discharge Instructions    Diet - low sodium heart healthy   Complete by: As directed    Discharge instructions   Complete by: As directed    It was pleasure taking care of you. As we discussed please follow-up with your gastroenterologist and gynecologist to see if there is any cyclical element of your nausea and vomiting. Continue taking as needed Zofran or Phenergan as you have them at home. Keep yourself well-hydrated   Increase activity slowly   Complete by: As directed      Allergies as of 04/21/2020   No Known Allergies     Medication List    STOP taking these medications   amoxicillin-clavulanate 875-125 MG tablet Commonly known as: AUGMENTIN   Ciprofloxacin HCl 0.2 % otic solution   methylPREDNISolone 4 MG Tbpk tablet Commonly known as: MEDROL DOSEPAK   ondansetron 8 MG tablet Commonly known as: ZOFRAN   predniSONE 10 MG (21) Tbpk tablet Commonly known as: STERAPRED UNI-PAK 21 TAB   predniSONE 10 MG tablet Commonly known as: DELTASONE     TAKE these medications   albuterol 108 (90 Base) MCG/ACT inhaler Commonly known as: VENTOLIN HFA Inhale 2 puffs into the lungs every 4 (four) hours as needed.   Azelastine HCl 0.15 % Soln PLACE 2 SPRAYS INTO THE NOSE DAILY.   escitalopram 5 MG tablet Commonly known as: LEXAPRO TAKE 1 TABLET BY MOUTH DAILY.   fluticasone 50 MCG/ACT nasal spray Commonly known as: FLONASE PLACE 2 SPRAYS INTO BOTH NOSTRILS DAILY.  hydrochlorothiazide 25 MG tablet Commonly known as: HYDRODIURIL TAKE 0.5 TABLETS BY MOUTH DAILY.   levocetirizine 5 MG tablet Commonly known as: XYZAL TAKE 1 TABLET BY MOUTH EVERY EVENING. What changed: how much to take   methocarbamol 500 MG tablet Commonly known as: ROBAXIN TAKE 1 TABLET BY MOUTH 3 TIMES A DAY BY MOUTH FOR 14 DAYS. What changed:   how much to take  how to take this  when to take this    ondansetron 8 MG disintegrating tablet Commonly known as: ZOFRAN-ODT DISSOLVE 1 TABLET BY MOUTH EVERY 8 HOURS AS NEEDED FOR UP TO 7 DAYS FOR NAUSEA OR VOMITING.   pantoprazole 40 MG tablet Commonly known as: PROTONIX TAKE 1 TABLET BY MOUTH DAILY. What changed:   how much to take  when to take this   Vitamin D (Ergocalciferol) 1.25 MG (50000 UNIT) Caps capsule Commonly known as: DRISDOL TAKE 1 CAPSULE BY MOUTH EVERY 7 (SEVEN) DAYS. WALK IN LAB IN OFFICE 1-2 WEEKS AFTER COMPLETING PRESCRIPTION. What changed:   how much to take  how to take this  when to take this       Follow-up Information    Flinchum, Eula Fried, FNP. Schedule an appointment as soon as possible for a visit.   Specialty: Family Medicine Contact information: [address] [place] [address] [phone]              No Known Allergies  Consultations:  Cardiology  Procedures/Studies:  No results found.  Subjective: Patient was seen and examined today.  Mother at bedside.  Heart rate improved.  Continues to have intermittent nausea and had one vomitus since morning.  Per patient this is going on for more than a year.  She has seen a GI and underwent EGD and colonoscopy and they were nonconclusive.  She tried to have these episodes every month, per patient she is about to start her menstrual cycle any time.  May be some PMS element with cyclical episodes of nausea and vomiting.  She was advised to discuss with her gastroenterologist and gynecologist.  Discharge Exam: Vitals:   04/21/20 0843 04/21/20 1221  BP: (!) 151/77 115/77  Pulse: 65 71  Resp: 18 16  Temp: 98.2 F (36.8 C) 98.1 F (36.7 C)  SpO2: 98% 98%   Vitals:   04/21/20 0524 04/21/20 0612 04/21/20 0843 04/21/20 1221  BP: (!) 162/82 (!) 153/81 (!) 151/77 115/77  Pulse:   65 71  Resp:   18 16  Temp:   98.2 F (36.8 C) 98.1 F (36.7 C)  TempSrc:   Oral Oral  SpO2:   98% 98%  Weight:      Height:         General: Pt is alert, awake, not in acute distress Cardiovascular: RRR, S1/S2 +, no rubs, no gallops Respiratory: CTA bilaterally, no wheezing, no rhonchi Abdominal: Soft, NT, ND, bowel sounds + Extremities: no edema, no cyanosis   The results of significant diagnostics from this hospitalization (including imaging, microbiology, ancillary and laboratory) are listed below for reference.    Microbiology: Recent Results (from the past 240 hour(s))  Resp Panel by RT-PCR (Flu A&B, Covid)     Status: None   Collection Time: 04/20/20 11:38 AM   Specimen: Nasopharyngeal(NP) swabs in vial transport medium  Result Value Ref Range Status   SARS Coronavirus 2 by RT PCR NEGATIVE NEGATIVE Final    Comment: (NOTE) SARS-CoV-2 target nucleic acids are NOT DETECTED.  The SARS-CoV-2 RNA  is generally detectable in upper respiratory specimens during the acute phase of infection. The lowest concentration of SARS-CoV-2 viral copies this assay can detect is 138 copies/mL. A negative result does not preclude SARS-Cov-2 infection and should not be used as the sole basis for treatment or other patient management decisions. A negative result may occur with  improper specimen collection/handling, submission of specimen other than nasopharyngeal swab, presence of viral mutation(s) within the areas targeted by this assay, and inadequate number of viral copies(<138 copies/mL). A negative result must be combined with clinical observations, patient history, and epidemiological information. The expected result is Negative.  Fact Sheet for Patients:  BloggerCourse.com  Fact Sheet for Healthcare Providers:  SeriousBroker.it  This test is no t yet approved or cleared by the [place] FDA and  has been authorized for detection and/or diagnosis of SARS-CoV-2 by FDA under an Emergency Use Authorization (EUA). This EUA will remain  in effect (meaning this  test can be used) for the duration of the COVID-19 declaration under Section 564(b)(1) of the Act, 21 U.S.C.section 360bbb-3(b)(1), unless the authorization is terminated  or revoked sooner.       Influenza A by PCR NEGATIVE NEGATIVE Final   Influenza B by PCR NEGATIVE NEGATIVE Final    Comment: (NOTE) The Xpert Xpress SARS-CoV-2/FLU/RSV plus assay is intended as an aid in the diagnosis of influenza from Nasopharyngeal swab specimens and should not be used as a sole basis for treatment. Nasal washings and aspirates are unacceptable for Xpert Xpress SARS-CoV-2/FLU/RSV testing.  Fact Sheet for Patients: BloggerCourse.com  Fact Sheet for Healthcare Providers: SeriousBroker.it  This test is not yet approved or cleared by the [place] FDA and has been authorized for detection and/or diagnosis of SARS-CoV-2 by FDA under an Emergency Use Authorization (EUA). This EUA will remain in effect (meaning this test can be used) for the duration of the COVID-19 declaration under Section 564(b)(1) of the Act, 21 U.S.C. section 360bbb-3(b)(1), unless the authorization is terminated or revoked.  Performed at [facility], [address] [place], [place] [address]      Labs: BNP (last 3 results) No results for input(s): BNP in the last 8760 hours. Basic Metabolic Panel: Recent Labs  Lab 04/20/20 0952 04/20/20 1311 04/21/20 0602  NA 139  --  138  K 3.9  --  3.0*  CL 106  --  101  CO2 23  --  25  GLUCOSE 152*  --  138*  BUN 17  --  12  CREATININE 0.67  --  0.63  CALCIUM 9.5  --  9.0  MG  --  1.5* 1.8   Liver Function Tests: Recent Labs  Lab 04/20/20 0952  AST 33  ALT 47*  ALKPHOS 83  BILITOT 0.7  PROT 7.7  ALBUMIN 4.3   Recent Labs  Lab 04/20/20 0952  LIPASE 38   No results for input(s): AMMONIA in the last 168 hours. CBC: Recent Labs  Lab 04/20/20 0952 04/21/20 0602  WBC 10.5 15.0*  HGB 14.6 15.0   HCT 42.5 41.9  MCV 90.2 88.2  PLT 284 278   Cardiac Enzymes: No results for input(s): CKTOTAL, CKMB, CKMBINDEX, TROPONINI in the last 168 hours. BNP: Invalid input(s): POCBNP CBG: No results for input(s): GLUCAP in the last 168 hours. D-Dimer No results for input(s): DDIMER in the last 72 hours. Hgb A1c No results for input(s): HGBA1C in the last 72 hours. Lipid Profile No results for input(s): CHOL, HDL, LDLCALC, TRIG, CHOLHDL,  LDLDIRECT in the last 72 hours. Thyroid function studies Recent Labs    04/20/20 1311  TSH 0.605   Anemia work up No results for input(s): VITAMINB12, FOLATE, FERRITIN, TIBC, IRON, RETICCTPCT in the last 72 hours. Urinalysis    Component Value Date/Time   COLORURINE STRAW (A) 04/20/2020 0953   APPEARANCEUR CLEAR (A) 04/20/2020 0953   LABSPEC 1.013 04/20/2020 0953   PHURINE 6.0 04/20/2020 0953   GLUCOSEU 50 (A) 04/20/2020 0953   HGBUR MODERATE (A) 04/20/2020 0953   BILIRUBINUR NEGATIVE 04/20/2020 0953   KETONESUR 20 (A) 04/20/2020 0953   PROTEINUR NEGATIVE 04/20/2020 0953   UROBILINOGEN 0.2 10/16/2015 0840   NITRITE NEGATIVE 04/20/2020 0953   LEUKOCYTESUR NEGATIVE 04/20/2020 0953   Sepsis Labs Invalid input(s): PROCALCITONIN,  WBC,  LACTICIDVEN Microbiology Recent Results (from the past 240 hour(s))  Resp Panel by RT-PCR (Flu A&B, Covid)     Status: None   Collection Time: 04/20/20 11:38 AM   Specimen: Nasopharyngeal(NP) swabs in vial transport medium  Result Value Ref Range Status   SARS Coronavirus 2 by RT PCR NEGATIVE NEGATIVE Final    Comment: (NOTE) SARS-CoV-2 target nucleic acids are NOT DETECTED.  The SARS-CoV-2 RNA is generally detectable in upper respiratory specimens during the acute phase of infection. The lowest concentration of SARS-CoV-2 viral copies this assay can detect is 138 copies/mL. A negative result does not preclude SARS-Cov-2 infection and should not be used as the sole basis for treatment or other patient  management decisions. A negative result may occur with  improper specimen collection/handling, submission of specimen other than nasopharyngeal swab, presence of viral mutation(s) within the areas targeted by this assay, and inadequate number of viral copies(<138 copies/mL). A negative result must be combined with clinical observations, patient history, and epidemiological information. The expected result is Negative.  Fact Sheet for Patients:  BloggerCourse.comhttps://www.fda.gov/media/152166/download  Fact Sheet for Healthcare Providers:  SeriousBroker.ithttps://www.fda.gov/media/152162/download  This test is no t yet approved or cleared by the [place] FDA and  has been authorized for detection and/or diagnosis of SARS-CoV-2 by FDA under an Emergency Use Authorization (EUA). This EUA will remain  in effect (meaning this test can be used) for the duration of the COVID-19 declaration under Section 564(b)(1) of the Act, 21 U.S.C.section 360bbb-3(b)(1), unless the authorization is terminated  or revoked sooner.       Influenza A by PCR NEGATIVE NEGATIVE Final   Influenza B by PCR NEGATIVE NEGATIVE Final    Comment: (NOTE) The Xpert Xpress SARS-CoV-2/FLU/RSV plus assay is intended as an aid in the diagnosis of influenza from Nasopharyngeal swab specimens and should not be used as a sole basis for treatment. Nasal washings and aspirates are unacceptable for Xpert Xpress SARS-CoV-2/FLU/RSV testing.  Fact Sheet for Patients: BloggerCourse.comhttps://www.fda.gov/media/152166/download  Fact Sheet for Healthcare Providers: SeriousBroker.ithttps://www.fda.gov/media/152162/download  This test is not yet approved or cleared by the [place] FDA and has been authorized for detection and/or diagnosis of SARS-CoV-2 by FDA under an Emergency Use Authorization (EUA). This EUA will remain in effect (meaning this test can be used) for the duration of the COVID-19 declaration under Section 564(b)(1) of the Act, 21 U.S.C. section 360bbb-3(b)(1),  unless the authorization is terminated or revoked.  Performed at [facility], [address] [place], [place] [id number]     Time coordinating discharge: Over 30 minutes  SIGNED:  Arnetha CourserSumayya Tniya Bowditch, MD  [facility] 04/21/2020, 2:08 PM  If 7PM-7AM, please contact night-coverage [email or web address]  This record has been created using [employer] voice  recognition software. Errors have been sought and corrected,but may not always be located. Such creation errors do not reflect on the standard of care.

## 2020-04-21 NOTE — Progress Notes (Signed)
[facility] Encounter Note  Patient: Bonnie Shepard / Admit Date: 04/20/2020 / Date of Encounter: 04/21/2020, 8:27 AM   Subjective: Patient continues to have nausea and vomiting episodes throughout the evening and has not been improved with current therapies.  The patient does have some mild borderline hypertension for which she is continuing to use amlodipine.  The patient continues to have sinus bradycardia off-and-on with no evidence of advanced heart block and or symptoms at this time.  Review of Systems: Positive for: Nausea vomiting Negative for: Vision change, hearing change, syncope, dizziness, positive for nausea, vomiting, negative for diarrhea, bloody stool, stomach pain, cough, congestion, diaphoresis, urinary frequency, urinary pain,skin lesions, skin rashes Others previously listed  Objective: Telemetry: Sinus bradycardia Physical Exam: Blood pressure (!) 153/81, pulse (!) 51, temperature 98 F (36.7 C), temperature source Oral, resp. rate 17, height 5\' 3"  (1.6 m), weight 101.3 kg, last menstrual period 03/17/2020, SpO2 98 %. Body mass index is 39.56 kg/m. General: Well developed, well nourished, in no acute distress. Head: Normocephalic, atraumatic, sclera non-icteric, no xanthomas, nares are without discharge. Neck: No apparent masses Lungs: Normal respirations with no wheezes, no rhonchi, no rales , no crackles   Heart: Regular rate and rhythm, normal S1 S2, no murmur, no rub, no gallop, PMI is normal size and placement, carotid upstroke normal without bruit, jugular venous pressure normal Abdomen: Soft, non-tender, non-distended with normoactive bowel sounds. No hepatosplenomegaly. Abdominal aorta is normal size without bruit Extremities: No edema, no clubbing, no cyanosis, no ulcers,  Peripheral: 2+ radial, 2+ femoral, 2+ dorsal pedal pulses Neuro: Alert and oriented. Moves all extremities spontaneously. Psych:  Responds to questions appropriately with  a normal affect.   Intake/Output Summary (Last 24 hours) at 04/21/2020 0827 Last data filed at 04/21/2020 0500 Gross per 24 hour  Intake 666.29 ml  Output 700 ml  Net -33.71 ml    Inpatient Medications:  . amLODipine  10 mg Oral Daily  . cetirizine  5 mg Oral QPM  . enoxaparin (LOVENOX) injection  0.5 mg/kg Subcutaneous Q24H  . escitalopram  5 mg Oral Daily  . pantoprazole (PROTONIX) IV  40 mg Intravenous Q24H   Infusions:  . sodium chloride 75 mL/hr at 04/21/20 0300  . potassium chloride      Labs: Recent Labs    04/20/20 0952 04/20/20 1311 04/21/20 0602  NA 139  --  138  K 3.9  --  3.0*  CL 106  --  101  CO2 23  --  25  GLUCOSE 152*  --  138*  BUN 17  --  12  CREATININE 0.67  --  0.63  CALCIUM 9.5  --  9.0  MG  --  1.5* 1.8   Recent Labs    04/20/20 0952  AST 33  ALT 47*  ALKPHOS 83  BILITOT 0.7  PROT 7.7  ALBUMIN 4.3   Recent Labs    04/20/20 0952 04/21/20 0602  WBC 10.5 15.0*  HGB 14.6 15.0  HCT 42.5 41.9  MCV 90.2 88.2  PLT 284 278   No results for input(s): CKTOTAL, CKMB, TROPONINI in the last 72 hours. Invalid input(s): POCBNP No results for input(s): HGBA1C in the last 72 hours.   Weights: Filed Weights   04/20/20 0940 04/20/20 2006 04/21/20 0322  Weight: 106.6 kg 104.3 kg 101.3 kg     Radiology/Studies:  No results found.   Assessment and Recommendation  35 y.o. female with hypertension and significant nausea and vomiting of  unknown etiology with asymptomatic bradycardia and no evidence of advanced heart block 1.  Continue supportive care for nausea vomiting and other etiologies of current illness 2.  Continue telemetry for now and throughout today but if no further evidence of symptomatic bradycardia and/or heart block would discontinue telemetry changed to floor status 3.  No further intervention of asymptomatic bradycardia 4.  Call if further questions  Signed, Arnoldo Hooker M.D. FACC

## 2020-04-21 NOTE — Progress Notes (Signed)
Initial Nutrition Assessment  DOCUMENTATION CODES:   Obesity unspecified  INTERVENTION:   -Boost Breeze po TID, each supplement provides 250 kcal and 9 grams of protein -MVI with minerals daily  NUTRITION DIAGNOSIS:   Inadequate oral intake related to nausea,vomiting as evidenced by per patient/family report.  GOAL:   Patient will meet greater than or equal to 90% of their needs  MONITOR:   PO intake,Supplement acceptance,Diet advancement,Labs,Weight trends,Skin,I & O's  REASON FOR ASSESSMENT:   Malnutrition Screening Tool    ASSESSMENT:   Bonnie Shepard is a 35 y.o. female with medical history significant for morbid obesity, GERD, hypertension and asthma who presents to the emergency room for evaluation of dizziness and lightheadedness.  Pt admitted with bradycardia.   Reviewed I/O's: -34 ml x 24 hours  UOP: 700 ml x 24 hours  Pt unavailable at time of visit. Attempted to speak with pt via call to hospital room phone, however, unable to reach.   Per MD notes, pt with nausea and vomiting last night. Pt advanced to a full liquid diet today; no meal completion data available to assess at this time.   Reviewed wt hx; pt has experienced a 5.8% wt loss over the past month, which is significant for time frame. Suspect some wt loss may be related to dehydration.   Medications reviewed and 0.45% sodium chloride infusion @ 75 ml/hr.   Labs reviewed: K: 3.0 (on IV supplementation).   Diet Order:   Diet Order            Diet full liquid Room service appropriate? Yes; Fluid consistency: Thin  Diet effective now                 EDUCATION NEEDS:   No education needs have been identified at this time  Skin:  Skin Assessment: Skin Integrity Issues: Skin Integrity Issues:: Incisions Incisions: closed lip  Last BM:  04/20/20  Height:   Ht Readings from Last 1 Encounters:  04/20/20 5\' 3"  (1.6 m)    Weight:   Wt Readings from Last 1 Encounters:  04/21/20 101.3 kg     Ideal Body Weight:  52.3 kg  BMI:  Body mass index is 39.56 kg/m.  Estimated Nutritional Needs:   Kcal:  1900-2100  Protein:  105-120 grams  Fluid:  > 1.9 L    04/23/20, RD, LDN, CDCES Registered Dietitian II Certified Diabetes Care and Education Specialist Please refer to [facility] for RD and/or RD on-call/weekend/after hours pager

## 2020-04-22 ENCOUNTER — Emergency Department (HOSPITAL_COMMUNITY)
Admission: EM | Admit: 2020-04-22 | Discharge: 2020-04-22 | Disposition: A | Discharge status: Home / Self Care | Payer: No Typology Code available for payment source | Attending: Emergency Medicine | Admitting: Emergency Medicine

## 2020-04-22 ENCOUNTER — Other Ambulatory Visit: Payer: Self-pay

## 2020-04-22 ENCOUNTER — Encounter: Payer: Self-pay | Admitting: Adult Health

## 2020-04-22 DIAGNOSIS — I10 Essential (primary) hypertension: Secondary | ICD-10-CM | POA: Diagnosis not present

## 2020-04-22 DIAGNOSIS — Z7952 Long term (current) use of systemic steroids: Secondary | ICD-10-CM | POA: Insufficient documentation

## 2020-04-22 DIAGNOSIS — Z79899 Other long term (current) drug therapy: Secondary | ICD-10-CM | POA: Diagnosis not present

## 2020-04-22 DIAGNOSIS — R112 Nausea with vomiting, unspecified: Secondary | ICD-10-CM | POA: Diagnosis not present

## 2020-04-22 DIAGNOSIS — F17210 Nicotine dependence, cigarettes, uncomplicated: Secondary | ICD-10-CM | POA: Insufficient documentation

## 2020-04-22 DIAGNOSIS — R1013 Epigastric pain: Secondary | ICD-10-CM | POA: Insufficient documentation

## 2020-04-22 DIAGNOSIS — K219 Gastro-esophageal reflux disease without esophagitis: Secondary | ICD-10-CM | POA: Insufficient documentation

## 2020-04-22 DIAGNOSIS — J45909 Unspecified asthma, uncomplicated: Secondary | ICD-10-CM | POA: Insufficient documentation

## 2020-04-22 DIAGNOSIS — R197 Diarrhea, unspecified: Secondary | ICD-10-CM | POA: Diagnosis not present

## 2020-04-22 LAB — CBC WITH DIFFERENTIAL/PLATELET
Abs Immature Granulocytes: 0.06 10*3/uL (ref 0.00–0.07)
Basophils Absolute: 0 10*3/uL (ref 0.0–0.1)
Basophils Relative: 0 %
Eosinophils Absolute: 0 10*3/uL (ref 0.0–0.5)
Eosinophils Relative: 0 %
HCT: 46.1 % — ABNORMAL HIGH (ref 36.0–46.0)
Hemoglobin: 15.9 g/dL — ABNORMAL HIGH (ref 12.0–15.0)
Immature Granulocytes: 1 %
Lymphocytes Relative: 18 %
Lymphs Abs: 2.1 10*3/uL (ref 0.7–4.0)
MCH: 31.1 pg (ref 26.0–34.0)
MCHC: 34.5 g/dL (ref 30.0–36.0)
MCV: 90 fL (ref 80.0–100.0)
Monocytes Absolute: 0.5 10*3/uL (ref 0.1–1.0)
Monocytes Relative: 4 %
Neutro Abs: 9.3 10*3/uL — ABNORMAL HIGH (ref 1.7–7.7)
Neutrophils Relative %: 77 %
Platelets: 322 10*3/uL (ref 150–400)
RBC: 5.12 MIL/uL — ABNORMAL HIGH (ref 3.87–5.11)
RDW: 12.1 % (ref 11.5–15.5)
WBC: 12 10*3/uL — ABNORMAL HIGH (ref 4.0–10.5)
nRBC: 0 % (ref 0.0–0.2)

## 2020-04-22 LAB — URINALYSIS, ROUTINE W REFLEX MICROSCOPIC
Bilirubin Urine: NEGATIVE
Glucose, UA: NEGATIVE mg/dL
Ketones, ur: 80 mg/dL — AB
Nitrite: NEGATIVE
Protein, ur: 100 mg/dL — AB
RBC / HPF: >50 RBC/hpf — ABNORMAL HIGH (ref 0–5)
Specific Gravity, Urine: 1.025 (ref 1.005–1.030)
pH: 7 (ref 5.0–8.0)

## 2020-04-22 LAB — COMPREHENSIVE METABOLIC PANEL
ALT: 50 U/L — ABNORMAL HIGH (ref 0–44)
AST: 36 U/L (ref 15–41)
Albumin: 4.3 g/dL (ref 3.5–5.0)
Alkaline Phosphatase: 77 U/L (ref 38–126)
Anion gap: 11 (ref 5–15)
BUN: 14 mg/dL (ref 6–20)
CO2: 26 mmol/L (ref 22–32)
Calcium: 9.3 mg/dL (ref 8.9–10.3)
Chloride: 97 mmol/L — ABNORMAL LOW (ref 98–111)
Creatinine, Ser: 0.75 mg/dL (ref 0.44–1.00)
GFR, Estimated: >60 mL/min (ref >60)
Glucose, Bld: 114 mg/dL — ABNORMAL HIGH (ref 70–99)
Potassium: 3.4 mmol/L — ABNORMAL LOW (ref 3.5–5.1)
Sodium: 134 mmol/L — ABNORMAL LOW (ref 135–145)
Total Bilirubin: 0.8 mg/dL (ref 0.3–1.2)
Total Protein: 8 g/dL (ref 6.5–8.1)

## 2020-04-22 LAB — ECHOCARDIOGRAM COMPLETE
AR max vel: 2.44 cm2
AV Peak grad: 7.2 mmHg
Ao pk vel: 1.34 m/s
Area-P 1/2: 3.48 cm2
Height: 63 in
S' Lateral: 3.01 cm
Weight: 3573.22 oz

## 2020-04-22 LAB — LIPASE, BLOOD: Lipase: 28 U/L (ref 11–51)

## 2020-04-22 MED ORDER — PROCHLORPERAZINE EDISYLATE 10 MG/2ML IJ SOLN
10.0000 mg | Freq: Once | INTRAMUSCULAR | Status: AC
  Administered 2020-04-22: 10 mg via INTRAVENOUS
  Filled 2020-04-22: qty 2

## 2020-04-22 MED ORDER — SODIUM CHLORIDE 0.9 % IV BOLUS
1000.0000 mL | Freq: Once | INTRAVENOUS | Status: AC
  Administered 2020-04-22: 1000 mL via INTRAVENOUS

## 2020-04-22 NOTE — ED Provider Notes (Signed)
[facility] Emergency Department Provider Note   ____________________________________________   Event Date/Time   First MD Initiated Contact with Patient 04/20/20 0940     (approximate)  I have reviewed the triage vital signs and the nursing notes.   HISTORY  Chief Complaint Emesis    HPI Bonnie Shepard is a 35 y.o. female with a past medical history of GERD who presents for nausea/vomiting/abdominal pain.  Patient states that this is been chronic for the past year but the Zofran that she received from her PCP was not effective.  Patient states midepigastric abdominal pain followed by nausea/vomiting with no apparent exacerbating or relieving factors.  Patient currently denies any vision changes, tinnitus, difficulty speaking, facial droop, sore throat, chest pain, shortness of breath, diarrhea, dysuria, or weakness/numbness/paresthesias in any extremity         Past Medical History:  Diagnosis Date  . Acid reflux   . Asthma   . Family history of adverse reaction to anesthesia    Mother - PONV  . Infection    freq UTI  . Vaginal Pap smear, abnormal    f/u wnl  . Wears contact lenses     Patient Active Problem List   Diagnosis Date Noted  . Bradycardia with 41-50 beats per minute 04/20/2020  . No-show for appointment 04/18/2020  . Refractory nausea and vomiting   . Diverticulitis 03/28/2020  . Suspected COVID-19 virus infection 03/28/2020  . Non-recurrent acute serous otitis media of right ear 03/28/2020  . Hypertension 03/28/2020  . Allergic rhinitis 03/28/2020  . Bacteria in urine 02/07/2020  . History of dehydration 02/07/2020  . Intractable vomiting with nausea 01/17/2020  . History of foot fracture- left  10/10/2019  . Fatigue 08/09/2019  . Lymphadenopathy- posterior left side  08/09/2019  . GERD (gastroesophageal reflux disease) 07/21/2019  . History of nausea and vomiting 07/21/2019  . Epigastric burning sensation 07/21/2019  .  Hematochezia   . Hemorrhoids, external, thrombosed 03/23/2019  . Rectal or anal pain 03/23/2019  . History of rectal bleeding 03/23/2019  . Trichomonas vaginitis 02/28/2019  . Vaginal high risk HPV DNA test positive 02/28/2019  . Anxiety with depression 02/23/2019  . Body mass index (BMI) of 40.1-44.9 in adult (HCC) 02/23/2019  . Left foot pain 02/23/2019  . Cervical cancer screening 02/23/2019  . Morbid obesity (HCC) 02/21/2019  . Itching in the vaginal area 02/21/2019  . History of pregnancy loss in prior pregnancy, currently pregnant in second trimester 07/10/2016  . Incontinence overflow, stress female 12/03/2015  . Nausea 01/25/2008  . Diarrhea of presumed infectious origin 01/25/2008    Past Surgical History:  Procedure Laterality Date  . COLONOSCOPY WITH PROPOFOL N/A 04/03/2019   Procedure: COLONOSCOPY WITH PROPOFOL;  Surgeon: Midge MiniumWohl, Darren, MD;  [facility];  Service: Endoscopy;  Laterality: N/A;  Priority 3  . DILATION AND EVACUATION N/A 09/07/2013   Procedure: DILATATION AND EVACUATION;  Surgeon: Freddrick MarchKendra H. Tenny Crawoss, MD;  Location: [facility];  Service: Gynecology;  Laterality: N/A;  . ESOPHAGOGASTRODUODENOSCOPY (EGD) WITH PROPOFOL N/A 04/04/2020   Procedure: ESOPHAGOGASTRODUODENOSCOPY (EGD) WITH BIOPSY;  Surgeon: Midge MiniumWohl, Darren, MD;  [facility];  Service: Endoscopy;  Laterality: N/A;  . TONSILLECTOMY      Prior to Admission medications   Medication Sig Start Date End Date Taking? Authorizing Provider  albuterol (VENTOLIN HFA) 108 (90 Base) MCG/ACT inhaler Inhale 2 puffs into the lungs every 4 (four) hours as needed. 03/28/20  Yes [provider]  Azelastine  HCl 0.15 % SOLN PLACE 2 SPRAYS INTO THE NOSE DAILY. 02/07/20 02/06/21 Yes Flinchum, Eula Fried, FNP  escitalopram (LEXAPRO) 5 MG tablet TAKE 1 TABLET BY MOUTH DAILY. 03/28/20 03/28/21 Yes Flinchum, Eula Fried, FNP  fluticasone (FLONASE) 50 MCG/ACT nasal spray PLACE 2 SPRAYS INTO BOTH NOSTRILS  DAILY. 03/28/20 03/28/21 Yes Flinchum, Eula Fried, FNP  levocetirizine (XYZAL) 5 MG tablet TAKE 1 TABLET BY MOUTH EVERY EVENING. Patient taking differently: Take 5 mg by mouth every evening. 03/28/20 03/28/21 Yes Flinchum, Eula Fried, FNP  ondansetron (ZOFRAN-ODT) 8 MG disintegrating tablet DISSOLVE 1 TABLET BY MOUTH EVERY 8 HOURS AS NEEDED FOR UP TO 7 DAYS FOR NAUSEA OR VOMITING. 01/18/20 01/17/21 Yes Charise Killian, MD  pantoprazole (PROTONIX) 40 MG tablet TAKE 1 TABLET BY MOUTH DAILY. Patient taking differently: Take 40 mg by mouth 2 (two) times daily. 03/28/20 03/28/21 Yes Flinchum, Eula Fried, FNP  Vitamin D, Ergocalciferol, (DRISDOL) 1.25 MG (50000 UNIT) CAPS capsule TAKE 1 CAPSULE BY MOUTH EVERY 7 (SEVEN) DAYS. WALK IN LAB IN OFFICE 1-2 WEEKS AFTER COMPLETING PRESCRIPTION. Patient taking differently: Take 50,000 Units by mouth every 7 (seven) days. 03/06/20 03/06/21 Yes Flinchum, Eula Fried, FNP  hydrochlorothiazide (HYDRODIURIL) 25 MG tablet TAKE 0.5 TABLETS BY MOUTH DAILY. Patient not taking: No sig reported 03/28/20 03/28/21  Flinchum, Eula Fried, FNP  methocarbamol (ROBAXIN) 500 MG tablet TAKE 1 TABLET BY MOUTH 3 TIMES A DAY BY MOUTH FOR 14 DAYS. Patient taking differently: Take 500 mg by mouth daily. 12/15/19 12/14/20  Ivar Drape, PA-C    Allergies Patient has no known allergies.  Family History  Problem Relation Age of Onset  . Hypertension Other   . Cancer Other   . Hypertension Mother   . Hypertension Father   . Hypertension Maternal Grandmother   . Heart disease Maternal Grandmother   . Hearing loss Neg Hx     Social History Social History   Tobacco Use  . Smoking status: Current Every Day Smoker    Packs/day: 0.25    Years: 16.00    Pack years: 4.00    Types: Cigarettes  . Smokeless tobacco: Never Used  . Tobacco comment: since age 17  Vaping Use  . Vaping Use: Never used  Substance Use Topics  . Alcohol use: No    Comment: occasionally  . Drug use: No     Review of Systems Constitutional: No fever/chills Eyes: No visual changes. ENT: No sore throat. Cardiovascular: Denies chest pain. Respiratory: Denies shortness of breath. Gastrointestinal: Endorses abdominal pain, nausea, and vomiting.  No diarrhea. Genitourinary: Negative for dysuria. Musculoskeletal: Negative for acute arthralgias Skin: Negative for rash. Neurological: Negative for headaches, weakness/numbness/paresthesias in any extremity Psychiatric: Negative for suicidal ideation/homicidal ideation   ____________________________________________   PHYSICAL EXAM:  VITAL SIGNS: ED Triage Vitals  Enc Vitals Group     BP 04/20/20 0943 (!) 190/104     Pulse Rate 04/20/20 0943 (!) 54     Resp 04/20/20 0943 20     Temp 04/20/20 0943 97.7 F (36.5 C)     Temp Source 04/20/20 0943 Oral     SpO2 04/20/20 0943 100 %     Weight 04/20/20 0940 235 lb (106.6 kg)     Height 04/20/20 0940 5\' 3"  (1.6 m)     Head Circumference --      Peak Flow --      Pain Score 04/20/20 0940 0     Pain Loc --      Pain Edu? --  Excl. in GC? --    Constitutional: Alert and oriented. Well appearing and in no acute distress. Eyes: Conjunctivae are normal. PERRL. Head: Atraumatic. Nose: No congestion/rhinnorhea. Mouth/Throat: Mucous membranes are moist. Neck: No stridor Cardiovascular: Sinus bradycardia.  Grossly normal heart sounds.  Good peripheral circulation. Respiratory: Normal respiratory effort.  No retractions. Gastrointestinal: Soft and nontender. No distention. Musculoskeletal: No obvious deformities Neurologic:  Normal speech and language. No gross focal neurologic deficits are appreciated. Skin:  Skin is warm and dry. No rash noted. Psychiatric: Mood and affect are normal. Speech and behavior are normal.  ____________________________________________   LABS (all labs ordered are listed, but only abnormal results are displayed)  Labs Reviewed  COMPREHENSIVE METABOLIC  PANEL - Abnormal; Notable for the following components:      Result Value   Glucose, Bld 152 (*)    ALT 47 (*)    All other components within normal limits  URINALYSIS, COMPLETE (UACMP) WITH MICROSCOPIC - Abnormal; Notable for the following components:   Color, Urine STRAW (*)    APPearance CLEAR (*)    Glucose, UA 50 (*)    Hgb urine dipstick MODERATE (*)    Ketones, ur 20 (*)    All other components within normal limits  MAGNESIUM - Abnormal; Notable for the following components:   Magnesium 1.5 (*)    All other components within normal limits  BASIC METABOLIC PANEL - Abnormal; Notable for the following components:   Potassium 3.0 (*)    Glucose, Bld 138 (*)    All other components within normal limits  CBC - Abnormal; Notable for the following components:   WBC 15.0 (*)    All other components within normal limits  URINE DRUG SCREEN, QUALITATIVE ([facility] ONLY) - Abnormal; Notable for the following components:   Cannabinoid 50 Ng, Ur [place] POSITIVE (*)    All other components within normal limits  RESP PANEL BY RT-PCR (FLU A&B, COVID) ARPGX2  LIPASE, BLOOD  CBC  TSH  HIV ANTIBODY (ROUTINE TESTING W REFLEX)  MAGNESIUM  POC URINE PREG, ED  TROPONIN I (HIGH SENSITIVITY)  TROPONIN I (HIGH SENSITIVITY)    PROCEDURES  Procedure(s) performed (including Critical Care):  .1-3 Lead EKG Interpretation Performed by: Merwyn Katos, MD Authorized by: Merwyn Katos, MD     Interpretation: abnormal     ECG rate:  44   ECG rate assessment: bradycardic     Rhythm: sinus bradycardia     Ectopy: none     Conduction: normal       ____________________________________________   INITIAL IMPRESSION / ASSESSMENT AND PLAN / ED COURSE  As part of my medical decision making, I reviewed the following data within the electronic MEDICAL RECORD NUMBER Nursing notes reviewed and incorporated, Labs reviewed, EKG interpreted, Old chart reviewed, Radiograph reviewed and Notes from prior ED visits  reviewed and incorporated     Patient presents for nausea/vomiting/abdominal pain that has been chronic however incidentally was found to be significantly bradycardic. The patient is suffering from bradycardia, but the immediate cause is not apparent.   Patient WITHOUT concerning signs of instability on exam such as altered mental status, hypotension, evidence of cardiac end organ dysfunction, or acute heart failure.  Potential emergent causes considered include, but are not limited to:  Myocardial Infarction (RCA lesion) Infection Hypothyroidism Hyperkalemia Hypoglycemia Dehydration Intoxication (beta blockade, calcium channel blockade, clonidine, digoxin, opiates, alcohol or other).  Findings: Despite the evaluation including history, exam, and testing, the cause of the bradycardia remains  unclear. However the history, exam, and tests do not raise concern for the above emergent diagnoses.  Disposition: Admit to medicine       ____________________________________________   FINAL CLINICAL IMPRESSION(S) / ED DIAGNOSES  Final diagnoses:  Symptomatic bradycardia  Intractable vomiting with nausea, unspecified vomiting type     ED Discharge Orders         Ordered    Increase activity slowly        04/21/20 1406    Diet - low sodium heart healthy        04/21/20 1406    Discharge instructions       Comments: It was pleasure taking care of you. As we discussed please follow-up with your gastroenterologist and gynecologist to see if there is any cyclical element of your nausea and vomiting. Continue taking as needed Zofran or Phenergan as you have them at home. Keep yourself well-hydrated   04/21/20 1406           Note:  This document was prepared using Dragon voice recognition software and may include unintentional dictation errors.   Merwyn Katos, MD 04/22/20 [phone]

## 2020-04-22 NOTE — ED Triage Notes (Signed)
Emergency Medicine Provider Triage Evaluation Note  Bonnie Shepard , a 35 y.o. female  was evaluated in triage.  Pt complains of abd pain, nausea vomiting  Review of Systems  Positive: Nv, abd pain Negative: diarrhea  Physical Exam  BP (!) 189/101 (BP Location: Left Arm) Comment: Pt didn't state she had a history of hypertension, but it had been jumping up and down all weekend from being sick.  Pulse (!) 59   Temp 98 F (36.7 C) (Oral)   Resp 17   SpO2 98%  Gen:   Awake, no distress   HEENT:  Atraumatic  Resp:  Normal effort  Cardiac:  Normal rate  Abd:   Nondistended, nontender  MSK:   Moves extremities without difficulty  Neuro:  Speech clear   Medical Decision Making  Medically screening exam initiated at 5:31 PM.  Appropriate orders placed.  Bonnie Shepard was informed that the remainder of the evaluation will be completed by another provider, this initial triage assessment does not replace that evaluation, and the importance of remaining in the ED until their evaluation is complete.  Clinical Impression   35 y/o F presenting for eval of abd pain, nvd  MSE was initiated and I personally evaluated the patient and placed orders (if any) at  5:31 PM on April 22, 2020.  The patient appears stable so that the remainder of the MSE may be completed by another provider.    Karrie Meres, [place] 04/22/20 1731

## 2020-04-22 NOTE — ED Provider Notes (Signed)
[facility] EMERGENCY DEPARTMENT Provider Note   CSN: [id number] Arrival date & time: 04/22/20  1607     History No chief complaint on file.   Alleta Avery is a 35 y.o. female with a history of pretension, GERD, actable nausea and vomiting, morbid obesity.  Patient presents today with a chief complaint of nausea, vomiting, and epigastric pain.  She reports that she has had the symptoms over the last year however they have worsened in the last few weeks.  Patient endorses vomiting 10 times in the last 24 hours.  Describes her emesis as yellow.  Denies any bloody emesis or coffee-ground emesis.  Patient reports minimal improvement with at home Zofran medication.  Patient reports epigastric abdominal pain associated with her nausea and vomiting.  Patient reports pain 7/10 on the pain scale.  Patient denies any radiation of her pain.  Patient reports pain is worse with vomiting.  Patient denies any alleviating factors.  She reports one episode of diarrhea earlier this morning.  Patient endorses chills.  Patient denies any fevers, rhinorrhea, nasal congestion, sore throat, chest pain, shortness of breath, GU complaints, lightheadedness, dizziness, syncopal episodes, headaches, neck pain, neck stiffness, back pain, flank pain.  Patient reports that she was prescribed medication for hypertension however was told not to start it by her primary care provider.  Per chart review patient was seen at [facility] 04/20/20 as well as yesterday for her nausea and vomiting.  Patient had EGD with biopsy performed 3/31.  CT scan of abdomen pelvis performed on 01/17/2020 showed no acute intra-abdominal abnormality, scattered sigmoid diverticula.    HPI     Past Medical History:  Diagnosis Date  . Acid reflux   . Asthma   . Family history of adverse reaction to anesthesia    Mother - PONV  . Infection    freq UTI  . Vaginal Pap smear, abnormal    f/u wnl  . Wears contact  lenses     Patient Active Problem List   Diagnosis Date Noted  . Bradycardia with 41-50 beats per minute 04/20/2020  . No-show for appointment 04/18/2020  . Refractory nausea and vomiting   . Diverticulitis 03/28/2020  . Suspected COVID-19 virus infection 03/28/2020  . Non-recurrent acute serous otitis media of right ear 03/28/2020  . Hypertension 03/28/2020  . Allergic rhinitis 03/28/2020  . Bacteria in urine 02/07/2020  . History of dehydration 02/07/2020  . Intractable vomiting with nausea 01/17/2020  . History of foot fracture- left  10/10/2019  . Fatigue 08/09/2019  . Lymphadenopathy- posterior left side  08/09/2019  . GERD (gastroesophageal reflux disease) 07/21/2019  . History of nausea and vomiting 07/21/2019  . Epigastric burning sensation 07/21/2019  . Hematochezia   . Hemorrhoids, external, thrombosed 03/23/2019  . Rectal or anal pain 03/23/2019  . History of rectal bleeding 03/23/2019  . Trichomonas vaginitis 02/28/2019  . Vaginal high risk HPV DNA test positive 02/28/2019  . Anxiety with depression 02/23/2019  . Body mass index (BMI) of 40.1-44.9 in adult (HCC) 02/23/2019  . Left foot pain 02/23/2019  . Cervical cancer screening 02/23/2019  . Morbid obesity (HCC) 02/21/2019  . Itching in the vaginal area 02/21/2019  . History of pregnancy loss in prior pregnancy, currently pregnant in second trimester 07/10/2016  . Incontinence overflow, stress female 12/03/2015  . Nausea 01/25/2008  . Diarrhea of presumed infectious origin 01/25/2008    Past Surgical History:  Procedure Laterality Date  . COLONOSCOPY WITH PROPOFOL N/A 04/03/2019  Procedure: COLONOSCOPY WITH PROPOFOL;  Surgeon: Midge Minium, MD;  Location: [facility];  Service: Endoscopy;  Laterality: N/A;  Priority 3  . DILATION AND EVACUATION N/A 09/07/2013   Procedure: DILATATION AND EVACUATION;  Surgeon: Freddrick March. Tenny Craw, MD;  Location: [facility];  Service: Gynecology;  Laterality: N/A;  .  ESOPHAGOGASTRODUODENOSCOPY (EGD) WITH PROPOFOL N/A 04/04/2020   Procedure: ESOPHAGOGASTRODUODENOSCOPY (EGD) WITH BIOPSY;  Surgeon: Midge Minium, MD;  Location: [facility];  Service: Endoscopy;  Laterality: N/A;  . TONSILLECTOMY       OB History    Gravida  3   Para  1   Term  1   Preterm      AB  1   Living  1     SAB      IAB      Ectopic      Multiple  0   Live Births  1           Family History  Problem Relation Age of Onset  . Hypertension Other   . Cancer Other   . Hypertension Mother   . Hypertension Father   . Hypertension Maternal Grandmother   . Heart disease Maternal Grandmother   . Hearing loss Neg Hx     Social History   Tobacco Use  . Smoking status: Current Every Day Smoker    Packs/day: 0.25    Years: 16.00    Pack years: 4.00    Types: Cigarettes  . Smokeless tobacco: Never Used  . Tobacco comment: since age 63  Vaping Use  . Vaping Use: Never used  Substance Use Topics  . Alcohol use: No    Comment: occasionally  . Drug use: No    Home Medications Prior to Admission medications   Medication Sig Start Date End Date Taking? Authorizing Provider  albuterol (VENTOLIN HFA) 108 (90 Base) MCG/ACT inhaler Inhale 2 puffs into the lungs every 4 (four) hours as needed. 03/28/20   [provider]  Azelastine HCl 0.15 % SOLN PLACE 2 SPRAYS INTO THE NOSE DAILY. 02/07/20 02/06/21  Flinchum, Eula Fried, FNP  escitalopram (LEXAPRO) 5 MG tablet TAKE 1 TABLET BY MOUTH DAILY. 03/28/20 03/28/21  Flinchum, Eula Fried, FNP  fluticasone (FLONASE) 50 MCG/ACT nasal spray PLACE 2 SPRAYS INTO BOTH NOSTRILS DAILY. 03/28/20 03/28/21  Flinchum, Eula Fried, FNP  hydrochlorothiazide (HYDRODIURIL) 25 MG tablet TAKE 0.5 TABLETS BY MOUTH DAILY. Patient not taking: No sig reported 03/28/20 03/28/21  Flinchum, Eula Fried, FNP  levocetirizine (XYZAL) 5 MG tablet TAKE 1 TABLET BY MOUTH EVERY EVENING. Patient taking differently: Take 5 mg by mouth every evening.  03/28/20 03/28/21  Flinchum, Eula Fried, FNP  methocarbamol (ROBAXIN) 500 MG tablet TAKE 1 TABLET BY MOUTH 3 TIMES A DAY BY MOUTH FOR 14 DAYS. Patient taking differently: Take 500 mg by mouth daily. 12/15/19 12/14/20  Ivar Drape, PA-C  ondansetron (ZOFRAN-ODT) 8 MG disintegrating tablet DISSOLVE 1 TABLET BY MOUTH EVERY 8 HOURS AS NEEDED FOR UP TO 7 DAYS FOR NAUSEA OR VOMITING. 01/18/20 01/17/21  Charise Killian, MD  pantoprazole (PROTONIX) 40 MG tablet TAKE 1 TABLET BY MOUTH DAILY. Patient taking differently: Take 40 mg by mouth 2 (two) times daily. 03/28/20 03/28/21  Flinchum, Eula Fried, FNP  Vitamin D, Ergocalciferol, (DRISDOL) 1.25 MG (50000 UNIT) CAPS capsule TAKE 1 CAPSULE BY MOUTH EVERY 7 (SEVEN) DAYS. WALK IN LAB IN OFFICE 1-2 WEEKS AFTER COMPLETING PRESCRIPTION. Patient taking differently: Take 50,000 Units by mouth every 7 (seven) days. 03/06/20  03/06/21  Flinchum, Eula FriedMichelle S, FNP    Allergies    Patient has no known allergies.  Review of Systems   Review of Systems  Constitutional: Negative for chills and fever.  HENT: Negative for congestion, rhinorrhea and sore throat.   Eyes: Negative for visual disturbance.  Respiratory: Negative for cough and shortness of breath.   Cardiovascular: Negative for chest pain.  Gastrointestinal: Positive for abdominal pain, nausea and vomiting. Negative for abdominal distention, anal bleeding, blood in stool, constipation, diarrhea and rectal pain.  Genitourinary: Negative for decreased urine volume, difficulty urinating, flank pain, genital sores, hematuria, pelvic pain, urgency, vaginal bleeding, vaginal discharge and vaginal pain.  Musculoskeletal: Negative for back pain, neck pain and neck stiffness.  Skin: Negative for color change and rash.  Neurological: Negative for dizziness, syncope, light-headedness and headaches.  Psychiatric/Behavioral: Negative for confusion.    Physical Exam Updated Vital Signs BP (!) 184/89   Pulse 60   Temp 98  F (36.7 C) (Oral)   Resp 17   SpO2 99%   Physical Exam Vitals and nursing note reviewed.  Constitutional:      General: She is not in acute distress.    Appearance: She is not ill-appearing, toxic-appearing or diaphoretic.  HENT:     Head: Normocephalic.  Eyes:     General: No scleral icterus.       Right eye: No discharge.        Left eye: No discharge.  Cardiovascular:     Rate and Rhythm: Normal rate.     Heart sounds: Normal heart sounds.  Pulmonary:     Effort: Pulmonary effort is normal. No respiratory distress.     Breath sounds: Normal breath sounds. No stridor.  Abdominal:     General: Abdomen is protuberant. Bowel sounds are normal. There is no distension. There are no signs of injury.     Palpations: Abdomen is soft.     Tenderness: There is no abdominal tenderness. There is no right CVA tenderness, left CVA tenderness, guarding or rebound.     Hernia: There is no hernia in the umbilical area or ventral area.     Comments: Exam hindered by body habitus  Musculoskeletal:     Cervical back: Neck supple.  Skin:    General: Skin is warm and dry.  Neurological:     General: No focal deficit present.     Mental Status: She is alert.  Psychiatric:        Behavior: Behavior is cooperative.     ED Results / Procedures / Treatments   Labs (all labs ordered are listed, but only abnormal results are displayed) Labs Reviewed  COMPREHENSIVE METABOLIC PANEL - Abnormal; Notable for the following components:      Result Value   Sodium 134 (*)    Potassium 3.4 (*)    Chloride 97 (*)    Glucose, Bld 114 (*)    ALT 50 (*)    All other components within normal limits  CBC WITH DIFFERENTIAL/PLATELET - Abnormal; Notable for the following components:   WBC 12.0 (*)    RBC 5.12 (*)    Hemoglobin 15.9 (*)    HCT 46.1 (*)    Neutro Abs 9.3 (*)    All other components within normal limits  URINALYSIS, ROUTINE W REFLEX MICROSCOPIC - Abnormal; Notable for the following  components:   APPearance CLOUDY (*)    Hgb urine dipstick MODERATE (*)    Ketones, ur 80 (*)  Protein, ur 100 (*)    Leukocytes,Ua SMALL (*)    RBC / HPF >50 (*)    Bacteria, UA RARE (*)    All other components within normal limits  LIPASE, BLOOD    EKG EKG Interpretation  Date/Time:  Monday April 22 2020 17:44:27 EDT Ventricular Rate:  64 PR Interval:  102 QRS Duration: 78 QT Interval:  450 QTC Calculation: 464 R Axis:   54 Text Interpretation: Sinus rhythm with short PR with Premature atrial complexes Otherwise normal ECG Confirmed by Tilden Fossa [phone]) on 04/22/2020 7:24:00 PM   Radiology ECHOCARDIOGRAM COMPLETE  Result Date: 04/22/2020    ECHOCARDIOGRAM REPORT   Patient Name:   [facility] Hoctor Date of Exam: 04/21/2020 Medical Rec #:  [id number]  Height:       63.0 in Accession #:    [id number] Weight:       223.3 lb Date of Birth:  04-15-1985  BSA:          2.026 m Patient Age:    34 years   BP:           153/81 mmHg Patient Gender: F          HR:           77 bpm. Exam Location:  [facility] Procedure: 2D Echo Indications:     R07.9* Chest pain, unspecified  History:         Patient has no prior history of Echocardiogram examinations.  Sonographer:     Overton Mam RDCS Referring Phys:  [id number] AGBATA Diagnosing Phys: Arnoldo Hooker MD IMPRESSIONS  1. Left ventricular ejection fraction, by estimation, is 60 to 65%. The left ventricle has normal function. The left ventricle has no regional wall motion abnormalities. Left ventricular diastolic parameters were normal.  2. Right ventricular systolic function is normal. The right ventricular size is normal.  3. The mitral valve is normal in structure. No evidence of mitral valve regurgitation.  4. The aortic valve is normal in structure. Aortic valve regurgitation is not visualized. FINDINGS  Left Ventricle: Left ventricular ejection fraction, by estimation, is 60 to 65%. The left ventricle has normal function. The left ventricle has  no regional wall motion abnormalities. The left ventricular internal cavity size was normal in size. There is  no left ventricular hypertrophy. Left ventricular diastolic parameters were normal. Right Ventricle: The right ventricular size is normal. Right vetricular wall thickness was not assessed. Right ventricular systolic function is normal. Left Atrium: Left atrial size was normal in size. Right Atrium: Right atrial size was normal in size. Pericardium: There is no evidence of pericardial effusion. Mitral Valve: The mitral valve is normal in structure. No evidence of mitral valve regurgitation. Tricuspid Valve: The tricuspid valve is normal in structure. Tricuspid valve regurgitation is trivial. Aortic Valve: The aortic valve is normal in structure. Aortic valve regurgitation is not visualized. Aortic valve peak gradient measures 7.2 mmHg. Pulmonic Valve: The pulmonic valve was not well visualized. Pulmonic valve regurgitation is not visualized. Aorta: The aortic root is normal in size and structure. IAS/Shunts: The interatrial septum was not assessed.  LEFT VENTRICLE PLAX 2D LVIDd:         4.44 cm  Diastology LVIDs:         3.01 cm  LV e' medial:    10.00 cm/s LV PW:         1.14 cm  LV E/e' medial:  8.1 LV IVS:        1.08  cm  LV e' lateral:   15.30 cm/s LVOT diam:     2.00 cm  LV E/e' lateral: 5.3 LV SV:         68 LV SV Index:   33 LVOT Area:     3.14 cm  RIGHT VENTRICLE RV Basal diam:  3.29 cm LEFT ATRIUM           Index       RIGHT ATRIUM           Index LA diam:      3.20 cm 1.58 cm/m  RA Area:     17.30 cm LA Vol (A2C): 23.8 ml 11.74 ml/m RA Volume:   46.30 ml  22.85 ml/m LA Vol (A4C): 27.5 ml 13.57 ml/m  AORTIC VALVE                PULMONIC VALVE AV Area (Vmax): 2.44 cm    PV Vmax:       1.02 m/s AV Vmax:        134.00 cm/s PV Peak grad:  4.2 mmHg AV Peak Grad:   7.2 mmHg LVOT Vmax:      104.00 cm/s LVOT Vmean:     73.100 cm/s LVOT VTI:       0.216 m  AORTA Ao Root diam: 2.80 cm MITRAL VALVE MV  Area (PHT): 3.48 cm    SHUNTS MV Decel Time: 218 msec    Systemic VTI:  0.22 m MV E velocity: 80.60 cm/s  Systemic Diam: 2.00 cm MV A velocity: 67.70 cm/s MV E/A ratio:  1.19 Arnoldo Hooker MD Electronically signed by Arnoldo Hooker MD Signature Date/Time: 04/22/2020/7:24:55 AM    Final     Procedures Procedures   Medications Ordered in ED Medications  sodium chloride 0.9 % bolus 1,000 mL (1,000 mLs Intravenous New Bag/Given 04/22/20 2046)  prochlorperazine (COMPAZINE) injection 10 mg (10 mg Intravenous Given 04/22/20 2053)    ED Course  I have reviewed the triage vital signs and the nursing notes.  Pertinent labs & imaging results that were available during my care of the patient were reviewed by me and considered in my medical decision making (see chart for details).    MDM Rules/Calculators/A&P                          Alert 35 year old female no acute distress, nontoxic appearing.  Patient is comfortably easily aroused to voice.  Patient presents with chief complaint of nausea, vomiting, and epigastric pain.  Patient reports she has been dealing with these symptoms over the past few.  The last few weeks.  Patient denies any fevers, chill, bloody emesis or coffee-ground emesis.  Per chart review patient was seen at [facility] 04/20/20 as well as yesterday for her nausea and vomiting.  Patient had EGD with biopsy performed 3/31.  CT scan of abdomen pelvis performed on 01/17/2020 showed no acute intra-abdominal abnormality, scattered sigmoid diverticula.  On physical exam normoactive bowel sounds, abdomen soft, nondistended, nontender.  Will be patient will need a fluid bolus Compazine for her nausea and vomiting.  Lipase, CMP, CBC, urinalysis obtained while patient was in triage.  Lipase within normal limits low suspicion for acute pancreatitis. CBC shows slight leukocytosis at 12 however this is improved from 15 yesterday.  Patient's leukocytosis may be due to acute phase  reaction from her cyclic nausea and vomiting. Patient has no signs of anemia. CMP shows sodium, chloride and potassium slightly  decreased; likely secondary to her vomiting.  BUN/creatinine within normal limits low suspicion for dehydration.  AST slightly elevated at 50. Urinalysis shows bacteriuria, leukocytes small, nitrate negative, RBC greater than 50, ketones 80, protein 100.  Low suspicion for urinary tract infection as patient is asymptomatic;bacteria seen may be due to contamination from squamous epithelial cells.  Patient reports that she is just beginning her menstrual period.  RBC may be from menstrual period.  Low suspicion for renal calculi as patient has no flank pain or other urinary complaints.  Will have patient follow-up with her primary care provider for repeat testing.  After receiving housing patient has no further episodes of vomiting.  Patient continues to relax comfortably in no acute distress.  Patient noted to have hypertension throughout her ED stay.  Patient reports that she was prescribed medication by her primary care provider but told not to start it.  We will have patient follow-up with her primary care provider for hypertension management.  Discussed results, findings, treatment and follow up. Patient advised of return precautions. Patient verbalized understanding and agreed with plan.    Final Clinical Impression(s) / ED Diagnoses Final diagnoses:  Non-intractable vomiting with nausea, unspecified vomiting type  Epigastric pain    Rx / DC Orders ED Discharge Orders    None       Berneice Heinrich 04/23/20 0153    Tilden Fossa, MD 04/24/20 [phone]

## 2020-04-22 NOTE — ED Triage Notes (Signed)
Pt with central abdominal pain and emesis since Saturday. One episode of diarrhea this morning. Seen for Same at [facility].

## 2020-04-22 NOTE — Discharge Instructions (Signed)
You came to the emergency department today to be evaluated for your nausea, vomiting, and epigastric pain.  Physical exam was reassuring lab work were reassuring.  Your urinalysis showed that you had microscopic blood in your urine.  Please follow-up with your primary care doctor about this.  You were noted to have high blood pressure while in the emergency department.  Please also follow-up with your primary care provider about starting medication for this.  Please follow-up with your gastroenterologist for your recurrent nausea, vomiting, and abdominal pain.  Get help right away if: You have pain in your chest, neck, arm, or jaw. You feel extremely weak or you faint. You have persistent vomiting. You have vomit that is bright red or looks like black coffee grounds. You have bloody or black stools or stools that look like tar. You have a severe headache, a stiff neck, or both. You have severe pain, cramping, or bloating in your abdomen. You have difficulty breathing, or you are breathing very quickly. Your heart is beating very quickly. Your skin feels cold and clammy. You feel confused. You have signs of dehydration, such as: Dark urine, very little urine, or no urine. Cracked lips. Dry mouth. Sunken eyes. Sleepiness. Weakness.

## 2020-04-25 ENCOUNTER — Other Ambulatory Visit: Payer: Self-pay | Admitting: *Deleted

## 2020-04-25 ENCOUNTER — Encounter: Payer: Self-pay | Admitting: *Deleted

## 2020-04-25 NOTE — Patient Outreach (Addendum)
[facility]) Care Management  04/25/2020  Maxi Carreras 20-Sep-1985 [id number]   Transition of care telephone call/Case Closure   Referral received:04/25/20 Initial outreach:04/25/20 Insurance: [facility]  Initial unsuccessful telephone call to patient's preferred number in order to complete transition of care assessment; no answer, left HIPAA compliant voicemail message requesting return call.   Objective:  Tamirah George was hospitalized at [facility] 4/16-4/17/22  For Nausea & Vomiting, bradycardia.  Comorbidities include: Obesity, GERD , Hypertension and Asthma, refractory  Nausea and vomiting, anxiety and depression. She was discharged to home on 04/21/20  without the need for home health services or DME. ED visit on 4/18, Dx: Non intractable vomiting with nausea, epigastric pain .   Addendum  Incoming call from patient  Subjective:  2 HIPAA identifiers verified. Explained purpose of call and completed transition of care assessment.  Beverely states that she is feeling much better she has returned to work. She denies having further episodes of vomiting, she is eating at bland diet. She reports that she continues to have episode of nausea and taking medication as needed. She discussed ongoing problem with nausea and vomiting since initial covid 19 vaccine in 2021. She is tolerating diet, denies bowel or bladder problems.Reinforced importance of staying hydrated  Patient parents available to assist  with her  recovery.   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 She does not have the hospital indemnity She  uses a [facility] outpatient pharmacy at [facility] outpatient pharmacy.   Assessment:  Patient voices good understanding of all discharge instructions.  See  transition of care flowsheet for assessment details.Patient discussed plans to follow up with PCP as she transitioned to another practice.   Plan:  Reviewed hospital discharge diagnosis of Nausea/vomiting, bradycardia   and discharge treatment plan using hospital discharge instructions, assessing medication adherence, reviewing problems requiring provider notification, and discussing the importance of follow up with surgeon, primary care provider and/or specialists as directed.  Reviewed [employer] healthy lifestyle program information to receive discounted premium for  2023   Step 1: Get  your annual physical  Step 2: Complete your health assessment  Step 3:Identify your current health status and complete the corresponding action step between January 06, 2020 and September 05, 2020.      No ongoing care management needs identified so will close case to [facility] Care Management services and route successful outreach letter with [facility] Care Management pamphlet and 24 Hour Nurse Line Magnet to [employer] Care Management clinical pool to be mailed to patient's home address.  Thanked patient for their services to [facility].     Egbert Garibaldi, RN, BSN  [facility] Care Management,Care Management Coordinator  [phone]- Mobile [phone]- Toll Free Main Office

## 2020-04-30 ENCOUNTER — Ambulatory Visit: Payer: No Typology Code available for payment source | Admitting: *Deleted

## 2020-05-14 ENCOUNTER — Encounter: Payer: Self-pay | Admitting: *Deleted

## 2020-05-16 ENCOUNTER — Other Ambulatory Visit: Payer: Self-pay

## 2020-05-16 ENCOUNTER — Encounter: Payer: Self-pay | Admitting: Gastroenterology

## 2020-05-16 ENCOUNTER — Ambulatory Visit (INDEPENDENT_AMBULATORY_CARE_PROVIDER_SITE_OTHER): Payer: No Typology Code available for payment source | Admitting: Gastroenterology

## 2020-05-16 DIAGNOSIS — R112 Nausea with vomiting, unspecified: Secondary | ICD-10-CM | POA: Diagnosis not present

## 2020-05-16 NOTE — Progress Notes (Addendum)
Primary Care Physician: Bonnie Pap, FNP  Primary Gastroenterologist:  Dr. Midge Shepard  Chief Complaint  Patient presents with   Follow-up    HPI: Bonnie Shepard is a 35 y.o. female here for follow-up after being discharged from the hospital.  The patient was discharged from the hospital after having an episode of her nausea with a finding of bradycardia.  The patient was evaluated by cardiology and told to follow up with me. The patient continues to have these episodes of nausea that she cannot attribute anything she is doing.  She denies any hematemesis.  The patient has had hemorrhoidal banding by Dr. Allegra Shepard and brings and pictures today where she has bright red blood per rectum typically lasting 3 days every few weeks.  The patient had multiple banding sessions of her hemorrhoids and reports that the banding sessions were uneventful.  The patient had a upper endoscopy that did not show any cause for her symptoms.  Past Medical History:  Diagnosis Date   Acid reflux    Asthma    Family history of adverse reaction to anesthesia    Mother - PONV   Infection    freq UTI   Vaginal Shepard smear, abnormal    f/u wnl   Wears contact lenses     Current Outpatient Medications  Medication Sig Dispense Refill   albuterol (VENTOLIN HFA) 108 (90 Base) MCG/ACT inhaler Inhale 2 puffs into the lungs every 4 (four) hours as needed.     Azelastine HCl 0.15 % SOLN PLACE 2 SPRAYS INTO THE NOSE DAILY. 30 mL 0   escitalopram (LEXAPRO) 5 MG tablet TAKE 1 TABLET BY MOUTH DAILY. 270 tablet 1   fluticasone (FLONASE) 50 MCG/ACT nasal spray PLACE 2 SPRAYS INTO BOTH NOSTRILS DAILY. 16 g 6   levocetirizine (XYZAL) 5 MG tablet TAKE 1 TABLET BY MOUTH EVERY EVENING. (Patient taking differently: Take 5 mg by mouth every evening.) 30 tablet 3   methocarbamol (ROBAXIN) 500 MG tablet TAKE 1 TABLET BY MOUTH 3 TIMES A DAY BY MOUTH FOR 14 DAYS. (Patient taking differently: Take 500 mg by mouth daily.) 42 tablet 0    ondansetron (ZOFRAN-ODT) 8 MG disintegrating tablet DISSOLVE 1 TABLET BY MOUTH EVERY 8 HOURS AS NEEDED FOR UP TO 7 DAYS FOR NAUSEA OR VOMITING. 21 tablet 0   pantoprazole (PROTONIX) 40 MG tablet TAKE 1 TABLET BY MOUTH DAILY. (Patient taking differently: Take 40 mg by mouth 2 (two) times daily.) 90 tablet 3   Vitamin D, Ergocalciferol, (DRISDOL) 1.25 MG (50000 UNIT) CAPS capsule TAKE 1 CAPSULE BY MOUTH EVERY 7 (SEVEN) DAYS. WALK IN LAB IN OFFICE 1-2 WEEKS AFTER COMPLETING PRESCRIPTION. (Patient taking differently: Take 50,000 Units by mouth every 7 (seven) days.) 12 capsule 1   hydrochlorothiazide (HYDRODIURIL) 25 MG tablet TAKE 0.5 TABLETS BY MOUTH DAILY. (Patient not taking: No sig reported) 90 tablet 3   No current facility-administered medications for this visit.    Allergies as of 05/16/2020   (No Known Allergies)    ROS:  General: Negative for anorexia, weight loss, fever, chills, fatigue, weakness. ENT: Negative for hoarseness, difficulty swallowing , nasal congestion. CV: Negative for chest pain, angina, palpitations, dyspnea on exertion, peripheral edema.  Respiratory: Negative for dyspnea at rest, dyspnea on exertion, cough, sputum, wheezing.  GI: See history of present illness. GU:  Negative for dysuria, hematuria, urinary incontinence, urinary frequency, nocturnal urination.  Endo: Negative for unusual weight change.    Physical Examination:   BP 126/74  Pulse 91   Temp 97.8 F (36.6 C) (Temporal)   Ht 5\' 3"  (1.6 m)   Wt 235 lb 12.8 oz (107 kg)   BMI 41.77 kg/m   General: Well-nourished, well-developed in no acute distress.  Eyes: No icterus. Conjunctivae pink. Neuro: Alert and oriented x 3.  Grossly intact. Skin: no jaundice.   Psych: Alert and cooperative, normal mood and affect.  Labs:    Imaging Studies: ECHOCARDIOGRAM COMPLETE  Result Date: 04/22/2020    ECHOCARDIOGRAM REPORT   Patient Name:   Bonnie Shepard Date of Exam: 04/21/2020 Medical Rec #:  04/23/2020   Height:       63.0 in Accession #:    [id number] Weight:       223.3 lb Date of Birth:  [birth year]  BSA:          2.026 m Patient Age:    34 years   BP:           153/81 mmHg Patient Gender: F          HR:           77 bpm. Exam Location:  [facility] Procedure: 2D Echo Indications:     R07.9* Chest pain, unspecified  History:         Patient has no prior history of Echocardiogram examinations.  Sonographer:     [birth year]1972 RDCS Referring Phys:  Overton Mam AGBATA Diagnosing Phys: [id number] MD IMPRESSIONS  1. Left ventricular ejection fraction, by estimation, is 60 to 65%. The left ventricle has normal function. The left ventricle has no regional wall motion abnormalities. Left ventricular diastolic parameters were normal.  2. Right ventricular systolic function is normal. The right ventricular size is normal.  3. The mitral valve is normal in structure. No evidence of mitral valve regurgitation.  4. The aortic valve is normal in structure. Aortic valve regurgitation is not visualized. FINDINGS  Left Ventricle: Left ventricular ejection fraction, by estimation, is 60 to 65%. The left ventricle has normal function. The left ventricle has no regional wall motion abnormalities. The left ventricular internal cavity size was normal in size. There is  no left ventricular hypertrophy. Left ventricular diastolic parameters were normal. Right Ventricle: The right ventricular size is normal. Right vetricular wall thickness was not assessed. Right ventricular systolic function is normal. Left Atrium: Left atrial size was normal in size. Right Atrium: Right atrial size was normal in size. Pericardium: There is no evidence of pericardial effusion. Mitral Valve: The mitral valve is normal in structure. No evidence of mitral valve regurgitation. Tricuspid Valve: The tricuspid valve is normal in structure. Tricuspid valve regurgitation is trivial. Aortic Valve: The aortic valve is normal in structure. Aortic valve  regurgitation is not visualized. Aortic valve peak gradient measures 7.2 mmHg. Pulmonic Valve: The pulmonic valve was not well visualized. Pulmonic valve regurgitation is not visualized. Aorta: The aortic root is normal in size and structure. IAS/Shunts: The interatrial septum was not assessed.  LEFT VENTRICLE PLAX 2D LVIDd:         4.44 cm  Diastology LVIDs:         3.01 cm  LV e' medial:    10.00 cm/s LV PW:         1.14 cm  LV E/e' medial:  8.1 LV IVS:        1.08 cm  LV e' lateral:   15.30 cm/s LVOT diam:     2.00 cm  LV E/e' lateral: 5.3 LV SV:  68 LV SV Index:   33 LVOT Area:     3.14 cm  RIGHT VENTRICLE RV Basal diam:  3.29 cm LEFT ATRIUM           Index       RIGHT ATRIUM           Index LA diam:      3.20 cm 1.58 cm/m  RA Area:     17.30 cm LA Vol (A2C): 23.8 ml 11.74 ml/m RA Volume:   46.30 ml  22.85 ml/m LA Vol (A4C): 27.5 ml 13.57 ml/m  AORTIC VALVE                PULMONIC VALVE AV Area (Vmax): 2.44 cm    PV Vmax:       1.02 m/s AV Vmax:        134.00 cm/s PV Peak grad:  4.2 mmHg AV Peak Grad:   7.2 mmHg LVOT Vmax:      104.00 cm/s LVOT Vmean:     73.100 cm/s LVOT VTI:       0.216 m  AORTA Ao Root diam: 2.80 cm MITRAL VALVE MV Area (PHT): 3.48 cm    SHUNTS MV Decel Time: 218 msec    Systemic VTI:  0.22 m MV E velocity: 80.60 cm/s  Systemic Diam: 2.00 cm MV A velocity: 67.70 cm/s MV E/A ratio:  1.19 Arnoldo Hooker MD Electronically signed by Arnoldo Hooker MD Signature Date/Time: 04/22/2020/7:24:55 AM    Final     Assessment and Plan:   Bonnie Shepard is a 35 y.o. y/o female who comes in today with episodes of nausea the most recent episode having cardiac issues with bradycardia.  The patient states that she has had these episodes with significant hypertension in the past.  There is no inciting events for her nausea with vomiting. The patient's upper endoscopy did not show any cause for her symptoms and she has been told that the next step would be evaluating her gallbladder for possible  dysfunction.  The patient will be set up for a gallbladder emptying study with a right upper quadrant ultrasound.  The patient has been explained the plan and agrees with it.     Bonnie Minium, MD. Clementeen Graham    Note: This dictation was prepared with Dragon dictation along with smaller phrase technology. Any transcriptional errors that result from this process are unintentional.

## 2020-06-05 ENCOUNTER — Ambulatory Visit: Payer: Self-pay | Admitting: *Deleted

## 2020-06-05 NOTE — Telephone Encounter (Signed)
FYI. Former michelle pt.

## 2020-06-05 NOTE — Telephone Encounter (Signed)
Pt called with complaints of swelling in her hands and feet that started 06/05/20 around 0600; she says she can not close her hands and it has worsened throuhout the day; the pt says she can barely close her hands;the pt also says she has back pain and she believes something may be pinched in her back and causing swelling; she says the only other time she had hand swelling was when she was pregnant; the pt says she can see the imprint of her socks when she takes them off; the pt says she went camping last weekend and she has multiple insect bites; she also complains of headaches from temple to temple for the last 2 weeks; recommendations made per nurse triage protocol; she verbalized understanding and asked if Urgent Care is an option; explained that this triage RNs recommendation is for her to go to the ED per previously stated guidelines; explained that even if she goes to the Urgent Care, they may still refer her to the ED; the pt was previously seen by Marvell Fuller at [facility]; will route to office for notification of encounter. Reason for Disposition . [1] Can't use hand or can barely use hand AND [2] new-onset  Answer Assessment - Initial Assessment Questions 1. ONSET: "When did the swelling start?" (e.g., minutes, hours, days)     bilateral hand swelling 2. LOCATION: "What part of the hand is swollen?"  "Are both hands swollen or just one hand?"     Both R>L 3. SEVERITY: "How bad is the swelling?" (e.g., localized; mild, moderate, severe)   - BALL OR LUMP: small ball or lump   - LOCALIZED: puffy or swollen area or patch of skin   - JOINT SWELLING: swelling of a joint   - MILD: puffiness or mild swelling of fingers or hand   - MODERATE: fingers and hand are swollen   - SEVERE: swelling of entire hand and up into forearm     R>L, moderate 4. REDNESS: "Does the swelling look red or infected?"     yes 5. PAIN: "Is the swelling painful to touch?" If Yes, ask: "How painful is it?"    (Scale 1-10; mild, moderate or severe)  no 6. FEVER: "Do you have a fever?" If Yes, ask: "What is it, how was it measured, and when did it start?"     no 7. CAUSE: "What do you think is causing the hand swelling?" (e.g., heat, insect bite, pregnancy, recent injury)     Pt not sure; went camping over the weekend and has several insect bits on her legs 8. MEDICAL HISTORY: "Do you have a history of heart failure, kidney disease, liver failure, or cancer?"    Hx bradycardia 9. RECURRENT SYMPTOM: "Have you had hand swelling before?" If Yes, ask: "When was the last time?" "What happened that time?"   no 10. OTHER SYMPTOMS: "Do you have any other symptoms?" (e.g., blurred vision, difficulty breathing, headache)     Ongoing daily headaches x 2 weeks: located temple to temple  11. PREGNANCY: "Is there any chance you are pregnant?" "When was your last menstrual period?"      No LMP 05/21/20  Protocols used: HAND [facility]

## 2020-06-06 ENCOUNTER — Encounter: Payer: Self-pay | Admitting: Emergency Medicine

## 2020-06-06 ENCOUNTER — Emergency Department
Admission: EM | Admit: 2020-06-06 | Discharge: 2020-06-06 | Disposition: A | Discharge status: Home / Self Care | Payer: No Typology Code available for payment source | Attending: Emergency Medicine | Admitting: Emergency Medicine

## 2020-06-06 ENCOUNTER — Other Ambulatory Visit: Payer: Self-pay

## 2020-06-06 ENCOUNTER — Telehealth: Payer: Self-pay

## 2020-06-06 DIAGNOSIS — Z79899 Other long term (current) drug therapy: Secondary | ICD-10-CM | POA: Insufficient documentation

## 2020-06-06 DIAGNOSIS — M5442 Lumbago with sciatica, left side: Secondary | ICD-10-CM | POA: Diagnosis not present

## 2020-06-06 DIAGNOSIS — M545 Low back pain, unspecified: Secondary | ICD-10-CM | POA: Diagnosis present

## 2020-06-06 DIAGNOSIS — F17210 Nicotine dependence, cigarettes, uncomplicated: Secondary | ICD-10-CM | POA: Insufficient documentation

## 2020-06-06 DIAGNOSIS — Z7951 Long term (current) use of inhaled steroids: Secondary | ICD-10-CM | POA: Diagnosis not present

## 2020-06-06 DIAGNOSIS — J45909 Unspecified asthma, uncomplicated: Secondary | ICD-10-CM | POA: Diagnosis not present

## 2020-06-06 DIAGNOSIS — N3001 Acute cystitis with hematuria: Secondary | ICD-10-CM | POA: Diagnosis not present

## 2020-06-06 DIAGNOSIS — I10 Essential (primary) hypertension: Secondary | ICD-10-CM | POA: Insufficient documentation

## 2020-06-06 LAB — URINALYSIS, COMPLETE (UACMP) WITH MICROSCOPIC
Bilirubin Urine: NEGATIVE
Glucose, UA: NEGATIVE mg/dL
Ketones, ur: NEGATIVE mg/dL
Nitrite: NEGATIVE
Protein, ur: NEGATIVE mg/dL
Specific Gravity, Urine: 1.019 (ref 1.005–1.030)
pH: 6 (ref 5.0–8.0)

## 2020-06-06 LAB — POC URINE PREG, ED: Preg Test, Ur: NEGATIVE

## 2020-06-06 MED ORDER — ACETAMINOPHEN 500 MG PO TABS
1000.0000 mg | ORAL_TABLET | Freq: Once | ORAL | Status: AC
  Administered 2020-06-06: 1000 mg via ORAL
  Filled 2020-06-06: qty 2

## 2020-06-06 MED ORDER — NAPROXEN 500 MG PO TABS
500.0000 mg | ORAL_TABLET | Freq: Once | ORAL | Status: AC
  Administered 2020-06-06: 500 mg via ORAL
  Filled 2020-06-06: qty 1

## 2020-06-06 MED ORDER — LIDOCAINE 5 % EX PTCH
1.0000 | MEDICATED_PATCH | CUTANEOUS | Status: DC
  Administered 2020-06-06: 1 via TRANSDERMAL
  Filled 2020-06-06: qty 1

## 2020-06-06 MED ORDER — CEPHALEXIN 500 MG PO CAPS
500.0000 mg | ORAL_CAPSULE | Freq: Four times a day (QID) | ORAL | 0 refills | Status: AC
  Filled 2020-06-06: qty 28, 7d supply, fill #0

## 2020-06-06 NOTE — ED Notes (Signed)
Discharge instructions reviewed , Pt calm , collective , denied pain or sob.

## 2020-06-06 NOTE — ED Provider Notes (Signed)
[facility] Emergency Department Provider Note  ____________________________________________   None    (approximate)  I have reviewed the triage vital signs and the nursing notes.   HISTORY  Chief Complaint Back Pain   HPI Bonnie Shepard is a 35 y.o. female with a past medical history of asthma, GERD, frequent UTIs and chronic urinary incontinence after pregnancy couple years ago who presents for assessment of some acute nontraumatic left lower back pain.  Patient states she works in [employer] and does not remember heavy lifting but does not recall any specific lifting twisting or injuries.  She states she took some ibuprofen yesterday and the Flexeril but this did not help.  She denies any other associated fevers, headache, earache, sore throat, chest pain, cough, shortness of breath, abdominal pain, upper back pain, urinary symptoms, rash or other acute associated sick symptoms.  She said she has had back pain very similar to this in the past but today felt little worse and did not respond to Flexeril and ibuprofen the way it has in the past.  She denies any IV drug use or other illicit drug use.  Denies any significant EtOH use.  No other acute concerns at this time.         Past Medical History:  Diagnosis Date  . Acid reflux   . Asthma   . Family history of adverse reaction to anesthesia    Mother - PONV  . Infection    freq UTI  . Vaginal Pap smear, abnormal    f/u wnl  . Wears contact lenses     Patient Active Problem List   Diagnosis Date Noted  . Bradycardia with 41-50 beats per minute 04/20/2020  . No-show for appointment 04/18/2020  . Refractory nausea and vomiting   . Diverticulitis 03/28/2020  . Suspected COVID-19 virus infection 03/28/2020  . Non-recurrent acute serous otitis media of right ear 03/28/2020  . Hypertension 03/28/2020  . Allergic rhinitis 03/28/2020  . Bacteria in urine 02/07/2020  . History of dehydration  02/07/2020  . Intractable vomiting with nausea 01/17/2020  . History of foot fracture- left  10/10/2019  . Fatigue 08/09/2019  . Lymphadenopathy- posterior left side  08/09/2019  . GERD (gastroesophageal reflux disease) 07/21/2019  . History of nausea and vomiting 07/21/2019  . Epigastric burning sensation 07/21/2019  . Hematochezia   . Hemorrhoids, external, thrombosed 03/23/2019  . Rectal or anal pain 03/23/2019  . History of rectal bleeding 03/23/2019  . Trichomonas vaginitis 02/28/2019  . Vaginal high risk HPV DNA test positive 02/28/2019  . Anxiety with depression 02/23/2019  . Body mass index (BMI) of 40.1-44.9 in adult (HCC) 02/23/2019  . Left foot pain 02/23/2019  . Cervical cancer screening 02/23/2019  . Morbid obesity (HCC) 02/21/2019  . Itching in the vaginal area 02/21/2019  . History of pregnancy loss in prior pregnancy, currently pregnant in second trimester 07/10/2016  . Incontinence overflow, stress female 12/03/2015  . Nausea 01/25/2008  . Diarrhea of presumed infectious origin 01/25/2008    Past Surgical History:  Procedure Laterality Date  . COLONOSCOPY WITH PROPOFOL N/A 04/03/2019   Procedure: COLONOSCOPY WITH PROPOFOL;  Surgeon: Midge MiniumWohl, Darren, MD;  [facility];  Service: Endoscopy;  Laterality: N/A;  Priority 3  . DILATION AND EVACUATION N/A 09/07/2013   Procedure: DILATATION AND EVACUATION;  Surgeon: Freddrick MarchKendra H. Tenny Crawoss, MD;  Location: [facility];  Service: Gynecology;  Laterality: N/A;  . ESOPHAGOGASTRODUODENOSCOPY (EGD) WITH PROPOFOL N/A 04/04/2020   Procedure:  ESOPHAGOGASTRODUODENOSCOPY (EGD) WITH BIOPSY;  Surgeon: Midge Minium, MD;  Location: [facility];  Service: Endoscopy;  Laterality: N/A;  . TONSILLECTOMY      Prior to Admission medications   Medication Sig Start Date End Date Taking? Authorizing Provider  cephALEXin (KEFLEX) 500 MG capsule Take 1 capsule (500 mg total) by mouth 4 (four) times daily for 7 days. 06/06/20 06/13/20 Yes Gilles Chiquito, MD  albuterol (VENTOLIN HFA) 108 (90 Base) MCG/ACT inhaler Inhale 2 puffs into the lungs every 4 (four) hours as needed. 03/28/20   [provider]  Azelastine HCl 0.15 % SOLN PLACE 2 SPRAYS INTO THE NOSE DAILY. 02/07/20 02/06/21  Flinchum, Eula Fried, FNP  escitalopram (LEXAPRO) 5 MG tablet TAKE 1 TABLET BY MOUTH DAILY. 03/28/20 03/28/21  Flinchum, Eula Fried, FNP  fluticasone (FLONASE) 50 MCG/ACT nasal spray PLACE 2 SPRAYS INTO BOTH NOSTRILS DAILY. 03/28/20 03/28/21  Flinchum, Eula Fried, FNP  hydrochlorothiazide (HYDRODIURIL) 25 MG tablet TAKE 0.5 TABLETS BY MOUTH DAILY. Patient not taking: No sig reported 03/28/20 03/28/21  Flinchum, Eula Fried, FNP  levocetirizine (XYZAL) 5 MG tablet TAKE 1 TABLET BY MOUTH EVERY EVENING. Patient taking differently: Take 5 mg by mouth every evening. 03/28/20 03/28/21  Flinchum, Eula Fried, FNP  methocarbamol (ROBAXIN) 500 MG tablet TAKE 1 TABLET BY MOUTH 3 TIMES A DAY BY MOUTH FOR 14 DAYS. Patient taking differently: Take 500 mg by mouth daily. 12/15/19 12/14/20  Ivar Drape, PA-C  ondansetron (ZOFRAN-ODT) 8 MG disintegrating tablet DISSOLVE 1 TABLET BY MOUTH EVERY 8 HOURS AS NEEDED FOR UP TO 7 DAYS FOR NAUSEA OR VOMITING. 01/18/20 01/17/21  Charise Killian, MD  pantoprazole (PROTONIX) 40 MG tablet TAKE 1 TABLET BY MOUTH DAILY. Patient taking differently: Take 40 mg by mouth 2 (two) times daily. 03/28/20 03/28/21  Flinchum, Eula Fried, FNP  Vitamin D, Ergocalciferol, (DRISDOL) 1.25 MG (50000 UNIT) CAPS capsule TAKE 1 CAPSULE BY MOUTH EVERY 7 (SEVEN) DAYS. WALK IN LAB IN OFFICE 1-2 WEEKS AFTER COMPLETING PRESCRIPTION. Patient taking differently: Take 50,000 Units by mouth every 7 (seven) days. 03/06/20 03/06/21  Flinchum, Eula Fried, FNP    Allergies Patient has no known allergies.  Family History  Problem Relation Age of Onset  . Hypertension Other   . Cancer Other   . Hypertension Mother   . Hypertension Father   . Hypertension Maternal  Grandmother   . Heart disease Maternal Grandmother   . Hearing loss Neg Hx     Social History Social History   Tobacco Use  . Smoking status: Current Every Day Smoker    Packs/day: 0.25    Years: 16.00    Pack years: 4.00    Types: Cigarettes  . Smokeless tobacco: Never Used  . Tobacco comment: since age 54  Vaping Use  . Vaping Use: Never used  Substance Use Topics  . Alcohol use: No    Comment: occasionally  . Drug use: No    Review of Systems  Review of Systems  Constitutional: Negative for chills and fever.  HENT: Negative for sore throat.   Eyes: Negative for pain.  Respiratory: Negative for cough and stridor.   Cardiovascular: Negative for chest pain.  Gastrointestinal: Negative for vomiting.  Genitourinary: Negative for dysuria.  Musculoskeletal: Positive for back pain.  Skin: Negative for rash.  Neurological: Negative for seizures, loss of consciousness and headaches.  Psychiatric/Behavioral: Negative for suicidal ideas.  All other systems reviewed and are negative.     ____________________________________________   PHYSICAL EXAM:  VITAL SIGNS: ED Triage Vitals [06/06/20 0701]  Enc Vitals Group     BP      Pulse      Resp      Temp      Temp src      SpO2      Weight 235 lb (106.6 kg)     Height 5\' 3"  (1.6 m)     Head Circumference      Peak Flow      Pain Score 8     Pain Loc      Pain Edu?      Excl. in GC?    Vitals:   06/06/20 0724 06/06/20 0725  BP:  (!) 158/82  Pulse:  68  Resp:  18  Temp:    SpO2: 98% 100%   Physical Exam Vitals and nursing note reviewed.  Constitutional:      General: She is not in acute distress.    Appearance: She is well-developed.  HENT:     Head: Normocephalic and atraumatic.     Right Ear: External ear normal.     Left Ear: External ear normal.     Nose: Nose normal.  Eyes:     Conjunctiva/sclera: Conjunctivae normal.  Cardiovascular:     Rate and Rhythm: Normal rate and regular rhythm.      Heart sounds: No murmur heard.   Pulmonary:     Effort: Pulmonary effort is normal. No respiratory distress.     Breath sounds: Normal breath sounds.  Abdominal:     Palpations: Abdomen is soft.     Tenderness: There is no abdominal tenderness.  Musculoskeletal:     Cervical back: Neck supple.  Skin:    General: Skin is warm and dry.  Neurological:     Mental Status: She is alert and oriented to person, place, and time.  Psychiatric:        Mood and Affect: Mood normal.     Patient has full strength and power throughout her bilateral upper and lower extremities.  2+ bilateral radial and DP pulses.  Sensation is intact to light touch throughout all extremities.  There is some mild tenderness over the left paralumbar muscles and the left SI joint.  No significant spasm felt.  No overlying skin changes including erythema, induration, warmth or any midline spinal tenderness.  Left-sided straight leg test is positive.  Right side is negative.  ____________________________________________   LABS (all labs ordered are listed, but only abnormal results are displayed)  Labs Reviewed  URINALYSIS, COMPLETE (UACMP) WITH MICROSCOPIC - Abnormal; Notable for the following components:      Result Value   Color, Urine YELLOW (*)    APPearance CLOUDY (*)    Hgb urine dipstick MODERATE (*)    Leukocytes,Ua LARGE (*)    Bacteria, UA RARE (*)    All other components within normal limits  URINE CULTURE  POC URINE PREG, ED   ____________________________________________  EKG  ____________________________________________  RADIOLOGY  ED MD interpretation:    Official radiology report(s): No results found.  ____________________________________________   PROCEDURES  Procedure(s) performed (including Critical Care):  Procedures   ____________________________________________   INITIAL IMPRESSION / ASSESSMENT AND PLAN / ED COURSE      Patient presents with above-stated history  exam for assessment of some acute nontraumatic left lower back pain.  On arrival she is afebrile hemodynamically stable.  Exam is remarkable for no focal deficits or evidence of infection.  Suspect likely possible sciatic  pain versus SI joint pain.  Very low suspicion at this time for acute cord compression.  No CVA tenderness fever or other findings to suggest pyelonephritis although UA consistent with possible cystitis with large leukocyte esterase and 11-20 RBCs and 21-50 WBCs with some rare bacteria.  Will suspicion for other invasive bacterial infection at this time.  I will obtain urine culture and treat with a short course of Keflex.  Low suspicion for stone at this time or any other immediate life-threatening intra-abdominal process.  Advised patient to return to emergency room immediately if she experiences any fever, vomiting, diarrhea, constipation, change in her pain or any new numbness weakness or tingling.  Discharged stable condition.  Strict return precautions advised and discussed.       ____________________________________________   FINAL CLINICAL IMPRESSION(S) / ED DIAGNOSES  Final diagnoses:  Acute low back pain with left-sided sciatica, unspecified back pain laterality  Acute cystitis with hematuria    Medications  lidocaine (LIDODERM) 5 % 1 patch (1 patch Transdermal Patch Applied 06/06/20 0732)  acetaminophen (TYLENOL) tablet 1,000 mg (1,000 mg Oral Given 06/06/20 0733)  naproxen (NAPROSYN) tablet 500 mg (500 mg Oral Given 06/06/20 0733)     ED Discharge Orders         Ordered    cephALEXin (KEFLEX) 500 MG capsule  4 times daily        06/06/20 3382           Note:  This document was prepared using Dragon voice recognition software and may include unintentional dictation errors.   Gilles Chiquito, MD 06/06/20 [phone]

## 2020-06-06 NOTE — Telephone Encounter (Signed)
Called pt to schedule hospital follow up appt. Phone rung 3 times and then was silent. No vm left.

## 2020-06-06 NOTE — ED Triage Notes (Signed)
Pt comes into the ED via POV c/o low back pain that started yesterday.  Pt denies any known injury or any difficulty urinating.  Pt ambulatory to triage with good gait and in NAD.

## 2020-06-07 ENCOUNTER — Telehealth: Payer: Self-pay

## 2020-06-07 LAB — URINE CULTURE

## 2020-06-07 NOTE — Telephone Encounter (Signed)
Left message to call back  

## 2020-06-10 ENCOUNTER — Other Ambulatory Visit: Payer: Self-pay

## 2020-06-13 ENCOUNTER — Inpatient Hospital Stay: Payer: No Typology Code available for payment source | Admitting: Adult Health

## 2020-06-18 ENCOUNTER — Other Ambulatory Visit: Payer: Self-pay

## 2020-06-18 ENCOUNTER — Ambulatory Visit (INDEPENDENT_AMBULATORY_CARE_PROVIDER_SITE_OTHER): Payer: No Typology Code available for payment source

## 2020-06-18 ENCOUNTER — Ambulatory Visit (INDEPENDENT_AMBULATORY_CARE_PROVIDER_SITE_OTHER): Payer: No Typology Code available for payment source | Admitting: Adult Health

## 2020-06-18 ENCOUNTER — Encounter: Payer: Self-pay | Admitting: Adult Health

## 2020-06-18 VITALS — BP 124/90 | HR 72 | Temp 97.7°F | Ht 62.99 in | Wt 235.8 lb

## 2020-06-18 DIAGNOSIS — M5442 Lumbago with sciatica, left side: Secondary | ICD-10-CM | POA: Diagnosis not present

## 2020-06-18 DIAGNOSIS — N390 Urinary tract infection, site not specified: Secondary | ICD-10-CM | POA: Insufficient documentation

## 2020-06-18 DIAGNOSIS — J309 Allergic rhinitis, unspecified: Secondary | ICD-10-CM

## 2020-06-18 DIAGNOSIS — E559 Vitamin D deficiency, unspecified: Secondary | ICD-10-CM | POA: Insufficient documentation

## 2020-06-18 DIAGNOSIS — F418 Other specified anxiety disorders: Secondary | ICD-10-CM

## 2020-06-18 DIAGNOSIS — K219 Gastro-esophageal reflux disease without esophagitis: Secondary | ICD-10-CM

## 2020-06-18 DIAGNOSIS — H6501 Acute serous otitis media, right ear: Secondary | ICD-10-CM

## 2020-06-18 DIAGNOSIS — I10 Essential (primary) hypertension: Secondary | ICD-10-CM

## 2020-06-18 LAB — CBC WITH DIFFERENTIAL/PLATELET
Basophils Absolute: 0 10*3/uL (ref 0.0–0.1)
Basophils Relative: 0.5 % (ref 0.0–3.0)
Eosinophils Absolute: 0.1 10*3/uL (ref 0.0–0.7)
Eosinophils Relative: 1.9 % (ref 0.0–5.0)
HCT: 40.5 % (ref 36.0–46.0)
Hemoglobin: 13.9 g/dL (ref 12.0–15.0)
Lymphocytes Relative: 43.1 % (ref 12.0–46.0)
Lymphs Abs: 2.7 10*3/uL (ref 0.7–4.0)
MCHC: 34.3 g/dL (ref 30.0–36.0)
MCV: 91.7 fl (ref 78.0–100.0)
Monocytes Absolute: 0.3 10*3/uL (ref 0.1–1.0)
Monocytes Relative: 5.2 % (ref 3.0–12.0)
Neutro Abs: 3.1 10*3/uL (ref 1.4–7.7)
Neutrophils Relative %: 49.3 % (ref 43.0–77.0)
Platelets: 235 10*3/uL (ref 150.0–400.0)
RBC: 4.42 Mil/uL (ref 3.87–5.11)
RDW: 13 % (ref 11.5–15.5)
WBC: 6.3 10*3/uL (ref 4.0–10.5)

## 2020-06-18 LAB — COMPREHENSIVE METABOLIC PANEL
ALT: 46 U/L — ABNORMAL HIGH (ref 0–35)
AST: 26 U/L (ref 0–37)
Albumin: 4.2 g/dL (ref 3.5–5.2)
Alkaline Phosphatase: 84 U/L (ref 39–117)
BUN: 16 mg/dL (ref 6–23)
CO2: 29 mEq/L (ref 19–32)
Calcium: 9.2 mg/dL (ref 8.4–10.5)
Chloride: 104 mEq/L (ref 96–112)
Creatinine, Ser: 0.71 mg/dL (ref 0.40–1.20)
GFR: 110.68 mL/min (ref >60.00)
Glucose, Bld: 101 mg/dL — ABNORMAL HIGH (ref 70–99)
Potassium: 4.6 mEq/L (ref 3.5–5.1)
Sodium: 138 mEq/L (ref 135–145)
Total Bilirubin: 0.5 mg/dL (ref 0.2–1.2)
Total Protein: 7 g/dL (ref 6.0–8.3)

## 2020-06-18 LAB — URINALYSIS, MICROSCOPIC ONLY: RBC / HPF: NONE SEEN (ref 0–?)

## 2020-06-18 LAB — VITAMIN D 25 HYDROXY (VIT D DEFICIENCY, FRACTURES): VITD: 24.91 ng/mL — ABNORMAL LOW (ref 30.00–100.00)

## 2020-06-18 MED ORDER — PREDNISONE 10 MG PO TABS
ORAL_TABLET | ORAL | 0 refills | Status: DC
Start: 2020-06-18 — End: 2022-04-28
  Filled 2020-06-18: qty 21, 6d supply, fill #0

## 2020-06-18 MED ORDER — LEVOCETIRIZINE DIHYDROCHLORIDE 5 MG PO TABS
5.0000 mg | ORAL_TABLET | Freq: Every evening | ORAL | 3 refills | Status: DC
  Filled 2020-06-18: qty 90, 90d supply, fill #0

## 2020-06-18 MED ORDER — PANTOPRAZOLE SODIUM 40 MG PO TBEC
40.0000 mg | DELAYED_RELEASE_TABLET | Freq: Every day | ORAL | 1 refills | Status: DC
  Filled 2020-06-18: qty 90, 90d supply, fill #0

## 2020-06-18 MED ORDER — ESCITALOPRAM OXALATE 5 MG PO TABS
ORAL_TABLET | Freq: Every day | ORAL | 1 refills | Status: DC
  Filled 2020-06-18: qty 90, 90d supply, fill #0

## 2020-06-18 NOTE — Progress Notes (Signed)
Acute Office Visit  Subjective:    Patient ID: Bonnie Shepard, female    DOB: 1985/10/17, 35 y.o.   MRN: [id number]  Chief Complaint  Patient presents with   Hospitalization Follow-up    Pt states shes been having reccurent UTIs and back pain. Pt states the UTI has been resolved with medication and she is not having any urinary symptoms but still having back pain.     HPI Patient is in today for follow up on recurrent Urinary tract infections with lower back pain.   Last urine culture 06/06/20 says multiple species present needs recollection.  Not done. Short course of keflex was given in emergency room.   She also has left sided lower back pain with left leg sciatica denies any injury she did do heavy lifting at work in past.  She has recently changed jobs that does not require lifting as before.  Denies any loss of bowel or bladder control.  Denies saddle paresthesias.  Denies radiculopathy/ paresthesias.   Potassium was 3.0 04/21/20.supplement was given.  [place] scheduled with GI RUQ with gastroenterology Dr. Servando Snare CT abdomen IMPRESSION: 1. No acute intra-abdominal or pelvic pathology. 2. Fatty liver. 3. Small scattered sigmoid diverticula. No bowel obstruction. Normal appendix. Electronically Signed   By: Elgie Collard M.D.   On: 01/17/2020 16:02    She feels well today.  Patient  denies any fever, body aches,chills, rash, chest pain, shortness of breath, nausea, vomiting, or diarrhea.  Denies dizziness, lightheadedness, pre syncopal or syncopal episodes.     Past Medical History:  Diagnosis Date   Acid reflux    Asthma    Family history of adverse reaction to anesthesia    Mother - PONV   Infection    freq UTI   Vaginal Pap smear, abnormal    f/u wnl   Wears contact lenses     Past Surgical History:  Procedure Laterality Date   COLONOSCOPY WITH PROPOFOL N/A 04/03/2019   Procedure: COLONOSCOPY WITH PROPOFOL;  Surgeon: Midge Minium, MD;  Location: [facility];  Service: Endoscopy;  Laterality: N/A;  Priority 3   DILATION AND EVACUATION N/A 09/07/2013   Procedure: DILATATION AND EVACUATION;  Surgeon: Freddrick March. Tenny Craw, MD;  Location: [facility];  Service: Gynecology;  Laterality: N/A;   ESOPHAGOGASTRODUODENOSCOPY (EGD) WITH PROPOFOL N/A 04/04/2020   Procedure: ESOPHAGOGASTRODUODENOSCOPY (EGD) WITH BIOPSY;  Surgeon: Midge Minium, MD;  Location: [facility];  Service: Endoscopy;  Laterality: N/A;   TONSILLECTOMY      Family History  Problem Relation Age of Onset   Hypertension Other    Cancer Other    Hypertension Mother    Hypertension Father    Hypertension Maternal Grandmother    Heart disease Maternal Grandmother    Hearing loss Neg Hx     Social History   Socioeconomic History   Marital status: Single    Spouse name: Not on file   Number of children: Not on file   Years of education: Not on file   Highest education level: Not on file  Occupational History   Not on file  Tobacco Use   Smoking status: Every Day    Packs/day: 0.25    Years: 16.00    Pack years: 4.00    Types: Cigarettes   Smokeless tobacco: Never   Tobacco comments:    since age 26  Vaping Use   Vaping Use: Never used  Substance and Sexual Activity   Alcohol use: No  Comment: occasionally   Drug use: No   Sexual activity: Yes    Birth control/protection: None  Other Topics Concern   Not on file  Social History Narrative   Not on file   Social Determinants of Health   Financial Resource Strain: Not on file  Food Insecurity: Not on file  Transportation Needs: Not on file  Physical Activity: Not on file  Stress: Not on file  Social Connections: Not on file  Intimate Partner Violence: Not on file    Outpatient Medications Prior to Visit  Medication Sig Dispense Refill   albuterol (VENTOLIN HFA) 108 (90 Base) MCG/ACT inhaler Inhale 2 puffs into the lungs every 4 (four) hours as needed.     Azelastine HCl 0.15 % SOLN PLACE 2 SPRAYS INTO THE  NOSE DAILY. (Patient not taking: Reported on 06/18/2020) 30 mL 0   fluticasone (FLONASE) 50 MCG/ACT nasal spray PLACE 2 SPRAYS INTO BOTH NOSTRILS DAILY. 16 g 6   methocarbamol (ROBAXIN) 500 MG tablet TAKE 1 TABLET BY MOUTH 3 TIMES A DAY BY MOUTH FOR 14 DAYS. (Patient not taking: Reported on 06/18/2020) 42 tablet 0   ondansetron (ZOFRAN-ODT) 8 MG disintegrating tablet DISSOLVE 1 TABLET BY MOUTH EVERY 8 HOURS AS NEEDED FOR UP TO 7 DAYS FOR NAUSEA OR VOMITING. 21 tablet 0   Vitamin D, Ergocalciferol, (DRISDOL) 1.25 MG (50000 UNIT) CAPS capsule TAKE 1 CAPSULE BY MOUTH EVERY 7 (SEVEN) DAYS. WALK IN LAB IN OFFICE 1-2 WEEKS AFTER COMPLETING PRESCRIPTION. (Patient not taking: Reported on 06/18/2020) 12 capsule 1   escitalopram (LEXAPRO) 5 MG tablet TAKE 1 TABLET BY MOUTH DAILY. 270 tablet 1   hydrochlorothiazide (HYDRODIURIL) 25 MG tablet TAKE 0.5 TABLETS BY MOUTH DAILY. (Patient not taking: No sig reported) 90 tablet 3   levocetirizine (XYZAL) 5 MG tablet TAKE 1 TABLET BY MOUTH EVERY EVENING. (Patient taking differently: Take 5 mg by mouth every evening.) 30 tablet 3   pantoprazole (PROTONIX) 40 MG tablet TAKE 1 TABLET BY MOUTH DAILY. (Patient taking differently: Take 40 mg by mouth 2 (two) times daily.) 90 tablet 3   No facility-administered medications prior to visit.    No Known Allergies  Review of Systems  Constitutional: Negative.   HENT: Negative.    Respiratory: Negative.    Cardiovascular: Negative.   Gastrointestinal: Negative.   Genitourinary: Negative.   Musculoskeletal:  Positive for back pain. Negative for arthralgias, gait problem, joint swelling, myalgias, neck pain and neck stiffness.  Skin: Negative.   Neurological:  Positive for numbness (sciatica left side back to posterior thigh stops at knee.). Negative for dizziness, tremors, seizures, syncope, facial asymmetry, speech difficulty, weakness, light-headedness and headaches.  Psychiatric/Behavioral: Negative.        Objective:     Physical Exam Vitals reviewed.  Constitutional:      General: She is not in acute distress.    Appearance: She is well-developed. She is obese. She is not ill-appearing, toxic-appearing or diaphoretic.     Interventions: She is not intubated.    Comments: Patient appers well, not sickly. Speaking in complete sentences. Patient moves on and off of exam table and in room without difficulty. Gait is normal in hall and in room. Patient is oriented to person place time and situation. Patient answers questions appropriately and engages eye contact and verbal dialect with provider.    HENT:     Head: Normocephalic and atraumatic.     Right Ear: External ear normal.     Left Ear: External ear  normal.     Nose: Nose normal.     Mouth/Throat:     Pharynx: No oropharyngeal exudate.  Eyes:     General: Lids are normal. No scleral icterus.       Right eye: No discharge.        Left eye: No discharge.     Conjunctiva/sclera: Conjunctivae normal.     Right eye: Right conjunctiva is not injected. No exudate or hemorrhage.    Left eye: Left conjunctiva is not injected. No exudate or hemorrhage.    Pupils: Pupils are equal, round, and reactive to light.  Neck:     Thyroid: No thyroid mass or thyromegaly.     Vascular: Normal carotid pulses. No carotid bruit, hepatojugular reflux or JVD.     Trachea: Trachea and phonation normal. No tracheal tenderness or tracheal deviation.     Meningeal: Brudzinski's sign and Kernig's sign absent.  Cardiovascular:     Rate and Rhythm: Normal rate and regular rhythm.     Pulses: Normal pulses.          Radial pulses are 2+ on the right side and 2+ on the left side.       Dorsalis pedis pulses are 2+ on the right side and 2+ on the left side.       Posterior tibial pulses are 2+ on the right side and 2+ on the left side.     Heart sounds: Normal heart sounds, S1 normal and S2 normal. Heart sounds not distant. No murmur heard.   No friction rub. No gallop.   Pulmonary:     Effort: Pulmonary effort is normal. No tachypnea, bradypnea, accessory muscle usage or respiratory distress. She is not intubated.     Breath sounds: Normal breath sounds. No stridor. No wheezing, rhonchi or rales.  Chest:     Chest wall: No tenderness.  Breasts:    Right: No supraclavicular adenopathy.     Left: No supraclavicular adenopathy.  Abdominal:     General: Bowel sounds are normal. There is no distension or abdominal bruit.     Palpations: Abdomen is soft. There is no shifting dullness, fluid wave, hepatomegaly, splenomegaly, mass or pulsatile mass.     Tenderness: There is no abdominal tenderness. There is no guarding or rebound.     Hernia: No hernia is present.  Musculoskeletal:        General: No tenderness or deformity. Normal range of motion.     Cervical back: Normal, full passive range of motion without pain, normal range of motion and neck supple. No edema, erythema or rigidity. No spinous process tenderness or muscular tenderness. Normal range of motion.     Thoracic back: Normal.     Lumbar back: Spasms present. No swelling, tenderness or bony tenderness. Normal range of motion.       Back:  Lymphadenopathy:     Head:     Right side of head: No submental, submandibular, tonsillar, preauricular, posterior auricular or occipital adenopathy.     Left side of head: No submental, submandibular, tonsillar, preauricular, posterior auricular or occipital adenopathy.     Cervical: No cervical adenopathy.     Right cervical: No superficial, deep or posterior cervical adenopathy.    Left cervical: No superficial, deep or posterior cervical adenopathy.     Upper Body:     Right upper body: No supraclavicular or pectoral adenopathy.     Left upper body: No supraclavicular or pectoral adenopathy.  Skin:  General: Skin is warm and dry.     Coloration: Skin is not pale.     Findings: No abrasion, bruising, burn, ecchymosis, erythema, lesion, petechiae or  rash.     Nails: There is no clubbing.  Neurological:     Mental Status: She is alert and oriented to person, place, and time.     GCS: GCS eye subscore is 4. GCS verbal subscore is 5. GCS motor subscore is 6.     Cranial Nerves: No cranial nerve deficit.     Sensory: No sensory deficit.     Motor: No tremor, atrophy, abnormal muscle tone or seizure activity.     Coordination: Coordination normal.     Gait: Gait normal.     Deep Tendon Reflexes: Reflexes are normal and symmetric. Reflexes normal. Babinski sign absent on the right side. Babinski sign absent on the left side.     Reflex Scores:      Tricep reflexes are 2+ on the right side and 2+ on the left side.      Bicep reflexes are 2+ on the right side and 2+ on the left side.      Brachioradialis reflexes are 2+ on the right side and 2+ on the left side.      Patellar reflexes are 2+ on the right side and 2+ on the left side.      Achilles reflexes are 2+ on the right side and 2+ on the left side. Psychiatric:        Speech: Speech normal.        Behavior: Behavior normal.        Thought Content: Thought content normal.        Judgment: Judgment normal.    BP 124/90   Pulse 72   Temp 97.7 F (36.5 C)   Ht 5' 2.99" (1.6 m)   Wt 235 lb 12.8 oz (107 kg)   LMP 06/08/2020   SpO2 97%   BMI 41.78 kg/m  Wt Readings from Last 3 Encounters:  06/18/20 235 lb 12.8 oz (107 kg)  06/06/20 235 lb (106.6 kg)  05/16/20 235 lb 12.8 oz (107 kg)    Health Maintenance Due  Topic Date Due   COVID-19 Vaccine (1) Never done   Pneumococcal Vaccine 220-35 Years old (1 - PCV) Never done   FOOT EXAM  Never done   URINE MICROALBUMIN  Never done   OPHTHALMOLOGY EXAM  06/12/2020    There are no preventive care reminders to display for this patient.   Lab Results  Component Value Date   TSH 0.605 04/20/2020   Lab Results  Component Value Date   WBC 12.0 (H) 04/22/2020   HGB 15.9 (H) 04/22/2020   HCT 46.1 (H) 04/22/2020   MCV 90.0  04/22/2020   PLT 322 04/22/2020   Lab Results  Component Value Date   NA 134 (L) 04/22/2020   K 3.4 (L) 04/22/2020   CO2 26 04/22/2020   GLUCOSE 114 (H) 04/22/2020   BUN 14 04/22/2020   CREATININE 0.75 04/22/2020   BILITOT 0.8 04/22/2020   ALKPHOS 77 04/22/2020   AST 36 04/22/2020   ALT 50 (H) 04/22/2020   PROT 8.0 04/22/2020   ALBUMIN 4.3 04/22/2020   CALCIUM 9.3 04/22/2020   ANIONGAP 11 04/22/2020   Lab Results  Component Value Date   CHOL 181 01/20/2019   Lab Results  Component Value Date   HDL 38 (L) 01/20/2019   Lab Results  Component Value Date  LDLCALC 126 (H) 01/20/2019   Lab Results  Component Value Date   TRIG 92 01/20/2019   No results found for: CHOLHDL Lab Results  Component Value Date   HGBA1C 5.4 01/17/2020       Assessment & Plan:   Problem List Items Addressed This Visit       Cardiovascular and Mediastinum   Hypertension     Respiratory   Allergic rhinitis   Relevant Medications   levocetirizine (XYZAL) 5 MG tablet     Digestive   GERD (gastroesophageal reflux disease)   Relevant Medications   pantoprazole (PROTONIX) 40 MG tablet     Nervous and Auditory   Non-recurrent acute serous otitis media of right ear   Relevant Medications   levocetirizine (XYZAL) 5 MG tablet   Acute left-sided low back pain with left-sided sciatica - Primary   Relevant Medications   escitalopram (LEXAPRO) 5 MG tablet   predniSONE (DELTASONE) 10 MG tablet   Other Relevant Orders   CBC with Differential/Platelet   Comprehensive metabolic panel   DG Lumbar Spine Complete     Genitourinary   Recurrent urinary tract infection   Relevant Orders   CULTURE, URINE COMPREHENSIVE   Urine Microscopic Only     Other   Anxiety with depression   Relevant Medications   escitalopram (LEXAPRO) 5 MG tablet   Vitamin D deficiency   Relevant Orders   VITAMIN D 25 Hydroxy (Vit-D Deficiency, Fractures)   Refills given.  Orders Placed This Encounter   Procedures   CULTURE, URINE COMPREHENSIVE   DG Lumbar Spine Complete    Order Specific Question:   Reason for Exam (SYMPTOM  OR DIAGNOSIS REQUIRED)    Answer:   left sciatica and lower back pain, for months.    Order Specific Question:   Is patient pregnant?    Answer:   No    Order Specific Question:   Preferred imaging location?    Answer:   [facility]   Urine Microscopic Only   CBC with Differential/Platelet   Comprehensive metabolic panel   VITAMIN D 25 Hydroxy (Vit-D Deficiency, Fractures)     Meds ordered this encounter  Medications   levocetirizine (XYZAL) 5 MG tablet    Sig: Take 1 tablet (5 mg total) by mouth every evening.    Dispense:  90 tablet    Refill:  3   pantoprazole (PROTONIX) 40 MG tablet    Sig: Take 1 tablet (40 mg total) by mouth daily.    Dispense:  90 tablet    Refill:  1   escitalopram (LEXAPRO) 5 MG tablet    Sig: TAKE 1 TABLET BY MOUTH DAILY.    Dispense:  270 tablet    Refill:  1   predniSONE (DELTASONE) 10 MG tablet    Sig: Take 6 tabs on day 1, 5 tabs on day 2, 4 tabs on day 3, 3 tabs on day 4, 2 tabs on day 5, 1 tab on day 6. Then stop.    Dispense:  21 tablet    Refill:  0    Prednisone pack given for back pain and sciatica x ray today. Consider orthopedic pending x ray. She has muscle relaxer PRN.  I am ok giving work note for back pain if needed,declined need now.  Will also check urine, offered referral to urology given recurrent Urinary infections. She would like to hold off at this time.   Red Flags discussed. The patient was given clear instructions to  go to ER or return to medical center if any red flags develop, symptoms do not improve, worsen or new problems develop. They verbalized understanding. Return in about 1 month (around 07/18/2020), or if symptoms worsen or fail to improve, for at any time for any worsening symptoms, Go to Emergency room/ urgent care if worse.   Jairo Ben, FNP

## 2020-06-18 NOTE — Patient Instructions (Signed)
Acute Back Pain, Adult Acute back pain is sudden and usually short-lived. It is often caused by an injury to the muscles and tissues in the back. The injury may result from: A muscle or ligament getting overstretched or torn (strained). Ligaments are tissues that connect bones to each other. Lifting something improperly can cause a back strain. Wear and tear (degeneration) of the spinal disks. Spinal disks are circular tissue that provide cushioning between the bones of the spine (vertebrae). Twisting motions, such as while playing sports or doing yard work. A hit to the back. Arthritis. You may have a physical exam, lab tests, and imaging tests to find the cause ofyour pain. Acute back pain usually goes away with rest and home care. Follow these instructions at home: Managing pain, stiffness, and swelling Treatment may include medicines for pain and inflammation that are taken by mouth or applied to the skin, prescription pain medicine, or muscle relaxants. Take over-the-counter and prescription medicines only as told by your health care provider. Your health care provider may recommend applying ice during the first 24-48 hours after your pain starts. To do this: Put ice in a plastic bag. Place a towel between your skin and the bag. Leave the ice on for 20 minutes, 2-3 times a day. If directed, apply heat to the affected area as often as told by your health care provider. Use the heat source that your health care provider recommends, such as a moist heat pack or a heating pad. Place a towel between your skin and the heat source. Leave the heat on for 20-30 minutes. Remove the heat if your skin turns bright red. This is especially important if you are unable to feel pain, heat, or cold. You have a greater risk of getting burned. Activity  Do not stay in bed. Staying in bed for more than 1-2 days can delay your recovery. Sit up and stand up straight. Avoid leaning forward when you sit or  hunching over when you stand. If you work at a desk, sit close to it so you do not need to lean over. Keep your chin tucked in. Keep your neck drawn back, and keep your elbows bent at a 90-degree angle (right angle). Sit high and close to the steering wheel when you drive. Add lower back (lumbar) support to your car seat, if needed. Take short walks on even surfaces as soon as you are able. Try to increase the length of time you walk each day. Do not sit, drive, or stand in one place for more than 30 minutes at a time. Sitting or standing for long periods of time can put stress on your back. Do not drive or use heavy machinery while taking prescription pain medicine. Use proper lifting techniques. When you bend and lift, use positions that put less stress on your back: Bend your knees. Keep the load close to your body. Avoid twisting. Exercise regularly as told by your health care provider. Exercising helps your back heal faster and helps prevent back injuries by keeping muscles strong and flexible. Work with a physical therapist to make a safe exercise program, as recommended by your health care provider. Do any exercises as told by your physical therapist.  Lifestyle Maintain a healthy weight. Extra weight puts stress on your back and makes it difficult to have good posture. Avoid activities or situations that make you feel anxious or stressed. Stress and anxiety increase muscle tension and can make back pain worse. Learn ways to manage   anxiety and stress, such as through exercise. General instructions Sleep on a firm mattress in a comfortable position. Try lying on your side with your knees slightly bent. If you lie on your back, put a pillow under your knees. Follow your treatment plan as told by your health care provider. This may include: Cognitive or behavioral therapy. Acupuncture or massage therapy. Meditation or yoga. Contact a health care provider if: You have pain that is not  relieved with rest or medicine. You have increasing pain going down into your legs or buttocks. Your pain does not improve after 2 weeks. You have pain at night. You lose weight without trying. You have a fever or chills. Get help right away if: You develop new bowel or bladder control problems. You have unusual weakness or numbness in your arms or legs. You develop nausea or vomiting. You develop abdominal pain. You feel faint. Summary Acute back pain is sudden and usually short-lived. Use proper lifting techniques. When you bend and lift, use positions that put less stress on your back. Take over-the-counter and prescription medicines and apply heat or ice as directed by your health care provider. This information is not intended to replace advice given to you by your health care provider. Make sure you discuss any questions you have with your healthcare provider. Document Revised: 09/12/2019 Document Reviewed: 09/15/2019 Elsevier Patient Education  2022 [employer] Inc. Prednisone tablets What is this medication? PREDNISONE (PRED ni sone) is a corticosteroid. It is commonly used to treat inflammation of the skin, joints, lungs, and other organs. Common conditions treated include asthma, allergies, and arthritis. It is also used for otherconditions, such as blood disorders and diseases of the adrenal glands. This medicine may be used for other purposes; ask your health care provider orpharmacist if you have questions. COMMON BRAND NAME(S): Deltasone, Predone, Sterapred, Sterapred DS What should I tell my care team before I take this medication? They need to know if you have any of these conditions: Cushing's syndrome diabetes glaucoma heart disease high blood pressure infection (especially a virus infection such as chickenpox, cold sores, or herpes) kidney disease liver disease mental illness myasthenia gravis osteoporosis seizures stomach or intestine problems thyroid  disease an unusual or allergic reaction to lactose, prednisone, other medicines, foods, dyes, or preservatives pregnant or trying to get pregnant breast-feeding How should I use this medication? Take this medicine by mouth with a glass of water. Follow the directions on the prescription label. Take this medicine with food. If you are taking this medicine once a day, take it in the morning. Do not take more medicine than you are told to take. Do not suddenly stop taking your medicine because you may develop a severe reaction. Your doctor will tell you how much medicine to take. If your doctor wants you to stop the medicine, the dose may be slowly loweredover time to avoid any side effects. Talk to your pediatrician regarding the use of this medicine in children.Special care may be needed. Overdosage: If you think you have taken too much of this medicine contact apoison control center or emergency room at once. NOTE: This medicine is only for you. Do not share this medicine with others. What if I miss a dose? If you miss a dose, take it as soon as you can. If it is almost time for your next dose, talk to your doctor or health care professional. You may need to miss a dose or take an extra dose. Do not take double or  extra doses withoutadvice. What may interact with this medication? Do not take this medicine with any of the following medications: metyrapone mifepristone This medicine may also interact with the following medications: aminoglutethimide amphotericin B aspirin and aspirin-like medicines barbiturates certain medicines for diabetes, like glipizide or glyburide cholestyramine cholinesterase inhibitors cyclosporine digoxin diuretics ephedrine female hormones, like estrogens and birth control pills isoniazid ketoconazole NSAIDS, medicines for pain and inflammation, like ibuprofen or naproxen phenytoin rifampin toxoids vaccines warfarin This list may not describe all possible  interactions. Give your health care provider a list of all the medicines, herbs, non-prescription drugs, or dietary supplements you use. Also tell them if you smoke, drink alcohol, or use illegaldrugs. Some items may interact with your medicine. What should I watch for while using this medication? Visit your doctor or health care professional for regular checks on your progress. If you are taking this medicine over a prolonged period, carry an identification card with your name and address, the type and dose of yourmedicine, and your doctor's name and address. This medicine may increase your risk of getting an infection. Tell your doctor or health care professional if you are around anyone with measles orchickenpox, or if you develop sores or blisters that do not heal properly. If you are going to have surgery, tell your doctor or health care professionalthat you have taken this medicine within the last twelve months. Ask your doctor or health care professional about your diet. You may need tolower the amount of salt you eat. This medicine may increase blood sugar. Ask your healthcare provider if changesin diet or medicines are needed if you have diabetes. What side effects may I notice from receiving this medication? Side effects that you should report to your doctor or health care professionalas soon as possible: allergic reactions like skin rash, itching or hives, swelling of the face, lips, or tongue changes in emotions or moods changes in vision depressed mood eye pain fever or chills, cough, sore throat, pain or difficulty passing urine signs and symptoms of high blood sugar such as being more thirsty or hungry or having to urinate more than normal. You may also feel very tired or have blurry vision. swelling of ankles, feet Side effects that usually do not require medical attention (report to yourdoctor or health care professional if they continue or are bothersome): confusion, excitement,  restlessness headache nausea, vomiting skin problems, acne, thin and shiny skin trouble sleeping weight gain This list may not describe all possible side effects. Call your doctor for medical advice about side effects. You may report side effects to FDA at1-800-FDA-1088. Where should I keep my medication? Keep out of the reach of children. Store at room temperature between 15 and 30 degrees C (59 and 86 degrees F). Protect from light. Keep container tightly closed. Throw away any unusedmedicine after the expiration date. NOTE: This sheet is a summary. It may not cover all possible information. If you have questions about this medicine, talk to your doctor, pharmacist, orhealth care provider.  2022 Elsevier/Gold Standard (2017-09-21 10:54:22) Acute Back Pain, Adult Acute back pain is sudden and usually short-lived. It is often caused by an injury to the muscles and tissues in the back. The injury may result from: A muscle or ligament getting overstretched or torn (strained). Ligaments are tissues that connect bones to each other. Lifting something improperly can cause a back strain. Wear and tear (degeneration) of the spinal disks. Spinal disks are circular tissue that provide cushioning between the bones of  the spine (vertebrae). Twisting motions, such as while playing sports or doing yard work. A hit to the back. Arthritis. You may have a physical exam, lab tests, and imaging tests to find the cause ofyour pain. Acute back pain usually goes away with rest and home care. Follow these instructions at home: Managing pain, stiffness, and swelling Treatment may include medicines for pain and inflammation that are taken by mouth or applied to the skin, prescription pain medicine, or muscle relaxants. Take over-the-counter and prescription medicines only as told by your health care provider. Your health care provider may recommend applying ice during the first 24-48 hours after your pain starts. To  do this: Put ice in a plastic bag. Place a towel between your skin and the bag. Leave the ice on for 20 minutes, 2-3 times a day. If directed, apply heat to the affected area as often as told by your health care provider. Use the heat source that your health care provider recommends, such as a moist heat pack or a heating pad. Place a towel between your skin and the heat source. Leave the heat on for 20-30 minutes. Remove the heat if your skin turns bright red. This is especially important if you are unable to feel pain, heat, or cold. You have a greater risk of getting burned. Activity  Do not stay in bed. Staying in bed for more than 1-2 days can delay your recovery. Sit up and stand up straight. Avoid leaning forward when you sit or hunching over when you stand. If you work at a desk, sit close to it so you do not need to lean over. Keep your chin tucked in. Keep your neck drawn back, and keep your elbows bent at a 90-degree angle (right angle). Sit high and close to the steering wheel when you drive. Add lower back (lumbar) support to your car seat, if needed. Take short walks on even surfaces as soon as you are able. Try to increase the length of time you walk each day. Do not sit, drive, or stand in one place for more than 30 minutes at a time. Sitting or standing for long periods of time can put stress on your back. Do not drive or use heavy machinery while taking prescription pain medicine. Use proper lifting techniques. When you bend and lift, use positions that put less stress on your back: [place] your knees. Keep the load close to your body. Avoid twisting. Exercise regularly as told by your health care provider. Exercising helps your back heal faster and helps prevent back injuries by keeping muscles strong and flexible. Work with a physical therapist to make a safe exercise program, as recommended by your health care provider. Do any exercises as told by your physical  therapist.  Lifestyle Maintain a healthy weight. Extra weight puts stress on your back and makes it difficult to have good posture. Avoid activities or situations that make you feel anxious or stressed. Stress and anxiety increase muscle tension and can make back pain worse. Learn ways to manage anxiety and stress, such as through exercise. General instructions Sleep on a firm mattress in a comfortable position. Try lying on your side with your knees slightly bent. If you lie on your back, put a pillow under your knees. Follow your treatment plan as told by your health care provider. This may include: Cognitive or behavioral therapy. Acupuncture or massage therapy. Meditation or yoga. Contact a health care provider if: You have pain that is not  relieved with rest or medicine. You have increasing pain going down into your legs or buttocks. Your pain does not improve after 2 weeks. You have pain at night. You lose weight without trying. You have a fever or chills. Get help right away if: You develop new bowel or bladder control problems. You have unusual weakness or numbness in your arms or legs. You develop nausea or vomiting. You develop abdominal pain. You feel faint. Summary Acute back pain is sudden and usually short-lived. Use proper lifting techniques. When you bend and lift, use positions that put less stress on your back. Take over-the-counter and prescription medicines and apply heat or ice as directed by your health care provider. This information is not intended to replace advice given to you by your health care provider. Make sure you discuss any questions you have with your healthcare provider. Document Revised: 09/12/2019 Document Reviewed: 09/15/2019 Elsevier Patient Education  2022 [employer]

## 2020-06-20 ENCOUNTER — Other Ambulatory Visit: Payer: Self-pay | Admitting: Internal Medicine

## 2020-06-20 DIAGNOSIS — R319 Hematuria, unspecified: Secondary | ICD-10-CM

## 2020-06-20 LAB — CULTURE, URINE COMPREHENSIVE
MICRO NUMBER:: 12005481
SPECIMEN QUALITY:: ADEQUATE

## 2020-06-20 NOTE — Progress Notes (Signed)
Vitamin  D is low, this can contribute to poor sleep and fatigue, will send in prescription for Vitamin D at 50,000 units by mouth once every 7 days/(once weekly) for 12 weeks. Advise recheck lab Vitamin D in 1-2 weeks after completing vitamin d prescription. Labs need to be scheduled.  ALT still mild elevation but improved. CBC is within normal limits. Urine culture still pending.

## 2020-06-20 NOTE — Progress Notes (Signed)
Lumbar spine x ray shows no acute findings.

## 2020-06-21 ENCOUNTER — Telehealth: Payer: Self-pay

## 2020-06-21 ENCOUNTER — Other Ambulatory Visit: Payer: Self-pay | Admitting: Adult Health

## 2020-06-21 ENCOUNTER — Other Ambulatory Visit: Payer: Self-pay

## 2020-06-21 DIAGNOSIS — Z1321 Encounter for screening for nutritional disorder: Secondary | ICD-10-CM

## 2020-06-21 DIAGNOSIS — N390 Urinary tract infection, site not specified: Secondary | ICD-10-CM

## 2020-06-21 DIAGNOSIS — E559 Vitamin D deficiency, unspecified: Secondary | ICD-10-CM

## 2020-06-21 MED ORDER — VITAMIN D (ERGOCALCIFEROL) 1.25 MG (50000 UNIT) PO CAPS
50000.0000 [IU] | ORAL_CAPSULE | ORAL | 1 refills | Status: DC
Start: 2020-06-21 — End: 2020-06-26
  Filled 2020-06-21: qty 12, 84d supply, fill #0

## 2020-06-21 MED ORDER — CEPHALEXIN 500 MG PO CAPS
500.0000 mg | ORAL_CAPSULE | Freq: Four times a day (QID) | ORAL | 0 refills | Status: DC
  Filled 2020-06-21: qty 28, 7d supply, fill #0

## 2020-06-21 NOTE — Progress Notes (Signed)
Meds ordered this encounter  Medications   Vitamin D, Ergocalciferol, (DRISDOL) 1.25 MG (50000 UNIT) CAPS capsule    Sig: Take 1 capsule (50,000 Units total) by mouth every 7 (seven) days for 24 doses.    Dispense:  12 capsule    Refill:  1

## 2020-06-21 NOTE — Progress Notes (Signed)
Gram positive cocci  in urine may be contamination. Will send in Keflex to be sure covered. Return if any symptoms change or worsen at anytime.

## 2020-06-21 NOTE — Telephone Encounter (Signed)
Vitamin D labs have been ordered for future lab visit.

## 2020-06-21 NOTE — Progress Notes (Signed)
Meds ordered this encounter  Medications   cephALEXin (KEFLEX) 500 MG capsule    Sig: Take 1 capsule (500 mg total) by mouth 4 (four) times daily.    Dispense:  28 capsule    Refill:  0

## 2020-06-26 ENCOUNTER — Other Ambulatory Visit: Payer: Self-pay | Admitting: Adult Health

## 2020-06-26 ENCOUNTER — Other Ambulatory Visit: Payer: Self-pay

## 2020-06-26 DIAGNOSIS — E559 Vitamin D deficiency, unspecified: Secondary | ICD-10-CM

## 2020-06-26 MED ORDER — VITAMIN D (ERGOCALCIFEROL) 1.25 MG (50000 UNIT) PO CAPS
50000.0000 [IU] | ORAL_CAPSULE | ORAL | 1 refills | Status: AC
  Filled 2020-06-26: qty 12, 84d supply, fill #0

## 2020-06-27 ENCOUNTER — Ambulatory Visit: Payer: No Typology Code available for payment source

## 2020-06-27 ENCOUNTER — Ambulatory Visit
Admission: RE | Admit: 2020-06-27 | Discharge: 2020-06-27 | Disposition: A | Discharge status: Home / Self Care | Payer: No Typology Code available for payment source | Source: Ambulatory Visit | Attending: Gastroenterology | Admitting: Gastroenterology

## 2020-06-27 ENCOUNTER — Other Ambulatory Visit: Payer: Self-pay

## 2020-06-27 DIAGNOSIS — R112 Nausea with vomiting, unspecified: Secondary | ICD-10-CM | POA: Diagnosis not present

## 2020-07-02 ENCOUNTER — Telehealth: Payer: Self-pay

## 2020-07-02 ENCOUNTER — Encounter: Payer: Self-pay | Admitting: Adult Health

## 2020-07-02 NOTE — Telephone Encounter (Signed)
-----   Message from Midge Minium, MD sent at 07/01/2020  1:00 PM EDT ----- Let the patient know that the ultrasound of her gallbladder did not show any stones and her liver showed fatty liver.  This can be managed by weight loss of 7% of total body weight.

## 2020-07-02 NOTE — Telephone Encounter (Signed)
Pt notified of results through my chart

## 2020-07-22 ENCOUNTER — Ambulatory Visit: Payer: No Typology Code available for payment source | Admitting: Adult Health

## 2021-01-12 ENCOUNTER — Encounter: Payer: Self-pay | Admitting: Emergency Medicine

## 2021-01-12 ENCOUNTER — Other Ambulatory Visit: Payer: Self-pay

## 2021-01-12 ENCOUNTER — Emergency Department
Admission: EM | Admit: 2021-01-12 | Discharge: 2021-01-12 | Disposition: A | Discharge status: Home / Self Care | Payer: No Typology Code available for payment source | Attending: Emergency Medicine | Admitting: Emergency Medicine

## 2021-01-12 DIAGNOSIS — H18822 Corneal disorder due to contact lens, left eye: Secondary | ICD-10-CM | POA: Diagnosis not present

## 2021-01-12 DIAGNOSIS — S0502XA Injury of conjunctiva and corneal abrasion without foreign body, left eye, initial encounter: Secondary | ICD-10-CM | POA: Diagnosis not present

## 2021-01-12 DIAGNOSIS — S0592XA Unspecified injury of left eye and orbit, initial encounter: Secondary | ICD-10-CM | POA: Diagnosis present

## 2021-01-12 DIAGNOSIS — W19XXXA Unspecified fall, initial encounter: Secondary | ICD-10-CM | POA: Diagnosis not present

## 2021-01-12 DIAGNOSIS — H5789 Other specified disorders of eye and adnexa: Secondary | ICD-10-CM

## 2021-01-12 MED ORDER — FLUORESCEIN SODIUM 1 MG OP STRP
2.0000 | ORAL_STRIP | Freq: Once | OPHTHALMIC | Status: DC
  Filled 2021-01-12: qty 2

## 2021-01-12 MED ORDER — TETRACAINE HCL 0.5 % OP SOLN
2.0000 [drp] | Freq: Once | OPHTHALMIC | Status: DC
  Filled 2021-01-12: qty 4

## 2021-01-12 MED ORDER — ERYTHROMYCIN 5 MG/GM OP OINT: 1.0000 "application " | TOPICAL_OINTMENT | Freq: Four times a day (QID) | OPHTHALMIC | 1 refills | Status: AC

## 2021-01-12 MED ORDER — KETOROLAC TROMETHAMINE 0.5 % OP SOLN: 1.0000 [drp] | Freq: Four times a day (QID) | OPHTHALMIC | 0 refills | Status: DC

## 2021-01-12 NOTE — ED Notes (Signed)
Pt d/c by EDP.

## 2021-01-12 NOTE — ED Provider Notes (Signed)
[facility] Provider Note    Event Date/Time   First MD Initiated Contact with Patient 01/12/21 [phone]     (approximate)   History   Eye Pain   HPI  Bonnie Shepard is a 36 y.o. female presenting to the emergency department for treatment and evaluation of bilateral eye pain.  She states that she fell asleep with her contacts in and when she woke up her eyes or irritated and red.  She took her contacts out but symptoms have continued.  No alleviating measures attempted prior to arrival.  Patient is slightly blurry even with her glasses on, but she is still able to read.      Physical Exam   Triage Vital Signs: ED Triage Vitals [01/12/21 0723]  Enc Vitals Group     BP (!) 142/81     Pulse Rate 87     Resp 16     Temp 98.1 F (36.7 C)     Temp Source Oral     SpO2 97 %     Weight 230 lb (104.3 kg)     Height 5\' 3"  (1.6 m)     Head Circumference      Peak Flow      Pain Score 7     Pain Loc      Pain Edu?      Excl. in GC?     Most recent vital signs: Vitals:   01/12/21 0723  BP: (!) 142/81  Pulse: 87  Resp: 16  Temp: 98.1 F (36.7 C)  SpO2: 97%    General: Awake, no distress.  CV:  Good peripheral perfusion.  Resp:  Normal effort.  Abd:  No distention.  Other:  Bilateral eye erythema.  Fluorescein stain exam shows punctate areas over the left, no dye uptake on the right.   ED Results / Procedures / Treatments   Labs (all labs ordered are listed, but only abnormal results are displayed) Labs Reviewed - No data to display   EKG  Not indicated.   RADIOLOGY Not indicated.   PROCEDURES:  Critical Care performed: No  Procedures   MEDICATIONS ORDERED IN ED: Medications  fluorescein ophthalmic strip 2 strip (has no administration in time range)  tetracaine (PONTOCAINE) 0.5 % ophthalmic solution 2 drop (has no administration in time range)     IMPRESSION / MDM / ASSESSMENT AND PLAN / ED COURSE  I reviewed the triage  vital signs and the nursing notes.                              Differential diagnosis includes, but is not limited to, eye irritation, corneal abrasion, keratitis  Plan will be to discharge home with with erythromycin ointment and ketorolac drops.  She was advised that if she is not better within the next 2 to 3 days, she needs to be evaluated by her primary care provider.  She was also advised to throw away the contact lenses that she was wearing last night and not to wear contacts until her symptoms have completely improved.  Patient feels that she will be able to follow-up with her ophthalmologist if needed.  She was encouraged to return to the emergency department for symptoms of change or worsen if she is unable to schedule an appointment.      FINAL CLINICAL IMPRESSION(S) / ED DIAGNOSES   Final diagnoses:  Eye irritation  Corneal abrasion of left eye  due to contact lens     Rx / DC Orders   ED Discharge Orders          Ordered    erythromycin ophthalmic ointment  4 times daily        01/12/21 0848    ketorolac (ACULAR) 0.5 % ophthalmic solution  4 times daily        01/12/21 0848             Note:  This document was prepared using Dragon voice recognition software and may include unintentional dictation errors.   Chinita Pester, FNP 01/12/21 6734    Merwyn Katos, MD 01/12/21 [phone]

## 2021-01-12 NOTE — ED Triage Notes (Signed)
Pt to ED via POV c/o Bilateral eye pain and redness. Pt states that she fell asleep with her contacts in and woke up unable to open her eyes. Pt states that eyes are burning, eyes are red and watering. Pt is in NAD.

## 2021-04-16 IMAGING — DX DG CHEST 1V PORT
1 series · 1 of 1 positions shown · non-contrast
Comparison: None.

CLINICAL DATA: Shortness of breath and fatigue

EXAM:
PORTABLE CHEST 1 VIEW

[chest ap]
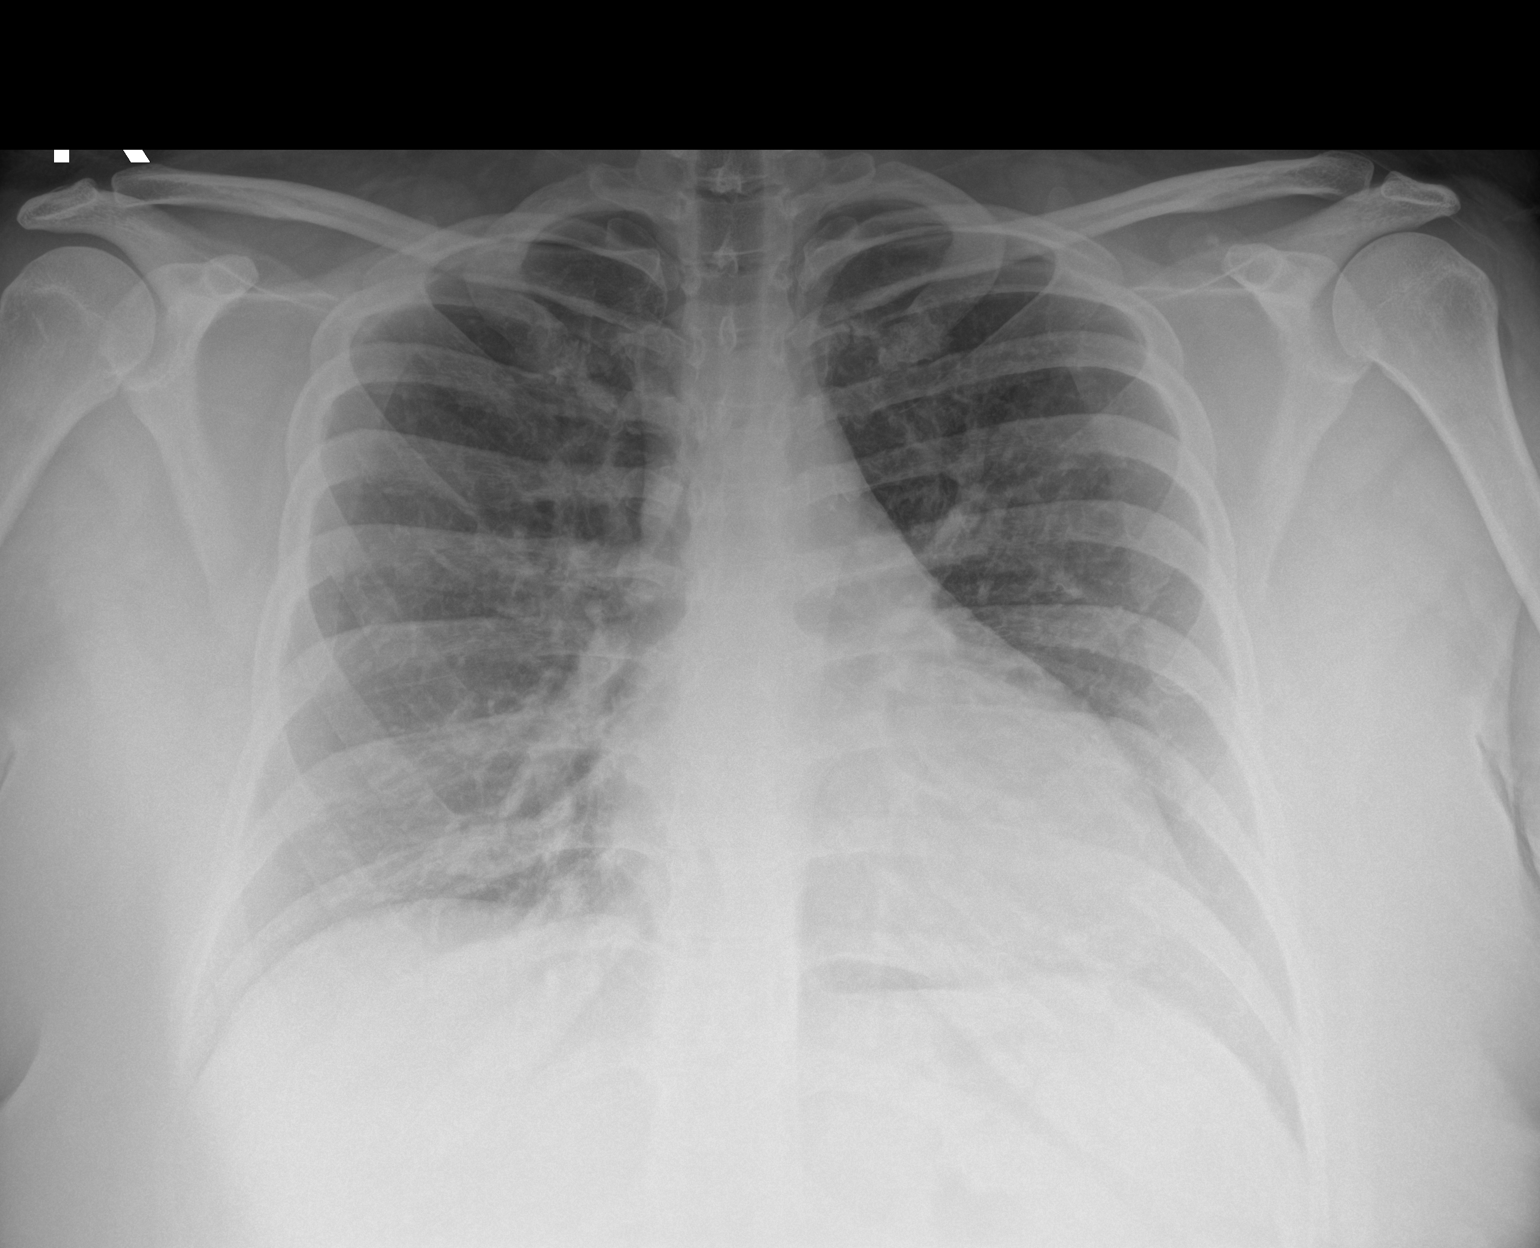

[1 of 1 positions shown; findings below may reference images not displayed]

FINDINGS: The lungs are clear. Heart is upper normal in size with pulmonary
vascularity normal. No adenopathy. No bone lesions.
IMPRESSION: Lungs clear.  Heart upper normal in size.

## 2021-05-13 ENCOUNTER — Other Ambulatory Visit: Payer: Self-pay

## 2021-05-13 DIAGNOSIS — Z021 Encounter for pre-employment examination: Secondary | ICD-10-CM

## 2021-05-13 NOTE — Progress Notes (Signed)
Presents to [facility] for pre-employment drug screen.  Rec - PT - Ride Attendant. ? ?[facility] Acct #:  [id number] ?[facility] Specimen #:  [id number] ? ?Rapid Drug Screen Results = Negative ? ?AMD ?

## 2021-08-07 ENCOUNTER — Emergency Department (HOSPITAL_COMMUNITY)
Admission: EM | Admit: 2021-08-07 | Discharge: 2021-08-07 | Discharge status: Against Medical Advice | Payer: No Typology Code available for payment source | Attending: Emergency Medicine | Admitting: Emergency Medicine

## 2021-08-07 ENCOUNTER — Encounter (HOSPITAL_COMMUNITY): Payer: Self-pay

## 2021-08-07 ENCOUNTER — Encounter: Payer: Self-pay | Admitting: Intensive Care

## 2021-08-07 ENCOUNTER — Emergency Department
Admission: EM | Admit: 2021-08-07 | Discharge: 2021-08-07 | Disposition: A | Discharge status: Home / Self Care | Payer: Self-pay | Attending: Emergency Medicine | Admitting: Emergency Medicine

## 2021-08-07 ENCOUNTER — Other Ambulatory Visit: Payer: Self-pay

## 2021-08-07 DIAGNOSIS — K649 Unspecified hemorrhoids: Secondary | ICD-10-CM | POA: Insufficient documentation

## 2021-08-07 DIAGNOSIS — K625 Hemorrhage of anus and rectum: Secondary | ICD-10-CM

## 2021-08-07 DIAGNOSIS — R300 Dysuria: Secondary | ICD-10-CM | POA: Insufficient documentation

## 2021-08-07 DIAGNOSIS — Z5321 Procedure and treatment not carried out due to patient leaving prior to being seen by health care provider: Secondary | ICD-10-CM | POA: Insufficient documentation

## 2021-08-07 HISTORY — DX: Unspecified hemorrhoids: K64.9

## 2021-08-07 LAB — CBC WITH DIFFERENTIAL/PLATELET
Abs Immature Granulocytes: 0.03 10*3/uL (ref 0.00–0.07)
Basophils Absolute: 0.1 10*3/uL (ref 0.0–0.1)
Basophils Relative: 1 %
Eosinophils Absolute: 0.1 10*3/uL (ref 0.0–0.5)
Eosinophils Relative: 1 %
HCT: 40.9 % (ref 36.0–46.0)
Hemoglobin: 13.6 g/dL (ref 12.0–15.0)
Immature Granulocytes: 0 %
Lymphocytes Relative: 41 %
Lymphs Abs: 3.6 10*3/uL (ref 0.7–4.0)
MCH: 30.2 pg (ref 26.0–34.0)
MCHC: 33.3 g/dL (ref 30.0–36.0)
MCV: 90.9 fL (ref 80.0–100.0)
Monocytes Absolute: 0.4 10*3/uL (ref 0.1–1.0)
Monocytes Relative: 4 %
Neutro Abs: 4.7 10*3/uL (ref 1.7–7.7)
Neutrophils Relative %: 53 %
Platelets: 238 10*3/uL (ref 150–400)
RBC: 4.5 MIL/uL (ref 3.87–5.11)
RDW: 12.1 % (ref 11.5–15.5)
WBC: 8.9 10*3/uL (ref 4.0–10.5)
nRBC: 0 % (ref 0.0–0.2)

## 2021-08-07 LAB — COMPREHENSIVE METABOLIC PANEL
ALT: 65 U/L — ABNORMAL HIGH (ref 0–44)
AST: 37 U/L (ref 15–41)
Albumin: 4 g/dL (ref 3.5–5.0)
Alkaline Phosphatase: 85 U/L (ref 38–126)
Anion gap: 8 (ref 5–15)
BUN: 11 mg/dL (ref 6–20)
CO2: 27 mmol/L (ref 22–32)
Calcium: 8.9 mg/dL (ref 8.9–10.3)
Chloride: 105 mmol/L (ref 98–111)
Creatinine, Ser: 0.73 mg/dL (ref 0.44–1.00)
GFR, Estimated: >60 mL/min (ref >60)
Glucose, Bld: 120 mg/dL — ABNORMAL HIGH (ref 70–99)
Potassium: 3.8 mmol/L (ref 3.5–5.1)
Sodium: 140 mmol/L (ref 135–145)
Total Bilirubin: 0.4 mg/dL (ref 0.3–1.2)
Total Protein: 7.1 g/dL (ref 6.5–8.1)

## 2021-08-07 LAB — POC URINE PREG, ED: Preg Test, Ur: NEGATIVE

## 2021-08-07 LAB — URINALYSIS, ROUTINE W REFLEX MICROSCOPIC
Bilirubin Urine: NEGATIVE
Glucose, UA: NEGATIVE mg/dL
Ketones, ur: NEGATIVE mg/dL
Leukocytes,Ua: NEGATIVE
Nitrite: NEGATIVE
Protein, ur: NEGATIVE mg/dL
Specific Gravity, Urine: 1.018 (ref 1.005–1.030)
pH: 6 (ref 5.0–8.0)

## 2021-08-07 LAB — TROPONIN I (HIGH SENSITIVITY)
Troponin I (High Sensitivity): 7 ng/L (ref <18)
Troponin I (High Sensitivity): 8 ng/L (ref <18)

## 2021-08-07 MED ORDER — HYDROCORTISONE ACETATE 25 MG RE SUPP
25.0000 mg | Freq: Two times a day (BID) | RECTAL | 0 refills | Status: AC
  Filled 2021-08-07: qty 12, 6d supply, fill #0

## 2021-08-07 MED ORDER — DIBUCAINE (PERIANAL) 1 % EX OINT
1.0000 | TOPICAL_OINTMENT | Freq: Three times a day (TID) | CUTANEOUS | 0 refills | Status: DC | PRN
  Filled 2021-08-07: qty 28, 7d supply, fill #0

## 2021-08-07 NOTE — ED Provider Notes (Signed)
[facility] Emergency Department Provider Note     Event Date/Time   First MD Initiated Contact with Patient 08/07/21 1701     (approximate)   History   Rectal Bleeding   HPI  Bonnie Shepard is a 36 y.o. female history of hemorrhoids, presents to the ED for evaluation of bright red blood per rectum since last Thursday.  Patient reports an episode of blood clots with a bowel movement today.  She denies any urinary symptoms, fever, chills, or sweats.     Physical Exam   Triage Vital Signs: ED Triage Vitals  Enc Vitals Group     BP 08/07/21 1616 (!) 163/89     Pulse Rate 08/07/21 1616 (!) 46     Resp 08/07/21 1616 16     Temp 08/07/21 1616 98 F (36.7 C)     Temp Source 08/07/21 1616 Oral     SpO2 08/07/21 1616 98 %     Weight 08/07/21 1617 235 lb (106.6 kg)     Height 08/07/21 1617 5\' 3"  (1.6 m)     Head Circumference --      Peak Flow --      Pain Score 08/07/21 1617 0     Pain Loc --      Pain Edu? --      Excl. in GC? --     Most recent vital signs: Vitals:   08/07/21 1826 08/07/21 1914  BP:  (!) 164/91  Pulse: (!) 58 66  Resp: 16 15  Temp: 98.5 F (36.9 C) 98.3 F (36.8 C)  SpO2: 98% 99%    General Awake, no distress. NAD CV:  Good peripheral perfusion. Variable heart rate. Normal S1S2. No murmurs, rubs, gallops RESP:  Normal effort.  ABD:  No distention. Normal rectal tone. No gross bleeding. No external/internal hemorrhoids noted   ED Results / Procedures / Treatments   Labs (all labs ordered are listed, but only abnormal results are displayed) Labs Reviewed  COMPREHENSIVE METABOLIC PANEL - Abnormal; Notable for the following components:      Result Value   Glucose, Bld 120 (*)    ALT 65 (*)    All other components within normal limits  URINALYSIS, ROUTINE W REFLEX MICROSCOPIC - Abnormal; Notable for the following components:   Color, Urine YELLOW (*)    APPearance HAZY (*)    Hgb urine dipstick MODERATE (*)     Bacteria, UA RARE (*)    All other components within normal limits  CBC WITH DIFFERENTIAL/PLATELET  POC URINE PREG, ED  TROPONIN I (HIGH SENSITIVITY)  TROPONIN I (HIGH SENSITIVITY)     EKG  Vent. rate 56 BPM PR interval 158 ms QRS duration 92 ms QT/QTcB 444/428 ms P-R-T axes 47 32 34  RADIOLOGY  No results found.   PROCEDURES:  Critical Care performed: No  Procedures   MEDICATIONS ORDERED IN ED: Medications - No data to display   IMPRESSION / MDM / ASSESSMENT AND PLAN / ED COURSE  I reviewed the triage vital signs and the nursing notes.                              Differential diagnosis includes, but is not limited to, hemorrhoids, anal fissures, lower GI bleed  Patient's presentation is most consistent with acute complicated illness / injury requiring diagnostic workup.  Patient's diagnosis is consistent with rectal bleeding.  Patient with a history of  hemorrhoids status post ligation procedure, presents with some intermittent rectal bleeding with passage of stool.  Patient presents in no acute distress denying abdominal discomfort.  Reassuring work-up overall without signs of acute critical anemia or leukocytosis.  Patient was found to have a bradycardic rate, but without any symptoms at this time.  Reassuring work-up with troponin negative x2 and no evidence of a malignant arrhythmia on EKG.  Patient will be discharged home with prescriptions for Anusol suppositories and Dibucaine topical solution. Patient is to follow up with both GI and cardiology as needed or otherwise directed. Patient is given ED precautions to return to the ED for any worsening or new symptoms.     FINAL CLINICAL IMPRESSION(S) / ED DIAGNOSES   Final diagnoses:  Rectal bleeding  Hemorrhoids, unspecified hemorrhoid type     Rx / DC Orders   ED Discharge Orders          Ordered    hydrocortisone (ANUSOL-HC) 25 MG suppository  Every 12 hours        08/07/21 1857    dibucaine  (NUPERCAINAL) 1 % OINT  3 times daily PRN        08/07/21 1857             Note:  This document was prepared using Dragon voice recognition software and may include unintentional dictation errors.    Lissa Hoard, PA-C 08/08/21 0018    Sharman Cheek, MD 08/08/21 1921

## 2021-08-07 NOTE — ED Notes (Signed)
Called from [facility] ED stating that patient needed to be taken off ED trackboard as they left and is currently in their ED to be seen.

## 2021-08-07 NOTE — Discharge Instructions (Addendum)
Your exam, labs, and EKG all normal and reassuring.  You do have evidence of a variable heart rate with bradycardia.  You should follow-up with cardiology for further evaluation.  No evidence of anemia or infection based on routine labs.  Use the cortisone suppositories and lidocaine gel for relief of pain to the hemorrhoids.  Follow-up with GI medicine for further evaluation management.

## 2021-08-07 NOTE — ED Provider Triage Note (Signed)
Emergency Medicine Provider Triage Evaluation Note  Bonnie Shepard , a 36 y.o. female  was evaluated in triage.  Pt complains of bright red blood per rectum.  Patient states that she 3 days of rectal bleeding.  She states it is painless in nature.  She notes "gross amounts of blood in the toilet as well as blood clots with bowel movements."  She denies any overt rectal or abdominal pain.  She has a history of diverticulosis confirmed on colonoscopy of last year.  She denies any nausea vomiting, fever, chills, night sweats, vaginal discharge/bleeding.  She notes some dysuria today but denies hematuria.  She has a history of hemorrhoids but states this bleeding is significantly more than how her hemorrhoids have blood in the past..  Review of Systems  Positive: See above Negative:   Physical Exam  BP (!) 165/106 (BP Location: Right Arm)   Pulse 86   Temp 97.9 F (36.6 C) (Oral)   Resp 18   LMP 07/23/2021   SpO2 94%  Gen:   Awake, no distress   Resp:  Normal effort  MSK:   Moves extremities without difficulty  Other:  No abdominal tenderness.  Medical Decision Making  Medically screening exam initiated at 10:00 AM.  Appropriate orders placed.  Bonnie Shepard was informed that the remainder of the evaluation will be completed by another provider, this initial triage assessment does not replace that evaluation, and the importance of remaining in the ED until their evaluation is complete.     Bonnie Shepard, [place] 08/07/21 1002

## 2021-08-07 NOTE — ED Triage Notes (Signed)
Pt reports rectal bleeding since last Thursday, hx of hemorrhoids but states she has been seeing a lot of clots with her bowel movements. Pt also reports nausea and burning with urination but denies abd pain.

## 2021-08-07 NOTE — ED Triage Notes (Signed)
Patient c/o rectal bleeding since last Thursday. Reports she had an episode of clots with bowel movement today. Hx hemorrhoids but never experienced clots. Denies urinary symptoms

## 2021-08-07 NOTE — ED Notes (Signed)
35 yom with a c/c of rectal bleeding since Thursday. The pt advised she had been passing clots today and was concerned so she came in. The pt advised she had just finished her period.

## 2021-08-08 ENCOUNTER — Other Ambulatory Visit: Payer: Self-pay

## 2021-09-25 IMAGING — US US ABDOMEN LIMITED
1 series · 15 of 25 positions shown · non-contrast
Comparison: CT 01/17/2020

CLINICAL DATA: Right upper quadrant pain

EXAM:
ULTRASOUND ABDOMEN LIMITED RIGHT UPPER QUADRANT

[Series 1: us abdomen limited ruq · 15 of 53 slices shown]
[im 1/53]
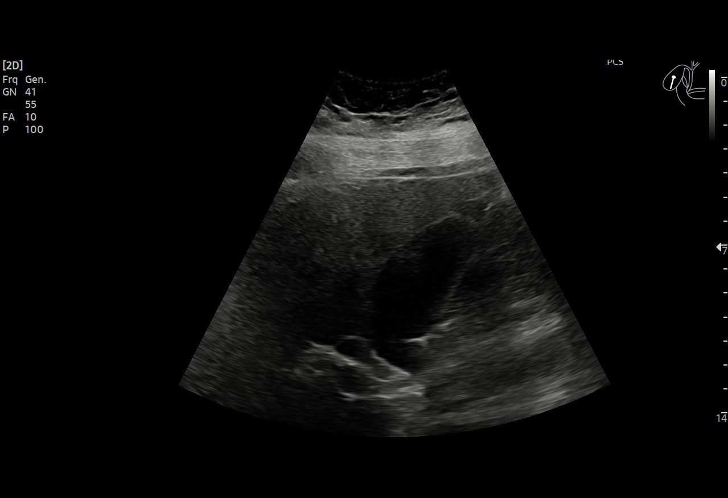
[im 5/53]
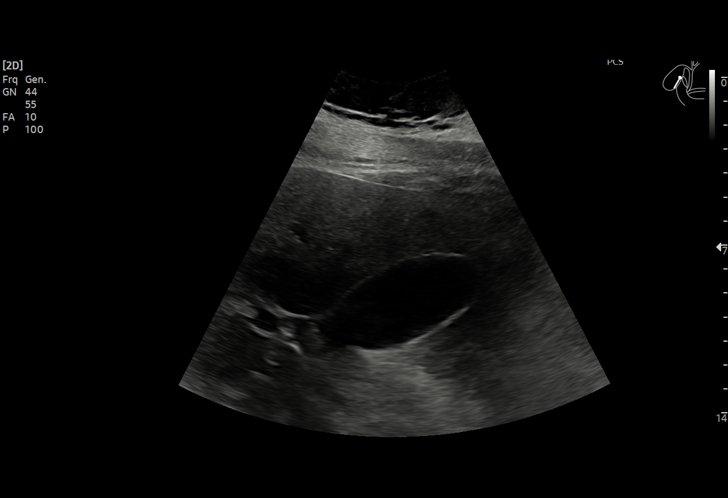
[im 9/53]
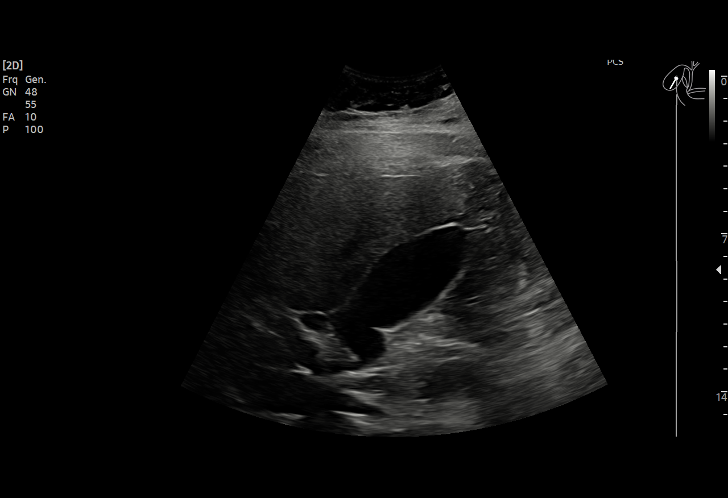
[im 11/53]
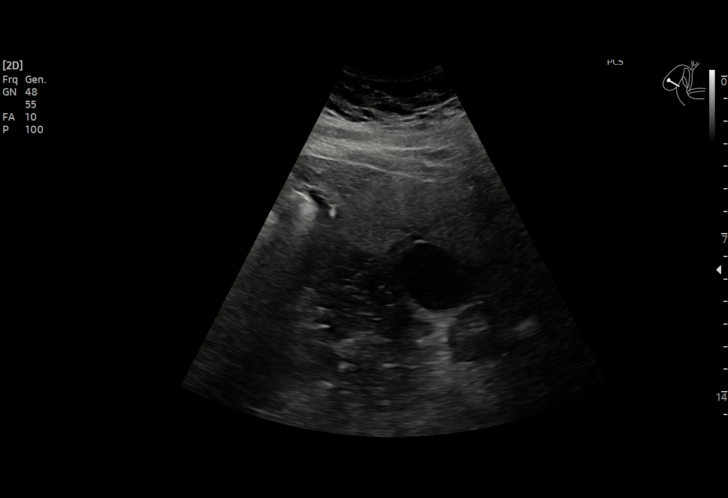
[im 16/53]
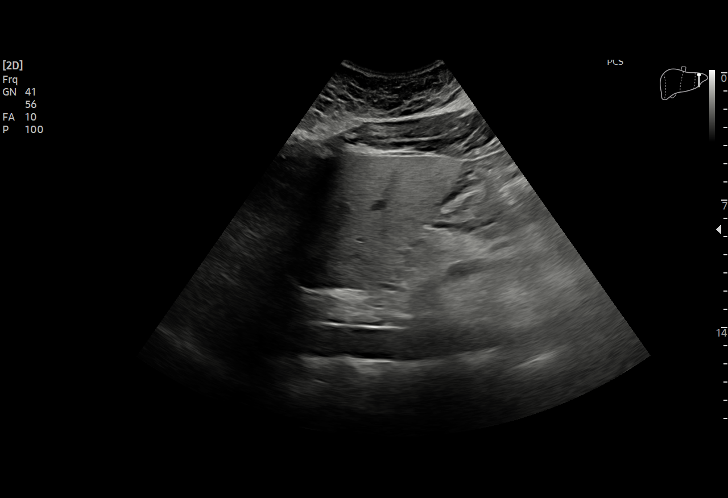
[im 20/53]
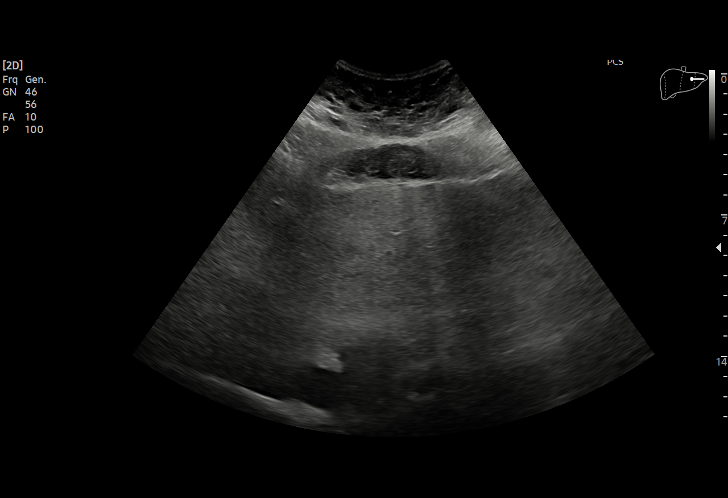
[im 22/53]
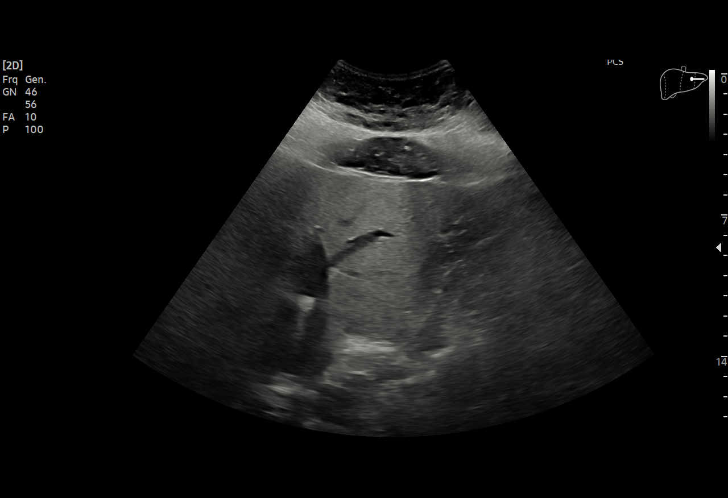
[im 27/53]
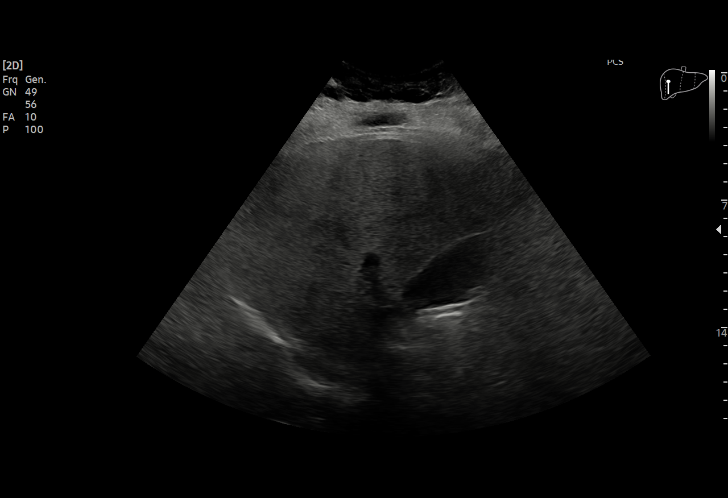
[im 31/53]
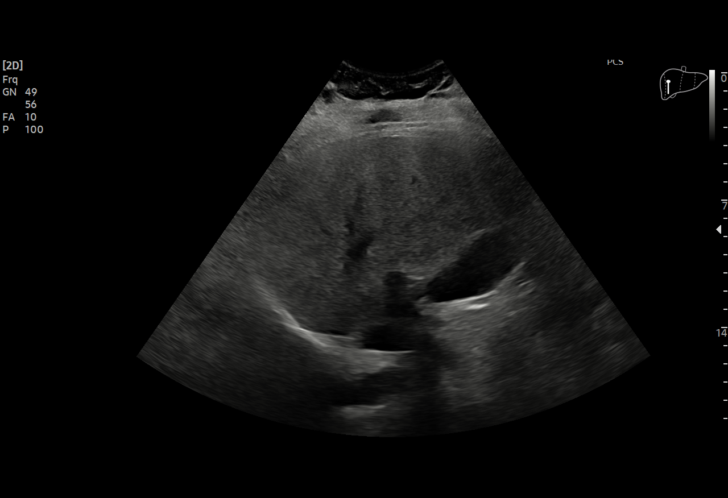
[im 33/53]
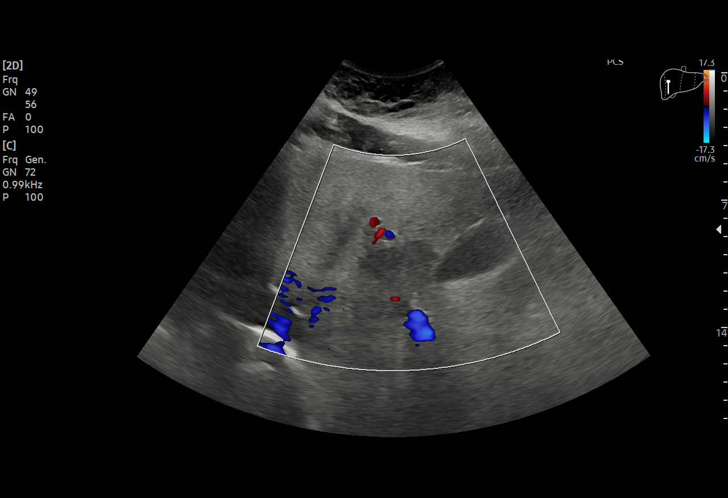
[im 37/53]
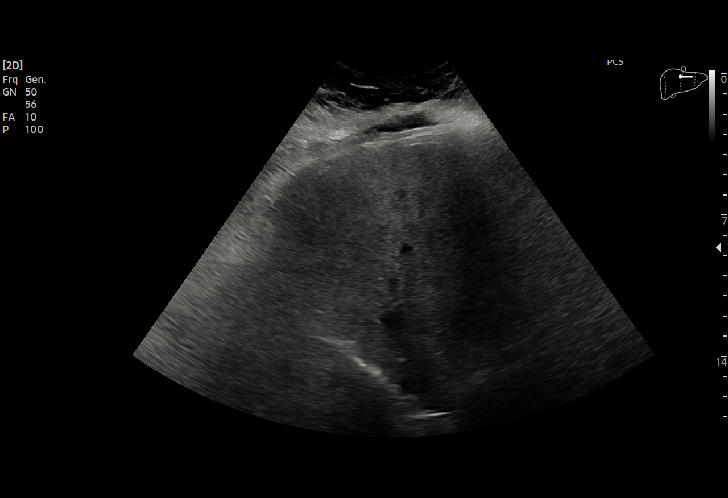
[im 42/53]
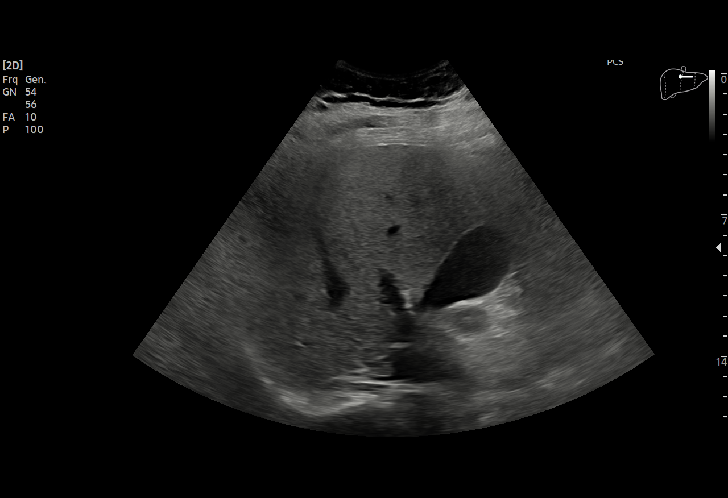
[im 44/53]
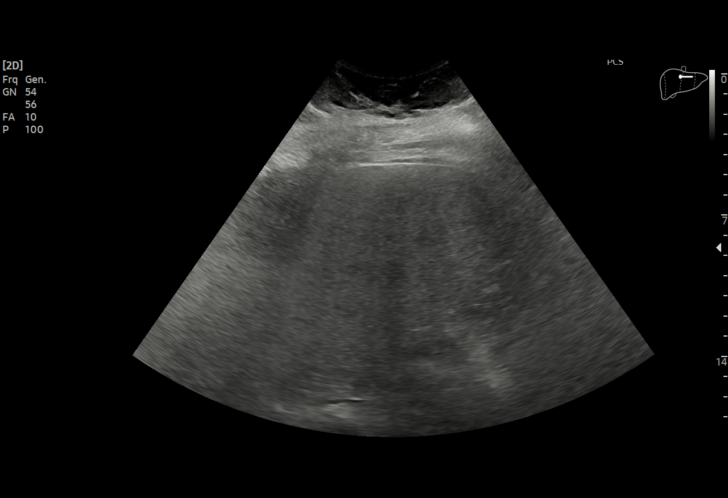
[im 48/53]
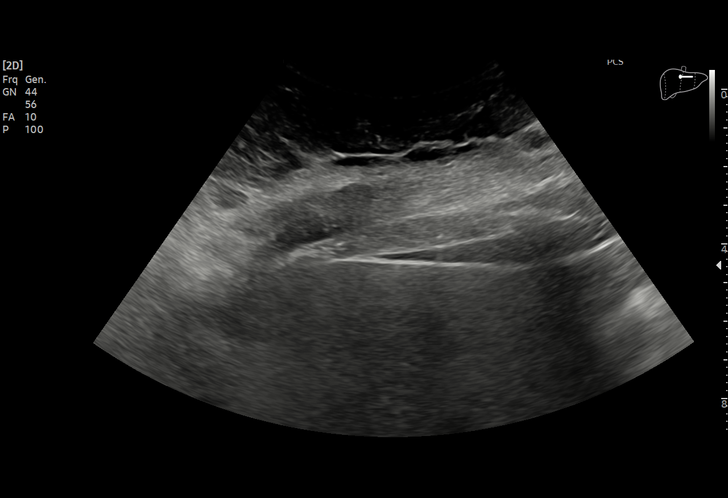
[im 53/53]
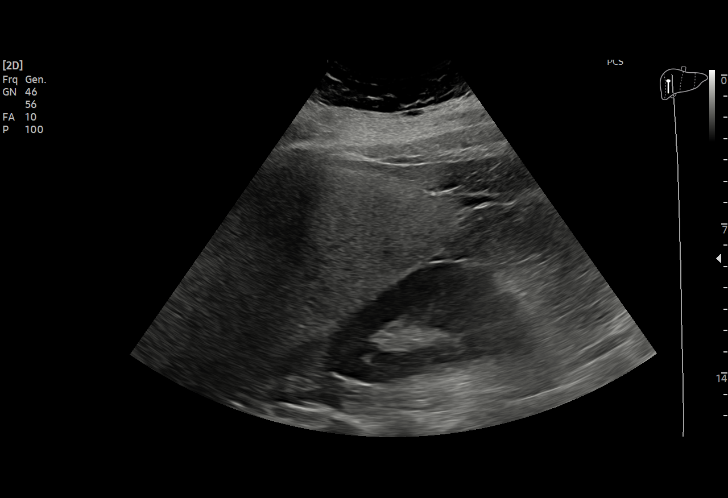

[15 of 25 positions shown; findings below may reference images not displayed]

FINDINGS: Gallbladder:

No gallstones or wall thickening visualized. No sonographic Murphy
sign noted by sonographer.

Common bile duct:

Diameter: 3.5 mm

Liver:

Diffusely echogenic liver consistent with steatosis. Hypoechoic
region near gallbladder fossa. Portal vein is patent on color
Doppler imaging with normal direction of blood flow towards the
liver.

Other: None.
IMPRESSION: 1. Negative for gallstones.
2. Hepatic steatosis with suspected fat sparing near the gallbladder
fossa

## 2022-04-28 ENCOUNTER — Other Ambulatory Visit: Payer: Self-pay

## 2022-04-28 ENCOUNTER — Emergency Department: Payer: BC Managed Care – PPO

## 2022-04-28 ENCOUNTER — Emergency Department
Admission: EM | Admit: 2022-04-28 | Discharge: 2022-04-28 | Disposition: A | Discharge status: Home / Self Care | Payer: BC Managed Care – PPO | Attending: Emergency Medicine | Admitting: Emergency Medicine

## 2022-04-28 DIAGNOSIS — R0789 Other chest pain: Secondary | ICD-10-CM | POA: Diagnosis not present

## 2022-04-28 DIAGNOSIS — R0602 Shortness of breath: Secondary | ICD-10-CM | POA: Diagnosis not present

## 2022-04-28 DIAGNOSIS — J45909 Unspecified asthma, uncomplicated: Secondary | ICD-10-CM | POA: Diagnosis not present

## 2022-04-28 DIAGNOSIS — I10 Essential (primary) hypertension: Secondary | ICD-10-CM | POA: Diagnosis not present

## 2022-04-28 DIAGNOSIS — R079 Chest pain, unspecified: Secondary | ICD-10-CM | POA: Diagnosis not present

## 2022-04-28 DIAGNOSIS — J9801 Acute bronchospasm: Secondary | ICD-10-CM | POA: Diagnosis not present

## 2022-04-28 LAB — BASIC METABOLIC PANEL
Anion gap: 6 (ref 5–15)
BUN: 20 mg/dL (ref 6–20)
CO2: 25 mmol/L (ref 22–32)
Calcium: 8.5 mg/dL — ABNORMAL LOW (ref 8.9–10.3)
Chloride: 104 mmol/L (ref 98–111)
Creatinine, Ser: 0.67 mg/dL (ref 0.44–1.00)
GFR, Estimated: >60 mL/min (ref >60)
Glucose, Bld: 164 mg/dL — ABNORMAL HIGH (ref 70–99)
Potassium: 4 mmol/L (ref 3.5–5.1)
Sodium: 135 mmol/L (ref 135–145)

## 2022-04-28 LAB — CBC
HCT: 40.7 % (ref 36.0–46.0)
Hemoglobin: 13.5 g/dL (ref 12.0–15.0)
MCH: 30.8 pg (ref 26.0–34.0)
MCHC: 33.2 g/dL (ref 30.0–36.0)
MCV: 92.9 fL (ref 80.0–100.0)
Platelets: 215 10*3/uL (ref 150–400)
RBC: 4.38 MIL/uL (ref 3.87–5.11)
RDW: 12.5 % (ref 11.5–15.5)
WBC: 7.4 10*3/uL (ref 4.0–10.5)
nRBC: 0 % (ref 0.0–0.2)

## 2022-04-28 LAB — TROPONIN I (HIGH SENSITIVITY): Troponin I (High Sensitivity): 7 ng/L (ref <18)

## 2022-04-28 MED ORDER — OMEPRAZOLE MAGNESIUM 20 MG PO TBEC: 20.0000 mg | DELAYED_RELEASE_TABLET | Freq: Every day | ORAL | 1 refills | Status: DC

## 2022-04-28 MED ORDER — IPRATROPIUM-ALBUTEROL 0.5-2.5 (3) MG/3ML IN SOLN
3.0000 mL | Freq: Once | RESPIRATORY_TRACT | Status: AC
  Administered 2022-04-28: 3 mL via RESPIRATORY_TRACT
  Filled 2022-04-28: qty 3

## 2022-04-28 MED ORDER — ALBUTEROL SULFATE HFA 108 (90 BASE) MCG/ACT IN AERS
2.0000 | INHALATION_SPRAY | Freq: Four times a day (QID) | RESPIRATORY_TRACT | 2 refills | Status: AC | PRN
Start: 2022-04-28 — End: ?
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
## 2022-05-27 DIAGNOSIS — R051 Acute cough: Secondary | ICD-10-CM | POA: Diagnosis not present

## 2022-05-27 DIAGNOSIS — Z03818 Encounter for observation for suspected exposure to other biological agents ruled out: Secondary | ICD-10-CM | POA: Diagnosis not present

## 2022-05-27 DIAGNOSIS — J019 Acute sinusitis, unspecified: Secondary | ICD-10-CM | POA: Diagnosis not present

## 2022-05-27 DIAGNOSIS — J40 Bronchitis, not specified as acute or chronic: Secondary | ICD-10-CM | POA: Diagnosis not present

## 2022-06-09 ENCOUNTER — Ambulatory Visit (HOSPITAL_COMMUNITY)
Admission: EM | Admit: 2022-06-09 | Discharge: 2022-06-09 | Disposition: A | Discharge status: Home / Self Care | Payer: BC Managed Care – PPO | Attending: Internal Medicine | Admitting: Internal Medicine

## 2022-06-09 ENCOUNTER — Encounter (HOSPITAL_COMMUNITY): Payer: Self-pay | Admitting: *Deleted

## 2022-06-09 ENCOUNTER — Other Ambulatory Visit: Payer: Self-pay

## 2022-06-09 DIAGNOSIS — R030 Elevated blood-pressure reading, without diagnosis of hypertension: Secondary | ICD-10-CM

## 2022-06-09 DIAGNOSIS — R001 Bradycardia, unspecified: Secondary | ICD-10-CM

## 2022-06-09 DIAGNOSIS — R0789 Other chest pain: Secondary | ICD-10-CM

## 2022-06-09 NOTE — Discharge Instructions (Addendum)
As discussed I will place a referral for you to be evaluated by cardiologist.  Was reassuring is that you have had a normal echocardiogram back in 2022.  Your EKG is unremarkable and just indicates that you have sinus bradycardia.  However given the fluctuations with your blood pressure and current chest pain I will refer you over to cardiology for evaluation.  In the meantime get established with a primary care doctor so someone can keep a close eye on your blood pressure in case you need to restart blood pressure medication.

## 2022-06-09 NOTE — ED Triage Notes (Signed)
Pt reports she has been feeling weird for a few days . Pt 's BP 160/94 and 154/93 when checked . Pt checked her BP because she was feeling weird. Pt reports feeling butterflies in her chest like you are anxious. In 2023 Pt was in hospital for possible brady cardia.

## 2022-06-09 NOTE — ED Provider Notes (Signed)
[facility]    CSN: [id number] Arrival date & time: 06/09/22  8657      History   Chief Complaint Chief Complaint  Patient presents with   Hypertension    HPI Bonnie Shepard is a 37 y.o. female.   HPI Patient with a history of daily smoking, hypertension presents today for evaluation of elevated blood pressure.  Patient has been seen at the emergency department in April with complaints of atypical chest pain and bronchospasms.  She had an EKG obtained during that visit which showed sinus bradycardia with sinus arrhythmia although no acute ST changes.  On chart review patient has also had a echocardiogram performed back in 2022 which was unremarkable.  On chart review patient has had multiple elevated blood pressure readings however she is currently not prescribed any blood pressure medications.  Past Medical History:  Diagnosis Date   Acid reflux    Asthma    Family history of adverse reaction to anesthesia    Mother - PONV   Hemorrhoids    Infection    freq UTI   Vaginal Pap smear, abnormal    f/u wnl   Wears contact lenses     Patient Active Problem List   Diagnosis Date Noted   Vitamin D deficiency 06/18/2020   Acute left-sided low back pain with left-sided sciatica 06/18/2020   Recurrent urinary tract infection 06/18/2020   Bradycardia with 41-50 beats per minute 04/20/2020   No-show for appointment 04/18/2020   Refractory nausea and vomiting    Diverticulitis 03/28/2020   Suspected COVID-19 virus infection 03/28/2020   Non-recurrent acute serous otitis media of right ear 03/28/2020   Hypertension 03/28/2020   Allergic rhinitis 03/28/2020   Bacteria in urine 02/07/2020   History of dehydration 02/07/2020   Intractable vomiting with nausea 01/17/2020   History of foot fracture- left  10/10/2019   Fatigue 08/09/2019   Lymphadenopathy- posterior left side  08/09/2019   GERD (gastroesophageal reflux disease) 07/21/2019   History of nausea and vomiting  07/21/2019   Epigastric burning sensation 07/21/2019   Hematochezia    Hemorrhoids, external, thrombosed 03/23/2019   Rectal or anal pain 03/23/2019   History of rectal bleeding 03/23/2019   Trichomonas vaginitis 02/28/2019   Vaginal high risk HPV DNA test positive 02/28/2019   Anxiety with depression 02/23/2019   Body mass index (BMI) of 40.1-44.9 in adult [facility]) 02/23/2019   Left foot pain 02/23/2019   Cervical cancer screening 02/23/2019   Morbid obesity (HCC) 02/21/2019   Itching in the vaginal area 02/21/2019   History of pregnancy loss in prior pregnancy, currently pregnant in second trimester 07/10/2016   Incontinence overflow, stress female 12/03/2015   Nausea 01/25/2008   Diarrhea of presumed infectious origin 01/25/2008    Past Surgical History:  Procedure Laterality Date   COLONOSCOPY WITH PROPOFOL N/A 04/03/2019   Procedure: COLONOSCOPY WITH PROPOFOL;  Surgeon: Midge Minium, MD;  Location: [facility];  Service: Endoscopy;  Laterality: N/A;  Priority 3   DILATION AND EVACUATION N/A 09/07/2013   Procedure: DILATATION AND EVACUATION;  Surgeon: Freddrick March. Tenny Craw, MD;  Location: [facility];  Service: Gynecology;  Laterality: N/A;   ESOPHAGOGASTRODUODENOSCOPY (EGD) WITH PROPOFOL N/A 04/04/2020   Procedure: ESOPHAGOGASTRODUODENOSCOPY (EGD) WITH BIOPSY;  Surgeon: Midge Minium, MD;  Location: [facility];  Service: Endoscopy;  Laterality: N/A;   TONSILLECTOMY      OB History     Gravida  3   Para  1   Term  1  Preterm      AB  1   Living  1      SAB      IAB      Ectopic      Multiple  0   Live Births  1            Home Medications    Prior to Admission medications   Medication Sig Start Date End Date Taking? Authorizing Provider  albuterol (VENTOLIN HFA) 108 (90 Base) MCG/ACT inhaler Inhale 2 puffs into the lungs every 6 (six) hours as needed for wheezing or shortness of breath. 04/28/22  Yes Jene Every, MD  fluticasone (FLONASE) 50  MCG/ACT nasal spray PLACE 2 SPRAYS INTO BOTH NOSTRILS DAILY. 03/28/20 06/09/22 Yes Flinchum, Eula Fried, FNP  omeprazole (PRILOSEC OTC) 20 MG tablet Take 1 tablet (20 mg total) by mouth daily. 04/28/22 04/28/23 Yes Jene Every, MD  escitalopram (LEXAPRO) 5 MG tablet TAKE 1 TABLET BY MOUTH DAILY. 06/18/20 06/18/21  Flinchum, Eula Fried, FNP    Family History Family History  Problem Relation Age of Onset   Hypertension Mother    Hypertension Father    Hypertension Maternal Grandmother    Heart disease Maternal Grandmother    Hypertension Other    Cancer Other    Hearing loss Neg Hx     Social History Social History   Tobacco Use   Smoking status: Every Day    Packs/day: 0.25    Years: 16.00    Additional pack years: 0.00    Total pack years: 4.00    Types: Cigarettes   Smokeless tobacco: Never   Tobacco comments:    since age 19  Vaping Use   Vaping Use: Some days  Substance Use Topics   Alcohol use: No    Comment: occasionally   Drug use: No     Allergies   Patient has no known allergies.   Review of Systems Review of Systems Pertinent negatives listed in HPI  Physical Exam Triage Vital Signs ED Triage Vitals [06/09/22 0902]  Enc Vitals Group     BP      Pulse      Resp      Temp      Temp src      SpO2      Weight      Height      Head Circumference      Peak Flow      Pain Score 0     Pain Loc      Pain Edu?      Excl. in GC?    No data found.  Updated Vital Signs BP (!) 148/107   Pulse 67   Temp 98 F (36.7 C)   Resp 20   LMP 05/06/2022   SpO2 94%   Visual Acuity Right Eye Distance:   Left Eye Distance:   Bilateral Distance:    Right Eye Near:   Left Eye Near:    Bilateral Near:     Physical Exam Vitals reviewed.  Constitutional:      Appearance: Normal appearance.  HENT:     Head: Normocephalic and atraumatic.     Right Ear: Tympanic membrane normal.     Left Ear: Tympanic membrane normal.     Nose: Nose normal.      Mouth/Throat:     Mouth: Mucous membranes are moist.  Eyes:     Extraocular Movements: Extraocular movements intact.     Conjunctiva/sclera: Conjunctivae normal.  Pupils: Pupils are equal, round, and reactive to light.  Cardiovascular:     Rate and Rhythm: Regular rhythm. Bradycardia present.  Pulmonary:     Effort: Pulmonary effort is normal.     Breath sounds: Normal breath sounds. No wheezing or rhonchi.  Chest:     Chest wall: No tenderness.  Abdominal:     Palpations: Abdomen is soft.  Musculoskeletal:        General: Normal range of motion.     Cervical back: Normal range of motion.  Skin:    General: Skin is warm and dry.  Neurological:     General: No focal deficit present.     Mental Status: She is alert.      UC Treatments / Results  Labs (all labs ordered are listed, but only abnormal results are displayed) Labs Reviewed - No data to display  EKG Sinus bradycardia with sinus arrhythmia, no ST changes, 50 bpm  Radiology No results found.  Procedures Procedures (including critical care time)  Medications Ordered in UC Medications - No data to display  Initial Impression / Assessment and Plan / UC Course  I have reviewed the triage vital signs and the nursing notes.  Pertinent labs & imaging results that were available during my care of the patient were reviewed by me and considered in my medical decision making (see chart for details).    EKG, consistent with recent EKG which shows sinus bradycardia with sinus arrhythmia with no ST changes.  Has had documented patient's and blood pressure however reports at home her blood pressure has been stable.  As discussed with patient she really needs to establish with primary care for ongoing monitoring and management of her blood pressure.  She is concerning regarding atypical chest pain and is requesting a second opinion by cardiology.  Cardiology referral placed for further workup and evaluation.  Patient agrees  to go to the ED if any of her symptoms worsen or become severe prior to following up with cardiology. Final Clinical Impressions(s) / UC Diagnoses   Final diagnoses:  Elevated BP without diagnosis of hypertension  Sinus bradycardia  Atypical chest pain     Discharge Instructions      As discussed I will place a referral for you to be evaluated by cardiologist.  Was reassuring is that you have had a normal echocardiogram back in 2022.  Your EKG is unremarkable and just indicates that you have sinus bradycardia.  However given the fluctuations with your blood pressure and current chest pain I will refer you over to cardiology for evaluation.  In the meantime get established with a primary care doctor so someone can keep a close eye on your blood pressure in case you need to restart blood pressure medication.     ED Prescriptions   None    PDMP not reviewed this encounter.   Bing Neighbors, NP 06/09/22 [phone]

## 2022-07-19 NOTE — Progress Notes (Deleted)
New patient visit  Patient: Bonnie Shepard   DOB: 1985-10-25   37 y.o. Female  MRN: [id number] Visit Date: 07/24/2022  Today's healthcare provider: Debera Lat, PA-C   No chief complaint on file.  Subjective    Bonnie Shepard is a 37 y.o. female who presents today as a new patient to establish care.  HPI  *** Discussed the use of AI scribe software for clinical note transcription with the patient, who gave verbal consent to proceed.  History of Present Illness            Past Medical History:  Diagnosis Date  . Acid reflux   . Asthma   . Family history of adverse reaction to anesthesia    Mother - PONV  . Hemorrhoids   . Infection    freq UTI  . Vaginal Pap smear, abnormal    f/u wnl  . Wears contact lenses    Past Surgical History:  Procedure Laterality Date  . COLONOSCOPY WITH PROPOFOL N/A 04/03/2019   Procedure: COLONOSCOPY WITH PROPOFOL;  Surgeon: Midge Minium, MD;  Location: [facility];  Service: Endoscopy;  Laterality: N/A;  Priority 3  . DILATION AND EVACUATION N/A 09/07/2013   Procedure: DILATATION AND EVACUATION;  Surgeon: Freddrick March. Tenny Craw, MD;  Location: [facility];  Service: Gynecology;  Laterality: N/A;  . ESOPHAGOGASTRODUODENOSCOPY (EGD) WITH PROPOFOL N/A 04/04/2020   Procedure: ESOPHAGOGASTRODUODENOSCOPY (EGD) WITH BIOPSY;  Surgeon: Midge Minium, MD;  Location: [facility];  Service: Endoscopy;  Laterality: N/A;  . TONSILLECTOMY     Family Status  Relation Name Status  . Mother  Alive  . Father  Alive  . MGM  (Not Specified)  . Other  (Not Specified)  . Neg Hx  (Not Specified)  No partnership data on file   Family History  Problem Relation Age of Onset  . Hypertension Mother   . Hypertension Father   . Hypertension Maternal Grandmother   . Heart disease Maternal Grandmother   . Hypertension Other   . Cancer Other   . Hearing loss Neg Hx    Social History   Socioeconomic History  . Marital status: Single    Spouse name: Not on file  .  Number of children: Not on file  . Years of education: Not on file  . Highest education level: Not on file  Occupational History  . Not on file  Tobacco Use  . Smoking status: Every Day    Current packs/day: 0.25    Average packs/day: 0.3 packs/day for 16.0 years (4.0 ttl pk-yrs)    Types: Cigarettes  . Smokeless tobacco: Never  . Tobacco comments:    since age 76  Vaping Use  . Vaping status: Some Days  Substance and Sexual Activity  . Alcohol use: No    Comment: occasionally  . Drug use: No  . Sexual activity: Yes    Birth control/protection: None  Other Topics Concern  . Not on file  Social History Narrative  . Not on file   Social Determinants of Health   Financial Resource Strain: Not on file  Food Insecurity: Not on file  Transportation Needs: Not on file  Physical Activity: Not on file  Stress: Not on file  Social Connections: Not on file   Outpatient Medications Prior to Visit  Medication Sig  . albuterol (VENTOLIN HFA) 108 (90 Base) MCG/ACT inhaler Inhale 2 puffs into the lungs every 6 (six) hours as needed for wheezing or shortness of breath.  . escitalopram (  LEXAPRO) 5 MG tablet TAKE 1 TABLET BY MOUTH DAILY.  . fluticasone (FLONASE) 50 MCG/ACT nasal spray PLACE 2 SPRAYS INTO BOTH NOSTRILS DAILY.  Marland Kitchen omeprazole (PRILOSEC OTC) 20 MG tablet Take 1 tablet (20 mg total) by mouth daily.   No facility-administered medications prior to visit.   No Known Allergies  Immunization History  Administered Date(s) Administered  . Influenza-Unspecified 09/11/2017  . Tdap 09/18/2015    Health Maintenance  Topic Date Due  . FOOT EXAM  Never done  . Diabetic kidney evaluation - Urine ACR  10/15/2016  . OPHTHALMOLOGY EXAM  06/12/2020  . HEMOGLOBIN A1C  07/16/2020  . COVID-19 Vaccine (1 - 2023-24 season) Never done  . PAP SMEAR-Modifier  02/20/2022  . INFLUENZA VACCINE  08/06/2022  . Diabetic kidney evaluation - eGFR measurement  04/28/2023  . DTaP/Tdap/Td (2 - Td or  Tdap) 09/17/2025  . Hepatitis C Screening  Completed  . HIV Screening  Completed  . HPV VACCINES  Aged Out    Patient Care Team: Pcp, No as PCP - General  Review of Systems  All other systems reviewed and are negative. Except see HPI   {Insert previous labs (optional):23779}  {See past labs  Heme  Chem  Endocrine  Serology  Results Review (optional):1}   Objective    There were no vitals taken for this visit. {Insert last BP/Wt (optional):23777}  {See vitals history (optional):1}  Physical Exam  Depression Screen    06/18/2020   11:19 AM 10/10/2019    4:06 PM 01/19/2019    3:42 PM 12/03/2015    1:43 PM  PHQ 2/9 Scores  PHQ - 2 Score 0 0 1 1  PHQ- 9 Score  4 9 7    No results found for any visits on 07/24/22.  Assessment & Plan     *** Assessment and Plan              Encounter to establish care Welcomed to our clinic Reviewed past medical hx, social hx, family hx and surgical hx Pt advised to send all vaccination records or screening   No follow-ups on file.     [facility]

## 2022-07-24 ENCOUNTER — Ambulatory Visit: Payer: Self-pay | Admitting: Physician Assistant

## 2022-07-28 ENCOUNTER — Ambulatory Visit
Admission: EM | Admit: 2022-07-28 | Discharge: 2022-07-28 | Disposition: A | Discharge status: Home / Self Care | Payer: BC Managed Care – PPO | Attending: Emergency Medicine | Admitting: Emergency Medicine

## 2022-07-28 DIAGNOSIS — L0291 Cutaneous abscess, unspecified: Secondary | ICD-10-CM | POA: Diagnosis not present

## 2022-07-28 MED ORDER — DOXYCYCLINE HYCLATE 100 MG PO CAPS: 100.0000 mg | ORAL_CAPSULE | Freq: Two times a day (BID) | ORAL | 0 refills | Status: AC

## 2022-07-28 NOTE — ED Triage Notes (Signed)
Patient to Urgent Care with complaints of abscess present to her groin/ bikini line. Reports initially feeling a "marble" under her skin around 6 months ago. Area started draining today- has had brown/ purulent drainage.  Sore with movement.

## 2022-07-28 NOTE — ED Provider Notes (Signed)
Renaldo Fiddler    CSN: [id number] Arrival date & time: 07/28/22  1614      History   Chief Complaint Chief Complaint  Patient presents with   Abscess    HPI Bonnie Shepard is a 37 y.o. female.  Patient presents with a knot on her left anterior groin x 6 months.  The area opened this morning in the shower and has been draining pus.  It is tender to touch.  No fever, chills, or other symptoms.  No treatment at home.    The history is provided by the patient and medical records.    Past Medical History:  Diagnosis Date   Acid reflux    Asthma    Family history of adverse reaction to anesthesia    Mother - PONV   Hemorrhoids    Infection    freq UTI   Vaginal Pap smear, abnormal    f/u wnl   Wears contact lenses     Patient Active Problem List   Diagnosis Date Noted   Vitamin D deficiency 06/18/2020   Acute left-sided low back pain with left-sided sciatica 06/18/2020   Recurrent urinary tract infection 06/18/2020   Bradycardia with 41-50 beats per minute 04/20/2020   No-show for appointment 04/18/2020   Refractory nausea and vomiting    Diverticulitis 03/28/2020   Suspected COVID-19 virus infection 03/28/2020   Non-recurrent acute serous otitis media of right ear 03/28/2020   Hypertension 03/28/2020   Allergic rhinitis 03/28/2020   Bacteria in urine 02/07/2020   History of dehydration 02/07/2020   Intractable vomiting with nausea 01/17/2020   History of foot fracture- left  10/10/2019   Fatigue 08/09/2019   Lymphadenopathy- posterior left side  08/09/2019   GERD (gastroesophageal reflux disease) 07/21/2019   History of nausea and vomiting 07/21/2019   Epigastric burning sensation 07/21/2019   Hematochezia    Hemorrhoids, external, thrombosed 03/23/2019   Rectal or anal pain 03/23/2019   History of rectal bleeding 03/23/2019   Trichomonas vaginitis 02/28/2019   Vaginal high risk HPV DNA test positive 02/28/2019   Anxiety with depression 02/23/2019   Body  mass index (BMI) of 40.1-44.9 in adult [facility]) 02/23/2019   Left foot pain 02/23/2019   Cervical cancer screening 02/23/2019   Morbid obesity (HCC) 02/21/2019   Itching in the vaginal area 02/21/2019   History of pregnancy loss in prior pregnancy, currently pregnant in second trimester 07/10/2016   Incontinence overflow, stress female 12/03/2015   Nausea 01/25/2008   Diarrhea of presumed infectious origin 01/25/2008    Past Surgical History:  Procedure Laterality Date   COLONOSCOPY WITH PROPOFOL N/A 04/03/2019   Procedure: COLONOSCOPY WITH PROPOFOL;  Surgeon: Midge Minium, MD;  Location: [facility];  Service: Endoscopy;  Laterality: N/A;  Priority 3   DILATION AND EVACUATION N/A 09/07/2013   Procedure: DILATATION AND EVACUATION;  Surgeon: Freddrick March. Tenny Craw, MD;  Location: [facility];  Service: Gynecology;  Laterality: N/A;   ESOPHAGOGASTRODUODENOSCOPY (EGD) WITH PROPOFOL N/A 04/04/2020   Procedure: ESOPHAGOGASTRODUODENOSCOPY (EGD) WITH BIOPSY;  Surgeon: Midge Minium, MD;  Location: [facility];  Service: Endoscopy;  Laterality: N/A;   TONSILLECTOMY      OB History     Gravida  3   Para  1   Term  1   Preterm      AB  1   Living  1      SAB      IAB      Ectopic  Multiple  0   Live Births  1            Home Medications    Prior to Admission medications   Medication Sig Start Date End Date Taking? Authorizing Provider  doxycycline (VIBRAMYCIN) 100 MG capsule Take 1 capsule (100 mg total) by mouth 2 (two) times daily for 7 days. 07/28/22 08/04/22 Yes Mickie Bail, NP  albuterol (VENTOLIN HFA) 108 (90 Base) MCG/ACT inhaler Inhale 2 puffs into the lungs every 6 (six) hours as needed for wheezing or shortness of breath. 04/28/22   Jene Every, MD  escitalopram (LEXAPRO) 5 MG tablet TAKE 1 TABLET BY MOUTH DAILY. 06/18/20 06/18/21  Flinchum, Eula Fried, FNP  fluticasone (FLONASE) 50 MCG/ACT nasal spray PLACE 2 SPRAYS INTO BOTH NOSTRILS DAILY. 03/28/20 06/09/22   Flinchum, Eula Fried, FNP  omeprazole (PRILOSEC OTC) 20 MG tablet Take 1 tablet (20 mg total) by mouth daily. 04/28/22 04/28/23  Jene Every, MD    Family History Family History  Problem Relation Age of Onset   Hypertension Mother    Hypertension Father    Hypertension Maternal Grandmother    Heart disease Maternal Grandmother    Hypertension Other    Cancer Other    Hearing loss Neg Hx     Social History Social History   Tobacco Use   Smoking status: Every Day    Current packs/day: 0.25    Average packs/day: 0.3 packs/day for 16.0 years (4.0 ttl pk-yrs)    Types: Cigarettes   Smokeless tobacco: Never   Tobacco comments:    since age 70  Vaping Use   Vaping status: Some Days  Substance Use Topics   Alcohol use: No    Comment: occasionally   Drug use: No     Allergies   Patient has no known allergies.   Review of Systems Review of Systems  Constitutional:  Negative for chills and fever.  Gastrointestinal:  Negative for abdominal pain, diarrhea, nausea and vomiting.  Genitourinary:  Negative for dysuria, hematuria, pelvic pain and vaginal discharge.  Skin:  Positive for wound. Negative for color change.     Physical Exam Triage Vital Signs ED Triage Vitals  Encounter Vitals Group     BP --      Systolic BP Percentile --      Diastolic BP Percentile --      Pulse Rate 07/28/22 1622 92     Resp 07/28/22 1622 18     Temp 07/28/22 1622 97.8 F (36.6 C)     Temp src --      SpO2 07/28/22 1622 96 %     Weight --      Height --      Head Circumference --      Peak Flow --      Pain Score 07/28/22 1628 0     Pain Loc --      Pain Education --      Exclude from Growth Chart --    No data found.  Updated Vital Signs BP (!) 148/94   Pulse 92   Temp 97.8 F (36.6 C)   Resp 18   LMP 07/06/2022   SpO2 96%   Visual Acuity Right Eye Distance:   Left Eye Distance:   Bilateral Distance:    Right Eye Near:   Left Eye Near:    Bilateral Near:      Physical Exam Vitals and nursing note reviewed.  Constitutional:      General:  She is not in acute distress.    Appearance: She is well-developed.  HENT:     Mouth/Throat:     Mouth: Mucous membranes are moist.  Cardiovascular:     Rate and Rhythm: Normal rate and regular rhythm.  Pulmonary:     Effort: Pulmonary effort is normal. No respiratory distress.  Abdominal:     General: Bowel sounds are normal.     Tenderness: There is no abdominal tenderness. There is no guarding or rebound.  Musculoskeletal:     Cervical back: Neck supple.  Skin:    General: Skin is warm and dry.     Findings: Lesion present.     Comments: Left groin: 2 cm tender area of induration with central open lesion; scant purulent drainage.    Neurological:     Mental Status: She is alert.  Psychiatric:        Mood and Affect: Mood normal.      UC Treatments / Results  Labs (all labs ordered are listed, but only abnormal results are displayed) Labs Reviewed - No data to display  EKG   Radiology No results found.  Procedures Procedures (including critical care time)  Medications Ordered in UC Medications - No data to display  Initial Impression / Assessment and Plan / UC Course  I have reviewed the triage vital signs and the nursing notes.  Pertinent labs & imaging results that were available during my care of the patient were reviewed by me and considered in my medical decision making (see chart for details).    Skin abscess of left groin.  No I&D indicated at this time as the area is already open and draining.  Treating with doxycycline.  Wound care instructions and signs of worsening infection discussed.  Education provided on skin abscess.  Instructed patient to follow up with her PCP if her symptoms are not improving.  She agrees to plan of care.    Final Clinical Impressions(s) / UC Diagnoses   Final diagnoses:  Abscess     Discharge Instructions      Take the doxycycline  as directed.  Follow up with your primary care provider if your symptoms are not improving.        ED Prescriptions     Medication Sig Dispense Auth. Provider   doxycycline (VIBRAMYCIN) 100 MG capsule Take 1 capsule (100 mg total) by mouth 2 (two) times daily for 7 days. 14 capsule Mickie Bail, NP      PDMP not reviewed this encounter.   Mickie Bail, NP 07/28/22 [phone]

## 2022-07-28 NOTE — Discharge Instructions (Addendum)
Take the doxycycline as directed.    Follow up with your primary care provider if your symptoms are not improving.    

## 2022-08-27 ENCOUNTER — Encounter: Payer: Self-pay | Admitting: Cardiology

## 2022-08-27 ENCOUNTER — Ambulatory Visit: Payer: BC Managed Care – PPO | Admitting: Cardiology

## 2022-08-27 VITALS — BP 132/96 | HR 56 | Ht 63.0 in | Wt 242.4 lb

## 2022-08-27 DIAGNOSIS — R079 Chest pain, unspecified: Secondary | ICD-10-CM

## 2022-08-27 DIAGNOSIS — I498 Other specified cardiac arrhythmias: Secondary | ICD-10-CM

## 2022-08-27 DIAGNOSIS — I10 Essential (primary) hypertension: Secondary | ICD-10-CM

## 2022-08-27 MED ORDER — LOSARTAN POTASSIUM 25 MG PO TABS: 25.0000 mg | ORAL_TABLET | Freq: Every day | ORAL | 3 refills | Status: DC

## 2022-08-27 NOTE — Patient Instructions (Addendum)
Your physician would like for you to Double/Triple your water intake  Medication Instructions:  Start Taking: Losartan 25 mg once daily  *If you need a refill on your cardiac medications before your next appointment, please call your pharmacy*   Lab Work: none If you have labs (blood work) drawn today and your tests are completely normal, you will receive your results only by: MyChart Message (if you have MyChart) OR A paper copy in the mail If you have any lab test that is abnormal or we need to change your treatment, we will call you to review the results.   Testing/Procedures: Your provider has ordered a exercise tolerance test. This test will evaluate the blood supply to your heart muscle during periods of exercise and rest. For this test, you will raise your heart rate by walking on a treadmill at different levels.   you may eat a light breakfast/ lunch prior to your procedure no caffeine for 24 hours prior to your test (coffee, tea, soft drinks, or chocolate)  no smoking/ vaping for 4 hours prior to your test you may take your regular medications the day of your test  bring any inhalers with you to your test wear comfortable clothing & tennis/ non-skid shoes to walk on the treadmill  This will take place at [address] ([facility] [address], [place] [address]    Follow-Up: At [facility], you and your health needs are our priority.  As part of our continuing mission to provide you with exceptional heart care, we have created designated Provider Care Teams.  These Care Teams include your primary Cardiologist (physician) and Advanced Practice Providers (APPs -  Physician Assistants and Nurse Practitioners) who all work together to provide you with the care you need, when you need it.  We recommend signing up for the patient portal called "MyChart".  Sign up information is provided on this After Visit Summary.  MyChart is used to connect with patients for  Virtual Visits (Telemedicine).  Patients are able to view lab/test results, encounter notes, upcoming appointments, etc.  Non-urgent messages can be sent to your provider as well.   To learn more about what you can do with MyChart, go to [email or web address].    Your next appointment:   6 week(s)  Provider:   You may see Bryan Lemma, MD or one of the following Advanced Practice Providers on your designated Care Team:   Nicolasa Ducking, NP Eula Listen, PA-C Cadence Fransico Michael, PA-C Charlsie Quest, NP

## 2022-08-27 NOTE — Progress Notes (Signed)
Cardiology Office Note:  .   Date:  08/29/2022  ID:  Bonnie Shepard, DOB 22-Sep-1985, MRN [id number] PCP: Pcp, No   HeartCare Providers Cardiologist:  None     Chief Complaint  Patient presents with   New Patient (Initial Visit)    Pt c/o htn and intermittent flutters. Requesting cardiac workup. Pt feels well today. Meds reviewed.    History of Present Illness: .     Bonnie Shepard is an obese 37 y.o. female with a PMH notable for "undiagnosed HTN" who presents here for evaluation of HTN at the request of Bing Neighbors, NP.  Altheria Vong was seen @ [facility] in [place] on June 09, 2022 for evaluation of elevated BP.  She had multiple episodes back in April she presented with atypical chest pain.  BP was 147/107.  EKG showed sinus bradycardia with sinus arrhythmia.  Noted that her blood pressure at home has been stable.  For some reason the patient recommended a second opinion and a cardiology consult was placed.  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  What she has noticed now for the last several months is that there are times when (especially during the heat of summer), she will be on the 15 pounds and have to actually come off to be substituted because of feeling her heart fluttering and this causing her to feel somewhat dizzy and lightheaded.  She has not actually passed out but she is has definitely felt poorly in these episodes.  She sometimes has some tightness and pressure in her chest with a fluttering although it is not consistent.  She freely admits that she probably does not do well enough hydrating as far as drinking enough water and/or electrolyte placement rehydration  drinks.  She has had episodes of right upper quadrant discomfort with activity as well, but has also had the the chest discomfort which is often the left substernal and always worse with activity.  It may be worse simply with movements but definitely is not occurring at rest.  She is concerned also about her blood pressures with the diastolic pressures being elevated.  ROS:  Cardiovascular ROS: positive for - chest pain, dyspnea on exertion, palpitations, and rapid heart rate negative for - edema, irregular heartbeat, orthopnea, paroxysmal nocturnal dyspnea, shortness of breath, or although she has lightheadedness or dizziness, no syncope or near syncope or TIA/amaurosis fugax. Review of Systems - Negative except right lower quadrant discomfort, and activity Limited by Apnea Back Pain.     Objective   Studies Reviewed: Marland Kitchen       ECHO 04/2020:  1. Left ventricular ejection fraction, by estimation, is 60 to 65%. The left ventricle has normal function. The left ventricle has no regional  wall motion abnormalities. Left ventricular diastolic parameters were normal.   2. Right ventricular systolic function is normal. The right ventricular size is normal.   3. The mitral valve is normal in structure. No evidence of mitral valve regurgitation.   4. The aortic valve is normal in structure. Aortic valve regurgitation is not visualized.   Risk Assessment/Calculations:     HYPERTENSION CONTROL Vitals:   08/27/22 1013  08/27/22 1014  BP: (!) 140/100 (!) 132/96    The patient's blood pressure is elevated above target today.  In order to address the patient's elevated BP: Blood pressure will be monitored at home to determine if medication changes need to be made.; A new medication was prescribed today.; Follow up with primary care provider for management.      Physical Exam:   VS:  BP (!) 132/96   Pulse (!) 56   Ht 5\' 3"  (1.6 m)   Wt 242 lb 6.4 oz (110 kg)   LMP 07/06/2022   SpO2 95%   BMI  42.94 kg/m    Wt Readings from Last 3 Encounters:  08/27/22 242 lb 6.4 oz (110 kg)  08/07/21 235 lb (106.6 kg)  01/12/21 230 lb (104.3 kg)    GEN: Well nourished, well developed in no acute distress; obese NECK: No JVD; No carotid bruits CARDIAC: Normal S1, S2; RRR, no murmurs, rubs, gallops RESPIRATORY:  Clear to auscultation without rales, wheezing or rhonchi ; nonlabored, good air movement.;  Left sternal border tenderness ABDOMEN: Soft, non-tender, non-distended EXTREMITIES:  No edema; No deformity   Lab Results  Component Value Date   NA 135 04/28/2022   CL 104 04/28/2022   K 4.0 04/28/2022   CO2 25 04/28/2022   BUN 20 04/28/2022   CREATININE 0.67 04/28/2022   GFRNONAA >60 04/28/2022   CALCIUM 8.5 (L) 04/28/2022   ALBUMIN 4.0 08/07/2021   GLUCOSE 164 (H) 04/28/2022   EKG Interpretation Date/Time:  Thursday August 27 2022 10:12:47 EDT Ventricular Rate:  56 PR Interval:  154 QRS Duration:  82 QT Interval:  442 QTC Calculation: 426 R Axis:   18  Text Interpretation: Sinus bradycardia with marked sinus arrhythmia When compared with ECG of 09-Jun-2022 09:15, No significant change was found Confirmed by Bryan Lemma ([id number]) on 08/29/2022 10:59:13 AM      ASSESSMENT AND PLAN: .    Problem List Items Addressed This Visit       Cardiology Problems   Hypertension - Primary (Chronic)    She has elevated diastolic pressures more so than systolic pressures.  In general we would like to consider using a diuretic to treat diastolic pressures, however with her tendency for dehydration and lack of adequate hydration during physical activity reluctant to put her on a diuretic.  Will start low-dose losartan 25 mg daily. We can recheck chemistry panel at follow-up visit.      Relevant Medications   losartan (COZAAR) 25 MG tablet   Other Relevant Orders   EKG 12-Lead (Completed)     Other   Periodic heart flutter    .  Heart fluttering sensation usually occurs with  activity probably is related to be dehydrated.  Stressed importance of maintaining adequate hydration.  Did not want to do a beta-blocker because her resting heart of 56 bpm.  Since most of her fluttering sensations have been with activity, we will start off with evaluating GXT.  However if that is not indicative of any obvious diagnoses, could consider short term event monitor.      Relevant Orders   Exercise Tolerance Test   Cardiac Stress Test: Informed Consent Details: Physician/Practitioner Attestation; Transcribe to consent form and obtain patient signature   Chest pain of uncertain etiology (Chronic)    Heart flutters and chest discomfort with activity.  She does have hypertension she is obese and is a smoker.  Is possible that her dyspnea related to deconditioning smoking.  Although  she does play sports, she does not indicate that she is running a lot.  More sedentary position.  EKG is interpreted.  Some of her chest pain seems to be reproducible at the left sternal border  Somewhat atypical pain  Plan: Check GXT to evaluate for symptoms, ischemic changes, BP response, and arrhythmias.      Relevant Orders   Exercise Tolerance Test   Cardiac Stress Test: Informed Consent Details: Physician/Practitioner Attestation; Transcribe to consent form and obtain patient signature    We did discuss her smoking and how this is probably contributing to some of her exertional dyspnea. Also discussed need for weight loss.     Informed Consent   Shared Decision Making/Informed Consent The risks [chest pain, shortness of breath, cardiac arrhythmias, dizziness, blood pressure fluctuations, myocardial infarction, stroke/transient ischemic attack, and life-threatening complications (estimated to be 1 in 10,000)], benefits (risk stratification, diagnosing coronary artery disease, treatment guidance) and alternatives of an exercise tolerance test were discussed in detail with Ms. Stutz and she agrees  to proceed.      Dispo: Return in about 6 weeks (around 10/08/2022) for Routine Follow-up after testing ~ 1-2 months.  Total time spent: 18 min spent with patient + 15 min spent charting = 33 min    Signed, Marykay Lex, MD, MS Bryan Lemma, M.D., M.S. Interventional Cardiologist  [facility]  Pager # [phone] Phone # [phone] [address] [place], [place] [address]

## 2022-08-29 ENCOUNTER — Encounter: Payer: Self-pay | Admitting: Cardiology

## 2022-08-29 NOTE — Assessment & Plan Note (Signed)
Heart flutters and chest discomfort with activity.  She does have hypertension she is obese and is a smoker.  Is possible that her dyspnea related to deconditioning smoking.  Although she does play sports, she does not indicate that she is running a lot.  More sedentary position.  EKG is interpreted.  Some of her chest pain seems to be reproducible at the left sternal border  Somewhat atypical pain  Plan: Check GXT to evaluate for symptoms, ischemic changes, BP response, and arrhythmias.

## 2022-08-29 NOTE — Assessment & Plan Note (Signed)
.    Heart fluttering sensation usually occurs with activity probably is related to be dehydrated.  Stressed importance of maintaining adequate hydration.  Did not want to do a beta-blocker because her resting heart of 56 bpm.  Since most of her fluttering sensations have been with activity, we will start off with evaluating GXT.  However if that is not indicative of any obvious diagnoses, could consider short term event monitor.

## 2022-08-29 NOTE — Assessment & Plan Note (Signed)
She has elevated diastolic pressures more so than systolic pressures.  In general we would like to consider using a diuretic to treat diastolic pressures, however with her tendency for dehydration and lack of adequate hydration during physical activity reluctant to put her on a diuretic.  Will start low-dose losartan 25 mg daily. We can recheck chemistry panel at follow-up visit.

## 2022-09-16 ENCOUNTER — Ambulatory Visit: Payer: BC Managed Care – PPO | Admitting: Physician Assistant

## 2022-09-16 ENCOUNTER — Encounter: Payer: Self-pay | Admitting: Physician Assistant

## 2022-09-16 DIAGNOSIS — K625 Hemorrhage of anus and rectum: Secondary | ICD-10-CM | POA: Insufficient documentation

## 2022-09-16 DIAGNOSIS — F17200 Nicotine dependence, unspecified, uncomplicated: Secondary | ICD-10-CM | POA: Diagnosis not present

## 2022-09-16 DIAGNOSIS — F419 Anxiety disorder, unspecified: Secondary | ICD-10-CM

## 2022-09-16 DIAGNOSIS — K648 Other hemorrhoids: Secondary | ICD-10-CM | POA: Insufficient documentation

## 2022-09-16 DIAGNOSIS — M25562 Pain in left knee: Secondary | ICD-10-CM

## 2022-09-16 DIAGNOSIS — I10 Essential (primary) hypertension: Secondary | ICD-10-CM

## 2022-09-16 DIAGNOSIS — G8929 Other chronic pain: Secondary | ICD-10-CM | POA: Insufficient documentation

## 2022-09-16 DIAGNOSIS — Z7689 Persons encountering health services in other specified circumstances: Secondary | ICD-10-CM

## 2022-09-16 DIAGNOSIS — F32A Depression, unspecified: Secondary | ICD-10-CM | POA: Insufficient documentation

## 2022-09-16 NOTE — Progress Notes (Signed)
New patient visit  Patient: Bonnie Shepard   DOB: 08-18-1985   37 y.o. Female  MRN: [id number] Visit Date: 09/16/2022  Today's healthcare provider: Debera Lat, PA-C   Chief Complaint  Patient presents with   Establish Care   Subjective    Bonnie Shepard is a 37 y.o. female who presents today as a new patient to establish care.   Discussed the use of AI scribe software for clinical note transcription with the patient, who gave verbal consent to proceed.  History of Present Illness   The patient, a Research scientist (physical sciences), is establishing care after a period of being uninsured. She has a history of hypertension, seasonal allergies, gastroesophageal reflux disease, and depression. She is currently under the care of a cardiologist, podiatrist, and gastroenterologist.  The patient's primary concern is knee pain, which has been worsening over the past six months. She injured her knee two years ago and has been using a brace. Despite the pain, she continues to play softball five days a week. The pain has been gradually increasing, and she now requires assistance to lift her leg.  The patient was recently diagnosed with hypertension and has been on losartan for two weeks. This diagnosis was made by a cardiologist, who she was referred to after presenting to an urgent care with high blood pressure.  The patient also has a history of depression and was previously on medication. However, she stopped taking the medication due to concerns about side effects and difficulty remembering to take it. She expresses a preference for talking to someone about her depression before considering medication again.  The patient is a smoker but has been trying to cut back. She reports smoking three to four cigarettes a day. 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  The patient also  reports having bloody stools, which she believes are due to hemorrhoids. She has been trying to get an appointment with her gastroenterologist for the past two months without success.        Past Medical History:  Diagnosis Date   Acid reflux    Asthma    Diverticulitis    Family history of adverse reaction to anesthesia    Mother - PONV   Hemorrhoids    Hypertension    Infection    freq UTI   Vaginal Pap smear, abnormal    f/u wnl   Wears contact lenses    Past Surgical History:  Procedure Laterality Date   COLONOSCOPY WITH PROPOFOL N/A 04/03/2019   Procedure: COLONOSCOPY WITH PROPOFOL;  Surgeon: Midge Minium, MD;  Location: [facility];  Service: Endoscopy;  Laterality: N/A;  Priority 3   DILATION AND EVACUATION N/A 09/07/2013   Procedure: DILATATION AND EVACUATION;  Surgeon: Freddrick March. Tenny Craw, MD;  Location: [facility];  Service: Gynecology;  Laterality: N/A;   ESOPHAGOGASTRODUODENOSCOPY (EGD) WITH PROPOFOL N/A 04/04/2020   Procedure: ESOPHAGOGASTRODUODENOSCOPY (EGD) WITH BIOPSY;  Surgeon: Midge Minium, MD;  Location: [facility];  Service: Endoscopy;  Laterality: N/A;   TONSILLECTOMY     Family Status  Relation Name Status   Mother  Alive   Father  Alive   MGM  (Not Specified)   MGF  Deceased   PGM  (Not Specified)   PGF  (Not Specified)   Other  (Not Specified)   Neg Hx  (Not Specified)  No partnership data on  file   Family History  Problem Relation Age of Onset   Hypertension Mother    Clotting disorder Mother    Diabetes Father    Hypertension Father    Stroke Maternal Grandmother    COPD Maternal Grandmother    Hypertension Maternal Grandmother    Heart disease Maternal Grandmother    Heart disease Maternal Grandfather    Heart attack Maternal Grandfather    Arthritis Paternal Grandmother    Alzheimer's disease Paternal Grandmother    Heart disease Paternal Grandfather    Hypertension Other    Cancer Other    Hearing loss Neg Hx    Social  History   Socioeconomic History   Marital status: Single    Spouse name: Not on file   Number of children: Not on file   Years of education: Not on file   Highest education level: Not on file  Occupational History   Not on file  Tobacco Use   Smoking status: Every Day    Current packs/day: 0.25    Average packs/day: 0.3 packs/day for 16.0 years (4.0 ttl pk-yrs)    Types: Cigarettes   Smokeless tobacco: Never   Tobacco comments:    since age 27  Vaping Use   Vaping status: Some Days  Substance and Sexual Activity   Alcohol use: Yes    Alcohol/week: 1.0 standard drink of alcohol    Types: 1 Cans of beer per week    Comment: occasionally   Drug use: No   Sexual activity: Not Currently    Birth control/protection: None  Other Topics Concern   Not on file  Social History Narrative   Not on file   Social Determinants of Health   Financial Resource Strain: Not on file  Food Insecurity: Not on file  Transportation Needs: Not on file  Physical Activity: Not on file  Stress: Not on file  Social Connections: Not on file   Outpatient Medications Prior to Visit  Medication Sig   albuterol (VENTOLIN HFA) 108 (90 Base) MCG/ACT inhaler Inhale 2 puffs into the lungs every 6 (six) hours as needed for wheezing or shortness of breath.   azelastine (ASTELIN) 0.1 % nasal spray Place into the nose.   losartan (COZAAR) 25 MG tablet Take 1 tablet (25 mg total) by mouth daily.   omeprazole (PRILOSEC OTC) 20 MG tablet Take 1 tablet (20 mg total) by mouth daily.   [DISCONTINUED] fluticasone (FLONASE) 50 MCG/ACT nasal spray PLACE 2 SPRAYS INTO BOTH NOSTRILS DAILY.   No facility-administered medications prior to visit.   No Known Allergies  Immunization History  Administered Date(s) Administered   Influenza-Unspecified 09/11/2017   Tdap 09/18/2015    Health Maintenance  Topic Date Due   FOOT EXAM  Never done   Diabetic kidney evaluation - Urine ACR  10/15/2016   OPHTHALMOLOGY EXAM   06/12/2020   HEMOGLOBIN A1C  07/16/2020   PAP SMEAR-Modifier  02/20/2022   COVID-19 Vaccine (1 - 2023-24 season) Never done   INFLUENZA VACCINE  04/05/2023 (Originally 08/06/2022)   Diabetic kidney evaluation - eGFR measurement  04/28/2023   DTaP/Tdap/Td (2 - Td or Tdap) 09/17/2025   Hepatitis C Screening  Completed   HIV Screening  Completed   HPV VACCINES  Aged Out    Patient Care Team: Debera Lat, PA-C as PCP - General (Physician Assistant)  Review of Systems  All other systems reviewed and are negative.  Except see HPI       Objective  BP 127/87   Pulse (!) 45   Temp 98.2 F (36.8 C) (Oral)   Ht 5\' 3"  (1.6 m)   Wt 245 lb (111.1 kg)   SpO2 98%   BMI 43.40 kg/m     Physical Exam Vitals reviewed.  Constitutional:      General: She is not in acute distress.    Appearance: Normal appearance. She is well-developed. She is not diaphoretic.  HENT:     Head: Normocephalic and atraumatic.  Eyes:     General: No scleral icterus.    Conjunctiva/sclera: Conjunctivae normal.  Neck:     Thyroid: No thyromegaly.  Cardiovascular:     Rate and Rhythm: Normal rate and regular rhythm.     Pulses: Normal pulses.     Heart sounds: Normal heart sounds. No murmur heard. Pulmonary:     Effort: Pulmonary effort is normal. No respiratory distress.     Breath sounds: Normal breath sounds. No wheezing, rhonchi or rales.  Musculoskeletal:     Cervical back: Neck supple.     Right lower leg: No edema.     Left lower leg: No edema.  Lymphadenopathy:     Cervical: No cervical adenopathy.  Skin:    General: Skin is warm and dry.     Findings: No rash.  Neurological:     Mental Status: She is alert and oriented to person, place, and time. Mental status is at baseline.  Psychiatric:        Mood and Affect: Mood normal.        Behavior: Behavior normal.     Depression Screen    09/16/2022    9:32 AM 06/18/2020   11:19 AM 10/10/2019    4:06 PM 01/19/2019    3:42 PM  PHQ  2/9 Scores  PHQ - 2 Score 1 0 0 1  PHQ- 9 Score 12  4 9    No results found for any visits on 09/16/22.  Assessment & Plan         Knee Pain Chronic knee pain for approximately 6 months, exacerbated by frequent softball playing. Reduced mobility noted. -Refer to sports medicine for further evaluation and management.  Hypertension Chronic and stable Recently diagnosed and started on Losartan for two weeks by a cardiologist. -Continue Losartan 25mg  as prescribed. Continue low salt diet and daily exercise  Anxiety/Depression History of depression, previously on medication but discontinued due to side effects and non-adherence. Currently experiencing some depressive symptoms. -Refer to counseling for further evaluation and management. Will refer to [facility] if symptoms worsen  Smoking X 18 years Current smoker, attempting to reduce daily cigarette consumption. 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  Rectal Bleeding Reports of rectal bleeding approximately once or twice weekly, suspected hemorrhoids. Last seen by gastroenterologist approximately a year ago. -Advise patient to continue attempts to schedule follow-up with gastroenterologist. If unsuccessful, consider placing a referral.  General Health Maintenance -Order comprehensive lab work today to assess overall health status, given recent hypertension diagnosis and elevated blood sugar noted in previous hospital visit. -Schedule Pap smear, last performed approximately 2-3 years ago. -Schedule physical exam in one month to revisit current issues and assess overall health status.     Encounter to establish care Welcomed to our clinic Reviewed past medical hx, social hx, family hx and surgical hx Pt advised to send all vaccination records or screening   Return in about 4 weeks (around 10/14/2022) for CPE.    The patient was advised  to call back or seek an in-person evaluation if the symptoms worsen or  if the condition fails to improve as anticipated.  I discussed the assessment and treatment plan with the patient. The patient was provided an opportunity to ask questions and all were answered. The patient agreed with the plan and demonstrated an understanding of the instructions.  I, Debera Lat, PA-C have reviewed all documentation for this visit. The documentation on  09/16/22  for the exam, diagnosis, procedures, and orders are all accurate and complete.  Debera Lat, [facility], MMS [facility] [phone] (phone) [phone] (fax)  [facility]

## 2022-09-17 LAB — CBC WITH DIFFERENTIAL/PLATELET
Basophils Absolute: 0 10*3/uL (ref 0.0–0.2)
Basos: 1 %
EOS (ABSOLUTE): 0.1 10*3/uL (ref 0.0–0.4)
Eos: 1 %
Hematocrit: 41.9 % (ref 34.0–46.6)
Hemoglobin: 13.9 g/dL (ref 11.1–15.9)
Immature Grans (Abs): 0 10*3/uL (ref 0.0–0.1)
Immature Granulocytes: 0 %
Lymphocytes Absolute: 2.5 10*3/uL (ref 0.7–3.1)
Lymphs: 36 %
MCH: 31 pg (ref 26.6–33.0)
MCHC: 33.2 g/dL (ref 31.5–35.7)
MCV: 93 fL (ref 79–97)
Monocytes Absolute: 0.4 10*3/uL (ref 0.1–0.9)
Monocytes: 6 %
Neutrophils Absolute: 4 10*3/uL (ref 1.4–7.0)
Neutrophils: 56 %
Platelets: 273 10*3/uL (ref 150–450)
RBC: 4.49 x10E6/uL (ref 3.77–5.28)
RDW: 11.9 % (ref 11.7–15.4)
WBC: 7.1 10*3/uL (ref 3.4–10.8)

## 2022-09-17 LAB — COMPREHENSIVE METABOLIC PANEL
ALT: 57 IU/L — ABNORMAL HIGH (ref 0–32)
AST: 37 IU/L (ref 0–40)
Albumin: 4.2 g/dL (ref 3.9–4.9)
Alkaline Phosphatase: 107 IU/L (ref 44–121)
BUN/Creatinine Ratio: 21 (ref 9–23)
BUN: 15 mg/dL (ref 6–20)
Bilirubin Total: 0.3 mg/dL (ref 0.0–1.2)
CO2: 21 mmol/L (ref 20–29)
Calcium: 9.1 mg/dL (ref 8.7–10.2)
Chloride: 105 mmol/L (ref 96–106)
Creatinine, Ser: 0.73 mg/dL (ref 0.57–1.00)
Globulin, Total: 2.8 g/dL (ref 1.5–4.5)
Glucose: 108 mg/dL — ABNORMAL HIGH (ref 70–99)
Potassium: 4.5 mmol/L (ref 3.5–5.2)
Sodium: 139 mmol/L (ref 134–144)
Total Protein: 7 g/dL (ref 6.0–8.5)
eGFR: 109 mL/min/{1.73_m2} (ref >59)

## 2022-09-17 LAB — HEMOGLOBIN A1C
Est. average glucose Bld gHb Est-mCnc: 123 mg/dL
Hgb A1c MFr Bld: 5.9 % — ABNORMAL HIGH (ref 4.8–5.6)

## 2022-09-17 LAB — LIPID PANEL
Chol/HDL Ratio: 4.7 ratio — ABNORMAL HIGH (ref 0.0–4.4)
Cholesterol, Total: 178 mg/dL (ref 100–199)
HDL: 38 mg/dL — ABNORMAL LOW (ref >39)
LDL Chol Calc (NIH): 126 mg/dL — ABNORMAL HIGH (ref 0–99)
Triglycerides: 72 mg/dL (ref 0–149)
VLDL Cholesterol Cal: 14 mg/dL (ref 5–40)

## 2022-09-17 LAB — TSH: TSH: 1.78 u[IU]/mL (ref 0.450–4.500)

## 2022-09-18 DIAGNOSIS — M5416 Radiculopathy, lumbar region: Secondary | ICD-10-CM | POA: Diagnosis not present

## 2022-09-22 ENCOUNTER — Ambulatory Visit
Admission: RE | Admit: 2022-09-22 | Discharge: 2022-09-22 | Disposition: A | Discharge status: Home / Self Care | Payer: BC Managed Care – PPO | Source: Ambulatory Visit | Attending: Cardiology | Admitting: Cardiology

## 2022-09-22 DIAGNOSIS — I498 Other specified cardiac arrhythmias: Secondary | ICD-10-CM | POA: Insufficient documentation

## 2022-09-22 DIAGNOSIS — R079 Chest pain, unspecified: Secondary | ICD-10-CM | POA: Insufficient documentation

## 2022-09-23 LAB — EXERCISE TOLERANCE TEST
Angina Index: 0
Estimated workload: 8.6
Exercise duration (min): 7 min
Exercise duration (sec): 18 s
MPHR: 183 {beats}/min
Peak HR: 169 {beats}/min
Percent HR: 92 %
Rest HR: 54 {beats}/min

## 2022-10-09 ENCOUNTER — Encounter: Payer: Self-pay | Admitting: Nurse Practitioner

## 2022-10-09 ENCOUNTER — Ambulatory Visit: Payer: BC Managed Care – PPO | Attending: Nurse Practitioner | Admitting: Nurse Practitioner

## 2022-10-09 VITALS — BP 118/80 | HR 76 | Ht 62.0 in | Wt 243.2 lb

## 2022-10-09 DIAGNOSIS — I10 Essential (primary) hypertension: Secondary | ICD-10-CM | POA: Diagnosis not present

## 2022-10-09 DIAGNOSIS — Z720 Tobacco use: Secondary | ICD-10-CM

## 2022-10-09 DIAGNOSIS — R002 Palpitations: Secondary | ICD-10-CM | POA: Diagnosis not present

## 2022-10-09 DIAGNOSIS — R0789 Other chest pain: Secondary | ICD-10-CM | POA: Diagnosis not present

## 2022-10-09 NOTE — Progress Notes (Signed)
Office Visit    Patient Name: Bonnie Shepard Date of Encounter: 10/09/2022  Primary Care Provider:  Debera Lat, PA-C Primary Cardiologist:  Bryan Lemma, MD  Chief Complaint    37 y.o. female with a history of hypertension, GERD, tobacco abuse, and asthma, presents for follow-up after recent stress test in the setting of atypical chest pain.  Past Medical History    Past Medical History:  Diagnosis Date   Acid reflux    Asthma    Chest pain    a. 08/2022 ETT: Ex time 7:18. Max HR 169. No acute ST/T changes.   Diverticulitis    Family history of adverse reaction to anesthesia    Mother - PONV   Hemorrhoids    History of echocardiogram    a. 04/2020 Echo: EF 60-65%, no rwma, nl RV size/fxn.   Hypertension    Infection    freq UTI   Palpitations    Tobacco abuse    Vaginal Pap smear, abnormal    f/u wnl   Wears contact lenses    Past Surgical History:  Procedure Laterality Date   COLONOSCOPY WITH PROPOFOL N/A 04/03/2019   Procedure: COLONOSCOPY WITH PROPOFOL;  Surgeon: Midge Minium, MD;  Location: [facility];  Service: Endoscopy;  Laterality: N/A;  Priority 3   DILATION AND EVACUATION N/A 09/07/2013   Procedure: DILATATION AND EVACUATION;  Surgeon: Freddrick March. Tenny Craw, MD;  Location: [facility];  Service: Gynecology;  Laterality: N/A;   ESOPHAGOGASTRODUODENOSCOPY (EGD) WITH PROPOFOL N/A 04/04/2020   Procedure: ESOPHAGOGASTRODUODENOSCOPY (EGD) WITH BIOPSY;  Surgeon: Midge Minium, MD;  Location: [facility];  Service: Endoscopy;  Laterality: N/A;   TONSILLECTOMY      Allergies  No Known Allergies  History of Present Illness      37 y.o. y/o female with a history of hypertension, GERD, tobacco abuse, and asthma.  She previously underwent echocardiography in April 2022, which showed an EF of 60 to 65% without significant valvular disease.  She established care with Dr. Herbie Baltimore in August 2024 in the setting of hypertension and atypical chest pain, palpitations,  and lightheadedness.  She was placed on losartan therapy and subsequently underwent exercise treadmill testing, where she walked for 7 minutes and 18 seconds to a maximal rate of 160 bpm without acute ST or T changes.   Since her last visit, she has done well.  She continues to play softball 4-5 nights per week and with the change in weather, she has noted improved activity tolerance.  She has not been experiencing any chest pain or palpitations.  She continues to smoke but is down to 3 cigarettes/day.  She is motivated to quit and make other lifestyle changes.  Regarding her diet, she notes that she snacks quite a bit on beef sticks and crackers.  She is aware that this is not ideal, and admits to not eating very many vegetables.  She denies dyspnea, PND, orthopnea, dizziness, syncope, edema, or early satiety.  Home Medications    Current Outpatient Medications  Medication Sig Dispense Refill   albuterol (VENTOLIN HFA) 108 (90 Base) MCG/ACT inhaler Inhale 2 puffs into the lungs every 6 (six) hours as needed for wheezing or shortness of breath. 8 g 2   azelastine (ASTELIN) 0.1 % nasal spray Place into the nose.     losartan (COZAAR) 25 MG tablet Take 1 tablet (25 mg total) by mouth daily. 90 tablet 3   omeprazole (PRILOSEC OTC) 20 MG tablet Take 1 tablet (20  mg total) by mouth daily. 28 tablet 1   No current facility-administered medications for this visit.     Review of Systems    She denies chest pain, palpitations, dyspnea, pnd, orthopnea, n, v, dizziness, syncope, edema, weight gain, or early satiety.  All other systems reviewed and are otherwise negative except as noted above.    Physical Exam    VS:  BP 118/80 (BP Location: Left Arm, Patient Position: Sitting, Cuff Size: Normal)   Pulse 76   Ht 5\' 2"  (1.575 m)   Wt 243 lb 4 oz (110.3 kg)   SpO2 98%   BMI 44.49 kg/m  , BMI Body mass index is 44.49 kg/m.     GEN: Well nourished, well developed, in no acute distress. HEENT:  normal. Neck: Supple, no JVD, carotid bruits, or masses. Cardiac: RRR, no murmurs, rubs, or gallops. No clubbing, cyanosis, edema.  Radials 2+/PT 2+ and equal bilaterally.  Respiratory:  Respirations regular and unlabored, clear to auscultation bilaterally. GI: Soft, nontender, nondistended, BS + x 4. MS: no deformity or atrophy. Skin: warm and dry, no rash. Neuro:  Strength and sensation are intact. Psych: Normal affect.  Accessory Clinical Findings    Lab Results  Component Value Date   WBC 7.1 09/16/2022   HGB 13.9 09/16/2022   HCT 41.9 09/16/2022   MCV 93 09/16/2022   PLT 273 09/16/2022   Lab Results  Component Value Date   CREATININE 0.73 09/16/2022   BUN 15 09/16/2022   NA 139 09/16/2022   K 4.5 09/16/2022   CL 105 09/16/2022   CO2 21 09/16/2022   Lab Results  Component Value Date   ALT 57 (H) 09/16/2022   AST 37 09/16/2022   ALKPHOS 107 09/16/2022   BILITOT 0.3 09/16/2022   Lab Results  Component Value Date   CHOL 178 09/16/2022   HDL 38 (L) 09/16/2022   LDLCALC 126 (H) 09/16/2022   TRIG 72 09/16/2022   CHOLHDL 4.7 (H) 09/16/2022    Lab Results  Component Value Date   HGBA1C 5.9 (H) 09/16/2022    Assessment & Plan    1.  Primary hypertension: Blood pressure now normal on losartan therapy.  Normal renal function electrolytes on September 11.  Continue current dose of losartan.  2.  Atypical chest pain: Recent exercise treadmill test, where she was able to walk for 7 minutes 19 seconds without acute ST or T changes.  No recurrent chest pain or dyspnea.  Reassurance provided.  Discussed the importance of lifestyle modifications to reduce risks including weight loss and tobacco cessation.  3.  Palpitations: Quiescent over the past several months.  In that setting, we have deferred monitoring.  4.  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  We  discussed the importance of regular, moderately vigorous exercise for at least 30 minutes, at least 5 times per week.  We also discussed her diet, which is high in processed meats and crackers.  I encouraged her to increase her intake of vegetables, fruits, legumes, and complex fibers to at least 75% of her diet.  She is motivated to make lifestyle changes.  5.  Tobacco abuse: Smoking 3 cigarettes/day.  She recognizes the role of smoking on her dyspnea and activity tolerance.  She is motivated to quit.  Complete cessation advised.  6.  Disposition: Follow-up as needed.  Cristal Deer  Brion Aliment, NP 10/09/2022, 8:48 AM

## 2022-10-09 NOTE — Patient Instructions (Signed)
Medication Instructions:  No changes *If you need a refill on your cardiac medications before your next appointment, please call your pharmacy*   Lab Work: None ordered If you have labs (blood work) drawn today and your tests are completely normal, you will receive your results only by: MyChart Message (if you have MyChart) OR A paper copy in the mail If you have any lab test that is abnormal or we need to change your treatment, we will call you to review the results.   Testing/Procedures: None ordered   Follow-Up: At [facility], you and your health needs are our priority.  As part of our continuing mission to provide you with exceptional heart care, we have created designated Provider Care Teams.  These Care Teams include your primary Cardiologist (physician) and Advanced Practice Providers (APPs -  Physician Assistants and Nurse Practitioners) who all work together to provide you with the care you need, when you need it.  We recommend signing up for the patient portal called "MyChart".  Sign up information is provided on this After Visit Summary.  MyChart is used to connect with patients for Virtual Visits (Telemedicine).  Patients are able to view lab/test results, encounter notes, upcoming appointments, etc.  Non-urgent messages can be sent to your provider as well.   To learn more about what you can do with MyChart, go to [email or web address].    Your next appointment:   Follow up as needed  

## 2022-10-14 ENCOUNTER — Encounter: Payer: BC Managed Care – PPO | Admitting: Physician Assistant

## 2022-10-21 ENCOUNTER — Encounter: Payer: BC Managed Care – PPO | Admitting: Physician Assistant

## 2022-10-21 DIAGNOSIS — M654 Radial styloid tenosynovitis [de Quervain]: Secondary | ICD-10-CM | POA: Diagnosis not present

## 2022-10-21 NOTE — Progress Notes (Deleted)
Complete physical exam  Patient: Bonnie Shepard   DOB: 15-Sep-1985   37 y.o. Female  MRN: [id number] Visit Date: 10/21/2022  Today's healthcare provider: Debera Lat, PA-C   No chief complaint on file.  Subjective    Bonnie Shepard is a 37 y.o. female who presents today for a complete physical exam.  She reports consuming a {diet types:17450} diet. {Exercise:19826} She generally feels {well/fairly well/poorly:18703}. She reports sleeping {well/fairly well/poorly:18703}. She {does/does not:200015} have additional problems to discuss today.  HPI  *** Discussed the use of AI scribe software for clinical note transcription with the patient, who gave verbal consent to proceed.  History of Present Illness            Last depression screening scores    09/16/2022    9:32 AM 06/18/2020   11:19 AM 10/10/2019    4:06 PM  PHQ 2/9 Scores  PHQ - 2 Score 1 0 0  PHQ- 9 Score 12  4   Last fall risk screening    09/16/2022    9:31 AM  Fall Risk   Falls in the past year? 0  Number falls in past yr: 0  Injury with Fall? 0   Last Audit-C alcohol use screening    01/19/2019    3:41 PM  Alcohol Use Disorder Test (AUDIT)  1. How often do you have a drink containing alcohol? 1  2. How many drinks containing alcohol do you have on a typical day when you are drinking? 0  3. How often do you have six or more drinks on one occasion? 0  AUDIT-C Score 1   A score of 3 or more in women, and 4 or more in men indicates increased risk for alcohol abuse, EXCEPT if all of the points are from question 1   Past Medical History:  Diagnosis Date   Acid reflux    Asthma    Chest pain    a. 08/2022 ETT: Ex time 7:18. Max HR 169. No acute ST/T changes.   Diverticulitis    Family history of adverse reaction to anesthesia    Mother - PONV   Hemorrhoids    History of echocardiogram    a. 04/2020 Echo: EF 60-65%, no rwma, nl RV size/fxn.   Hypertension    Infection    freq UTI   Palpitations     Tobacco abuse    Vaginal Pap smear, abnormal    f/u wnl   Wears contact lenses    Past Surgical History:  Procedure Laterality Date   COLONOSCOPY WITH PROPOFOL N/A 04/03/2019   Procedure: COLONOSCOPY WITH PROPOFOL;  Surgeon: Midge Minium, MD;  Location: [facility];  Service: Endoscopy;  Laterality: N/A;  Priority 3   DILATION AND EVACUATION N/A 09/07/2013   Procedure: DILATATION AND EVACUATION;  Surgeon: Freddrick March. Tenny Craw, MD;  Location: [facility];  Service: Gynecology;  Laterality: N/A;   ESOPHAGOGASTRODUODENOSCOPY (EGD) WITH PROPOFOL N/A 04/04/2020   Procedure: ESOPHAGOGASTRODUODENOSCOPY (EGD) WITH BIOPSY;  Surgeon: Midge Minium, MD;  Location: [facility];  Service: Endoscopy;  Laterality: N/A;   TONSILLECTOMY     Social History   Socioeconomic History   Marital status: Single    Spouse name: Not on file   Number of children: Not on file   Years of education: Not on file   Highest education level: Not on file  Occupational History   Not on file  Tobacco Use   Smoking status: Some Days  Current packs/day: 0.25    Average packs/day: 0.3 packs/day for 16.0 years (4.0 ttl pk-yrs)    Types: Cigarettes   Smokeless tobacco: Never   Tobacco comments:    since age 79    Smokes 3 cigarettes daily;10/09/2022   Vaping Use   Vaping status: Some Days  Substance and Sexual Activity   Alcohol use: Yes    Alcohol/week: 1.0 standard drink of alcohol    Types: 1 Cans of beer per week    Comment: occasionally   Drug use: No   Sexual activity: Not Currently    Birth control/protection: None  Other Topics Concern   Not on file  Social History Narrative   Not on file   Social Determinants of Health   Financial Resource Strain: Not on file  Food Insecurity: Not on file  Transportation Needs: Not on file  Physical Activity: Not on file  Stress: Not on file  Social Connections: Not on file  Intimate Partner Violence: Not on file   Family Status  Relation Name Status    Mother  Alive   Father  Alive   MGM  (Not Specified)   MGF  Deceased   PGM  (Not Specified)   PGF  (Not Specified)   Other  (Not Specified)   Neg Hx  (Not Specified)  No partnership data on file   Family History  Problem Relation Age of Onset   Hypertension Mother    Clotting disorder Mother    Diabetes Father    Hypertension Father    Stroke Maternal Grandmother    COPD Maternal Grandmother    Hypertension Maternal Grandmother    Heart disease Maternal Grandmother    Heart disease Maternal Grandfather    Heart attack Maternal Grandfather    Arthritis Paternal Grandmother    Alzheimer's disease Paternal Grandmother    Heart disease Paternal Grandfather    Hypertension Other    Cancer Other    Hearing loss Neg Hx    No Known Allergies  Patient Care Team: Debera Lat, PA-C as PCP - General (Physician Assistant) Marykay Lex, MD as PCP - Cardiology (Cardiology)   Medications: Outpatient Medications Prior to Visit  Medication Sig   albuterol (VENTOLIN HFA) 108 (90 Base) MCG/ACT inhaler Inhale 2 puffs into the lungs every 6 (six) hours as needed for wheezing or shortness of breath.   azelastine (ASTELIN) 0.1 % nasal spray Place into the nose.   losartan (COZAAR) 25 MG tablet Take 1 tablet (25 mg total) by mouth daily.   omeprazole (PRILOSEC OTC) 20 MG tablet Take 1 tablet (20 mg total) by mouth daily.   No facility-administered medications prior to visit.    Review of Systems Except see HPI  {Insert previous labs (optional):23779} {See past labs  Heme  Chem  Endocrine  Serology  Results Review (optional):1}  Objective    There were no vitals taken for this visit. {Insert last BP/Wt (optional):23777}{See vitals history (optional):1}    Physical Exam   No results found for any visits on 10/21/22.  Assessment & Plan    Routine Health Maintenance and Physical Exam  Exercise Activities and Dietary recommendations  Goals   None     Immunization  History  Administered Date(s) Administered   Influenza-Unspecified 09/11/2017   Tdap 09/18/2015    Health Maintenance  Topic Date Due   Complete foot exam   Never done   Yearly kidney health urinalysis for diabetes  10/15/2016   Eye exam for diabetics  06/12/2020   COVID-19 Vaccine (1 - 2023-24 season) Never done   Flu Shot  04/05/2023*   Hemoglobin A1C  03/16/2023   Yearly kidney function blood test for diabetes  09/16/2023   Pap with HPV screening  02/21/2024   DTaP/Tdap/Td vaccine (2 - Td or Tdap) 09/17/2025   Hepatitis C Screening  Completed   HIV Screening  Completed   HPV Vaccine  Aged Out  *Topic was postponed. The date shown is not the original due date.    Discussed health benefits of physical activity, and encouraged her to engage in regular exercise appropriate for her age and condition.  Assessment and Plan              ***  No follow-ups on file.     [facility]

## 2022-10-30 DIAGNOSIS — M25562 Pain in left knee: Secondary | ICD-10-CM | POA: Diagnosis not present

## 2022-10-30 DIAGNOSIS — M25532 Pain in left wrist: Secondary | ICD-10-CM | POA: Diagnosis not present

## 2022-11-13 NOTE — Patient Instructions (Incomplete)
   Please provide name of eye care provider-eye exam is due  Recommend Covid vaccine at pharmacy

## 2022-11-15 NOTE — Progress Notes (Unsigned)
Complete physical exam  Patient: Bonnie Shepard   DOB: 12-06-1985   37 y.o. Female  MRN: [id number] Visit Date: 11/16/2022  Today's healthcare provider: Debera Lat, PA-C   No chief complaint on file.  Subjective    Bonnie Shepard is a 37 y.o. female who presents today for a complete physical exam.  She reports consuming a {diet types:17450} diet. {Exercise:19826} She generally feels {well/fairly well/poorly:18703}. She reports sleeping {well/fairly well/poorly:18703}. She {does/does not:200015} have additional problems to discuss today.  HPI  *** Discussed the use of AI scribe software for clinical note transcription with the patient, who gave verbal consent to proceed.  History of Present Illness            Last depression screening scores    09/16/2022    9:32 AM 06/18/2020   11:19 AM 10/10/2019    4:06 PM  PHQ 2/9 Scores  PHQ - 2 Score 1 0 0  PHQ- 9 Score 12  4   Last fall risk screening    09/16/2022    9:31 AM  Fall Risk   Falls in the past year? 0  Number falls in past yr: 0  Injury with Fall? 0   Last Audit-C alcohol use screening    01/19/2019    3:41 PM  Alcohol Use Disorder Test (AUDIT)  1. How often do you have a drink containing alcohol? 1  2. How many drinks containing alcohol do you have on a typical day when you are drinking? 0  3. How often do you have six or more drinks on one occasion? 0  AUDIT-C Score 1   A score of 3 or more in women, and 4 or more in men indicates increased risk for alcohol abuse, EXCEPT if all of the points are from question 1   Past Medical History:  Diagnosis Date   Acid reflux    Asthma    Chest pain    a. 08/2022 ETT: Ex time 7:18. Max HR 169. No acute ST/T changes.   Diverticulitis    Family history of adverse reaction to anesthesia    Mother - PONV   Hemorrhoids    History of echocardiogram    a. 04/2020 Echo: EF 60-65%, no rwma, nl RV size/fxn.   Hypertension    Infection    freq UTI   Palpitations     Tobacco abuse    Vaginal Pap smear, abnormal    f/u wnl   Wears contact lenses    Past Surgical History:  Procedure Laterality Date   COLONOSCOPY WITH PROPOFOL N/A 04/03/2019   Procedure: COLONOSCOPY WITH PROPOFOL;  Surgeon: Midge Minium, MD;  Location: [facility];  Service: Endoscopy;  Laterality: N/A;  Priority 3   DILATION AND EVACUATION N/A 09/07/2013   Procedure: DILATATION AND EVACUATION;  Surgeon: Freddrick March. Tenny Craw, MD;  Location: [facility];  Service: Gynecology;  Laterality: N/A;   ESOPHAGOGASTRODUODENOSCOPY (EGD) WITH PROPOFOL N/A 04/04/2020   Procedure: ESOPHAGOGASTRODUODENOSCOPY (EGD) WITH BIOPSY;  Surgeon: Midge Minium, MD;  Location: [facility];  Service: Endoscopy;  Laterality: N/A;   TONSILLECTOMY     Social History   Socioeconomic History   Marital status: Single    Spouse name: Not on file   Number of children: Not on file   Years of education: Not on file   Highest education level: Not on file  Occupational History   Not on file  Tobacco Use   Smoking status: Some Days  Current packs/day: 0.25    Average packs/day: 0.3 packs/day for 16.0 years (4.0 ttl pk-yrs)    Types: Cigarettes   Smokeless tobacco: Never   Tobacco comments:    since age 35    Smokes 3 cigarettes daily;10/09/2022   Vaping Use   Vaping status: Some Days  Substance and Sexual Activity   Alcohol use: Yes    Alcohol/week: 1.0 standard drink of alcohol    Types: 1 Cans of beer per week    Comment: occasionally   Drug use: No   Sexual activity: Not Currently    Birth control/protection: None  Other Topics Concern   Not on file  Social History Narrative   Not on file   Social Determinants of Health   Financial Resource Strain: Not on file  Food Insecurity: Not on file  Transportation Needs: Not on file  Physical Activity: Not on file  Stress: Not on file  Social Connections: Not on file  Intimate Partner Violence: Not on file   Family Status  Relation Name Status    Mother  Alive   Father  Alive   MGM  (Not Specified)   MGF  Deceased   PGM  (Not Specified)   PGF  (Not Specified)   Other  (Not Specified)   Neg Hx  (Not Specified)  No partnership data on file   Family History  Problem Relation Age of Onset   Hypertension Mother    Clotting disorder Mother    Diabetes Father    Hypertension Father    Stroke Maternal Grandmother    COPD Maternal Grandmother    Hypertension Maternal Grandmother    Heart disease Maternal Grandmother    Heart disease Maternal Grandfather    Heart attack Maternal Grandfather    Arthritis Paternal Grandmother    Alzheimer's disease Paternal Grandmother    Heart disease Paternal Grandfather    Hypertension Other    Cancer Other    Hearing loss Neg Hx    No Known Allergies  Patient Care Team: Debera Lat, PA-C as PCP - General (Physician Assistant) Marykay Lex, MD as PCP - Cardiology (Cardiology)   Medications: Outpatient Medications Prior to Visit  Medication Sig   albuterol (VENTOLIN HFA) 108 (90 Base) MCG/ACT inhaler Inhale 2 puffs into the lungs every 6 (six) hours as needed for wheezing or shortness of breath.   azelastine (ASTELIN) 0.1 % nasal spray Place into the nose.   losartan (COZAAR) 25 MG tablet Take 1 tablet (25 mg total) by mouth daily.   omeprazole (PRILOSEC OTC) 20 MG tablet Take 1 tablet (20 mg total) by mouth daily.   No facility-administered medications prior to visit.    Review of Systems Except see HPI  {Insert previous labs (optional):23779} {See past labs  Heme  Chem  Endocrine  Serology  Results Review (optional):1}  Objective    There were no vitals taken for this visit. {Insert last BP/Wt (optional):23777}{See vitals history (optional):1}    Physical Exam   No results found for any visits on 11/16/22.  Assessment & Plan    Routine Health Maintenance and Physical Exam  Exercise Activities and Dietary recommendations  Goals   None     Immunization  History  Administered Date(s) Administered   Influenza-Unspecified 09/11/2017   Tdap 09/18/2015    Health Maintenance  Topic Date Due   Complete foot exam   Never done   Yearly kidney health urinalysis for diabetes  10/15/2016   Eye exam for diabetics  06/12/2020   COVID-19 Vaccine (1 - 2023-24 season) Never done   Flu Shot  04/05/2023*   Hemoglobin A1C  03/16/2023   Yearly kidney function blood test for diabetes  09/16/2023   Pap with HPV screening  02/21/2024   DTaP/Tdap/Td vaccine (2 - Td or Tdap) 09/17/2025   Hepatitis C Screening  Completed   HIV Screening  Completed   HPV Vaccine  Aged Out  *Topic was postponed. The date shown is not the original due date.    Discussed health benefits of physical activity, and encouraged her to engage in regular exercise appropriate for her age and condition.  Assessment and Plan 1. Annual physical exam UTD on dental/eye Things to do to keep yourself healthy  - Exercise at least 30-45 minutes a day, 3-4 days a week.  - Eat a low-fat diet with lots of fruits and vegetables, up to 7-9 servings per day.  - Seatbelts can save your life. Wear them always.  - Smoke detectors on every level of your home, check batteries every year.  - Eye Doctor - have an eye exam every 1-2 years  - Safe sex - if you may be exposed to STDs, use a condom.  - Alcohol -  If you drink, do it moderately, less than 2 drinks per day.  - Health Care Power of Attorney. Choose someone to speak for you if you are not able.  - Depression is common in our stressful world.If you're feeling down or losing interest in things you normally enjoy, please come in for a visit.  - Violence - If anyone is threatening or hurting you, please call immediately.    No follow-ups on file.    The patient was advised to call back or seek an in-person evaluation if the symptoms worsen or if the condition fails to improve as anticipated.  I discussed the assessment and treatment plan  with the patient. The patient was provided an opportunity to ask questions and all were answered. The patient agreed with the plan and demonstrated an understanding of the instructions.  I, Debera Lat, PA-C have reviewed all documentation for this visit. The documentation on  11/16/22  for the exam, diagnosis, procedures, and orders are all accurate and complete.  Debera Lat, [facility], MMS [facility] [phone] (phone) [phone] (fax)  [facility]

## 2022-11-16 ENCOUNTER — Ambulatory Visit (INDEPENDENT_AMBULATORY_CARE_PROVIDER_SITE_OTHER): Payer: BC Managed Care – PPO | Admitting: Physician Assistant

## 2022-11-16 VITALS — BP 132/81 | HR 65 | Resp 16 | Ht 62.0 in | Wt 245.0 lb

## 2022-11-16 DIAGNOSIS — K219 Gastro-esophageal reflux disease without esophagitis: Secondary | ICD-10-CM | POA: Diagnosis not present

## 2022-11-16 DIAGNOSIS — I10 Essential (primary) hypertension: Secondary | ICD-10-CM

## 2022-11-16 DIAGNOSIS — Z0001 Encounter for general adult medical examination with abnormal findings: Secondary | ICD-10-CM | POA: Diagnosis not present

## 2022-11-16 DIAGNOSIS — R7303 Prediabetes: Secondary | ICD-10-CM | POA: Diagnosis not present

## 2022-11-16 DIAGNOSIS — Z0000 Encounter for general adult medical examination without abnormal findings: Secondary | ICD-10-CM

## 2022-11-16 MED ORDER — OMEPRAZOLE MAGNESIUM 20 MG PO TBEC
20.0000 mg | DELAYED_RELEASE_TABLET | Freq: Every day | ORAL | 1 refills | Status: DC
Start: 2022-11-16 — End: 2023-11-03

## 2022-11-16 MED ORDER — LOSARTAN POTASSIUM 25 MG PO TABS: 25.0000 mg | ORAL_TABLET | Freq: Every day | ORAL | 3 refills | Status: DC

## 2022-11-24 DIAGNOSIS — M25532 Pain in left wrist: Secondary | ICD-10-CM | POA: Diagnosis not present

## 2023-01-20 ENCOUNTER — Encounter (INDEPENDENT_AMBULATORY_CARE_PROVIDER_SITE_OTHER): Payer: Self-pay

## 2023-01-21 DIAGNOSIS — N6322 Unspecified lump in the left breast, upper inner quadrant: Secondary | ICD-10-CM | POA: Diagnosis not present

## 2023-01-21 DIAGNOSIS — N6489 Other specified disorders of breast: Secondary | ICD-10-CM | POA: Diagnosis not present

## 2023-01-21 DIAGNOSIS — N63 Unspecified lump in unspecified breast: Secondary | ICD-10-CM | POA: Diagnosis not present

## 2023-01-21 DIAGNOSIS — S299XXA Unspecified injury of thorax, initial encounter: Secondary | ICD-10-CM | POA: Diagnosis not present

## 2023-01-22 ENCOUNTER — Encounter: Payer: Self-pay | Admitting: Family Medicine

## 2023-01-22 ENCOUNTER — Other Ambulatory Visit: Payer: Self-pay | Admitting: Family Medicine

## 2023-01-22 DIAGNOSIS — N6489 Other specified disorders of breast: Secondary | ICD-10-CM

## 2023-01-22 DIAGNOSIS — N6322 Unspecified lump in the left breast, upper inner quadrant: Secondary | ICD-10-CM

## 2023-01-22 DIAGNOSIS — S299XXA Unspecified injury of thorax, initial encounter: Secondary | ICD-10-CM

## 2023-01-22 DIAGNOSIS — N63 Unspecified lump in unspecified breast: Secondary | ICD-10-CM

## 2023-02-02 ENCOUNTER — Other Ambulatory Visit: Payer: BC Managed Care – PPO

## 2023-02-02 ENCOUNTER — Ambulatory Visit
Admission: RE | Admit: 2023-02-02 | Discharge: 2023-02-02 | Disposition: A | Discharge status: Home / Self Care | Payer: BC Managed Care – PPO | Source: Ambulatory Visit | Attending: Family Medicine | Admitting: Family Medicine

## 2023-02-02 DIAGNOSIS — R92323 Mammographic fibroglandular density, bilateral breasts: Secondary | ICD-10-CM | POA: Diagnosis not present

## 2023-02-02 DIAGNOSIS — N6322 Unspecified lump in the left breast, upper inner quadrant: Secondary | ICD-10-CM | POA: Insufficient documentation

## 2023-02-02 DIAGNOSIS — N6489 Other specified disorders of breast: Secondary | ICD-10-CM

## 2023-02-02 DIAGNOSIS — N63 Unspecified lump in unspecified breast: Secondary | ICD-10-CM

## 2023-02-02 DIAGNOSIS — S299XXA Unspecified injury of thorax, initial encounter: Secondary | ICD-10-CM

## 2023-02-02 DIAGNOSIS — N6012 Diffuse cystic mastopathy of left breast: Secondary | ICD-10-CM | POA: Diagnosis not present

## 2023-02-02 DIAGNOSIS — N6002 Solitary cyst of left breast: Secondary | ICD-10-CM | POA: Diagnosis not present

## 2023-02-18 ENCOUNTER — Encounter (INDEPENDENT_AMBULATORY_CARE_PROVIDER_SITE_OTHER): Payer: Self-pay

## 2023-02-23 ENCOUNTER — Encounter: Payer: Self-pay | Admitting: Physician Assistant

## 2023-02-23 ENCOUNTER — Ambulatory Visit: Payer: BC Managed Care – PPO | Admitting: Physician Assistant

## 2023-02-23 VITALS — BP 133/95 | HR 58 | Resp 16 | Wt 240.0 lb

## 2023-02-23 DIAGNOSIS — I10 Essential (primary) hypertension: Secondary | ICD-10-CM

## 2023-02-23 DIAGNOSIS — K219 Gastro-esophageal reflux disease without esophagitis: Secondary | ICD-10-CM

## 2023-02-23 DIAGNOSIS — R7303 Prediabetes: Secondary | ICD-10-CM

## 2023-02-23 DIAGNOSIS — E7849 Other hyperlipidemia: Secondary | ICD-10-CM

## 2023-02-23 MED ORDER — HYDROCHLOROTHIAZIDE 12.5 MG PO CAPS: 12.5000 mg | ORAL_CAPSULE | Freq: Every day | ORAL | 1 refills | Status: DC

## 2023-02-23 NOTE — Progress Notes (Signed)
 Established patient visit  Patient: Bonnie Shepard   DOB: [birth year]   38 y.o. Female  MRN: [id number] Visit Date: 02/23/2023  Today's healthcare provider: Debera Lat, PA-C   Chief Complaint  Patient presents with   Medical Management of Chronic Issues   Subjective       Discussed the use of AI scribe software for clinical note transcription with the patient, who gave verbal consent to proceed.  History of Present Illness   The patient, with a history of prediabetes, gastroesophageal reflux, and hypertension, presents for a routine follow-up. She reports maintaining her weight and has recently been contacted by a weight loss program. The patient admits to not drinking enough water and losing her hypertension medication for a week while traveling. She has since resumed the medication. She also reports discomfort after eating buffalo wings, a known trigger for her gastroesophageal reflux. The patient mentions a recent incident where she was hit in the chest with a softball, resulting in a breast hematoma. She has had imaging done and is due for a follow-up in nine months. The patient denies any changes in bowel movements or vomiting.           02/23/2023    8:24 AM 09/16/2022    9:32 AM 06/18/2020   11:19 AM  Depression screen PHQ 2/9  Decreased Interest 1 0 0  Down, Depressed, Hopeless 0 1 0  PHQ - 2 Score 1 1 0  Altered sleeping 2 3   Tired, decreased energy 1 3   Change in appetite 1 2   Feeling bad or failure about yourself  0 1   Trouble concentrating 1 2   Moving slowly or fidgety/restless 0 0   Suicidal thoughts 0 0   PHQ-9 Score 6 12   Difficult doing work/chores Not difficult at all Somewhat difficult       02/23/2023    8:24 AM 09/16/2022    9:32 AM 12/03/2015    1:43 PM 10/16/2015    8:42 AM  GAD 7 : Generalized Anxiety Score  Nervous, Anxious, on Edge 1 1 1 2   Control/stop worrying 2 0 1 2  Worry too much - different things 3 0 1 2  Trouble relaxing 1 0 1 2   Restless 0 0 1 2  Easily annoyed or irritable 2 2 1 2   Afraid - awful might happen 2 1 1 1   Total GAD 7 Score 11 4 7 13   Anxiety Difficulty Not difficult at all Not difficult at all      Medications: Outpatient Medications Prior to Visit  Medication Sig   albuterol (VENTOLIN HFA) 108 (90 Base) MCG/ACT inhaler Inhale 2 puffs into the lungs every 6 (six) hours as needed for wheezing or shortness of breath.   azelastine (ASTELIN) 0.1 % nasal spray Place into the nose.   meloxicam (MOBIC) 15 MG tablet Take 15 mg by mouth daily.   omeprazole (PRILOSEC OTC) 20 MG tablet Take 1 tablet (20 mg total) by mouth daily.   losartan (COZAAR) 25 MG tablet Take 1 tablet (25 mg total) by mouth daily.   No facility-administered medications prior to visit.    Review of Systems All negative Except see HPI       Objective    BP (!) 133/95 (BP Location: Left Arm, Patient Position: Sitting, Cuff Size: Large)   Pulse (!) 58   Resp 16   Wt 240 lb (108.9 kg)   LMP 02/05/2023   SpO2 99%  BMI 43.90 kg/m     Physical Exam Vitals reviewed.  Constitutional:      General: She is not in acute distress.    Appearance: Normal appearance. She is well-developed. She is obese. She is not diaphoretic.  HENT:     Head: Normocephalic and atraumatic.  Eyes:     General: No scleral icterus.    Conjunctiva/sclera: Conjunctivae normal.  Neck:     Thyroid: No thyromegaly.  Cardiovascular:     Rate and Rhythm: Normal rate and regular rhythm.     Pulses: Normal pulses.     Heart sounds: Normal heart sounds. No murmur heard. Pulmonary:     Effort: Pulmonary effort is normal. No respiratory distress.     Breath sounds: Normal breath sounds. No wheezing, rhonchi or rales.  Musculoskeletal:     Cervical back: Neck supple.     Right lower leg: No edema.     Left lower leg: No edema.  Lymphadenopathy:     Cervical: No cervical adenopathy.  Skin:    General: Skin is warm and dry.     Findings: No rash.   Neurological:     Mental Status: She is alert and oriented to person, place, and time. Mental status is at baseline.  Psychiatric:        Mood and Affect: Mood normal.        Behavior: Behavior normal.      No results found for any visits on 02/23/23.      Assessment and Plan    Gastroesophageal Reflux Disease (GERD) Reports good control with Omeprazole. Recent episode of reflux after consuming trigger food (buffalo wings). Discussed the importance of avoiding trigger foods and lifestyle modifications. -Continue Omeprazole. -Avoid trigger foods and maintain lifestyle modifications.  Hypertension Reports good control with medication, but had a period of non-compliance due to lost medication. Blood pressure was 140/83 at last check. Discussed the importance of consistent medication use and lifestyle modifications including increased water intake and low sodium diet. -Resume regular blood pressure medication. add HCTZ12.5 for a better BP control considering her previous BP while on losartan was in 140s -Check blood pressure regularly and record readings. -Increase water intake to a minimum of 8 glasses per day. -Maintain a low sodium diet.  Prediabetes Discussed the importance of diet in managing prediabetes. Patient is participating in a weight loss program and has lost 5 pounds since last visit. -Continue with weight loss program and dietary modifications. -Consider following the American Diabetic Association diet.  Hyperlipidemia Chronic Last ldl was 126 from 11/2022 Not on medication, advised low cholesterol diet and daily exercise  Substitutions to help cholesterol:   Substituting soy-based products (such as tofu or tempeh) for any meat in recipes   Substituting a lean meat such as (non-fried) poultry or fish (other than shrimp) for red meat in recipes   Replacing refined grain products with higher-fiber whole-grain products whenever possible   Drinking tea, carbonated  water, or plain water instead of sugar-sweetened soft drinks and fruit juices   Using a nut butter spread instead of traditional dairy butter .  Obesity (HCC) Chronic Body mass index is 43.9 kg/m. Workup advised Weight loss of 5% of pt's current weight via healthy diet and daily exercise encouraged.  Breast Hematoma Patient reports a breast hematoma following trauma. Imaging has been done and follow-up imaging is scheduled in 9 months. -Continue monitoring and follow-up imaging as scheduled.  General Health Maintenance -Continue regular eye check-ups. -Complete lab work today. -  Follow-up appointment in one month.     Orders Placed This Encounter  Procedures   TSH   Hemoglobin A1c   Lipid panel    Has the patient fasted?:   Yes   CBC with Differential/Platelet   Comprehensive metabolic panel    Has the patient fasted?:   Yes    Return in about 4 weeks (around 03/23/2023) for BP f/u.   The patient was advised to call back or seek an in-person evaluation if the symptoms worsen or if the condition fails to improve as anticipated.  I discussed the assessment and treatment plan with the patient. The patient was provided an opportunity to ask questions and all were answered. The patient agreed with the plan and demonstrated an understanding of the instructions.  I, Debera Lat, PA-C have reviewed all documentation for this visit. The documentation on 02/23/2023  for the exam, diagnosis, procedures, and orders are all accurate and complete.  Debera Lat, [facility], MMS [facility] [phone] (phone) [phone] (fax)  [facility]

## 2023-02-24 ENCOUNTER — Encounter: Payer: Self-pay | Admitting: Physician Assistant

## 2023-02-24 LAB — LIPID PANEL
Chol/HDL Ratio: 5.1 {ratio} — ABNORMAL HIGH (ref 0.0–4.4)
Cholesterol, Total: 189 mg/dL (ref 100–199)
HDL: 37 mg/dL — ABNORMAL LOW (ref >39)
LDL Chol Calc (NIH): 132 mg/dL — ABNORMAL HIGH (ref 0–99)
Triglycerides: 112 mg/dL (ref 0–149)
VLDL Cholesterol Cal: 20 mg/dL (ref 5–40)

## 2023-02-24 LAB — CBC WITH DIFFERENTIAL/PLATELET
Basophils Absolute: 0 10*3/uL (ref 0.0–0.2)
Basos: 0 %
EOS (ABSOLUTE): 0.2 10*3/uL (ref 0.0–0.4)
Eos: 2 %
Hematocrit: 44.4 % (ref 34.0–46.6)
Hemoglobin: 14.7 g/dL (ref 11.1–15.9)
Immature Grans (Abs): 0 10*3/uL (ref 0.0–0.1)
Immature Granulocytes: 0 %
Lymphocytes Absolute: 4 10*3/uL — ABNORMAL HIGH (ref 0.7–3.1)
Lymphs: 42 %
MCH: 30.8 pg (ref 26.6–33.0)
MCHC: 33.1 g/dL (ref 31.5–35.7)
MCV: 93 fL (ref 79–97)
Monocytes Absolute: 0.5 10*3/uL (ref 0.1–0.9)
Monocytes: 5 %
Neutrophils Absolute: 4.8 10*3/uL (ref 1.4–7.0)
Neutrophils: 51 %
Platelets: 256 10*3/uL (ref 150–450)
RBC: 4.78 x10E6/uL (ref 3.77–5.28)
RDW: 12.2 % (ref 11.7–15.4)
WBC: 9.6 10*3/uL (ref 3.4–10.8)

## 2023-02-24 LAB — COMPREHENSIVE METABOLIC PANEL
ALT: 55 [IU]/L — ABNORMAL HIGH (ref 0–32)
AST: 31 [IU]/L (ref 0–40)
Albumin: 4.4 g/dL (ref 3.9–4.9)
Alkaline Phosphatase: 99 [IU]/L (ref 44–121)
BUN/Creatinine Ratio: 21 (ref 9–23)
BUN: 15 mg/dL (ref 6–20)
Bilirubin Total: 0.3 mg/dL (ref 0.0–1.2)
CO2: 24 mmol/L (ref 20–29)
Calcium: 9.8 mg/dL (ref 8.7–10.2)
Chloride: 102 mmol/L (ref 96–106)
Creatinine, Ser: 0.71 mg/dL (ref 0.57–1.00)
Globulin, Total: 2.5 g/dL (ref 1.5–4.5)
Glucose: 108 mg/dL — ABNORMAL HIGH (ref 70–99)
Potassium: 4.5 mmol/L (ref 3.5–5.2)
Sodium: 141 mmol/L (ref 134–144)
Total Protein: 6.9 g/dL (ref 6.0–8.5)
eGFR: 112 mL/min/{1.73_m2} (ref >59)

## 2023-02-24 LAB — HEMOGLOBIN A1C
Est. average glucose Bld gHb Est-mCnc: 120 mg/dL
Hgb A1c MFr Bld: 5.8 % — ABNORMAL HIGH (ref 4.8–5.6)

## 2023-02-24 LAB — TSH: TSH: 2.12 u[IU]/mL (ref 0.450–4.500)

## 2023-03-09 ENCOUNTER — Encounter (INDEPENDENT_AMBULATORY_CARE_PROVIDER_SITE_OTHER): Payer: Self-pay

## 2023-03-21 NOTE — Progress Notes (Deleted)
 Established patient visit  Patient: Bonnie Shepard   DOB: 1985-06-09   37 y.o. Female  MRN: [id number] Visit Date: 03/24/2023  Today's healthcare provider: Debera Lat, PA-C   No chief complaint on file.  Subjective       Discussed the use of AI scribe software for clinical note transcription with the patient, who gave verbal consent to proceed.  History of Present Illness               02/23/2023    8:24 AM 09/16/2022    9:32 AM 06/18/2020   11:19 AM  Depression screen PHQ 2/9  Decreased Interest 1 0 0  Down, Depressed, Hopeless 0 1 0  PHQ - 2 Score 1 1 0  Altered sleeping 2 3   Tired, decreased energy 1 3   Change in appetite 1 2   Feeling bad or failure about yourself  0 1   Trouble concentrating 1 2   Moving slowly or fidgety/restless 0 0   Suicidal thoughts 0 0   PHQ-9 Score 6 12   Difficult doing work/chores Not difficult at all Somewhat difficult       02/23/2023    8:24 AM 09/16/2022    9:32 AM 12/03/2015    1:43 PM 10/16/2015    8:42 AM  GAD 7 : Generalized Anxiety Score  Nervous, Anxious, on Edge 1 1 1 2   Control/stop worrying 2 0 1 2  Worry too much - different things 3 0 1 2  Trouble relaxing 1 0 1 2  Restless 0 0 1 2  Easily annoyed or irritable 2 2 1 2   Afraid - awful might happen 2 1 1 1   Total GAD 7 Score 11 4 7 13   Anxiety Difficulty Not difficult at all Not difficult at all      Medications: Outpatient Medications Prior to Visit  Medication Sig   albuterol (VENTOLIN HFA) 108 (90 Base) MCG/ACT inhaler Inhale 2 puffs into the lungs every 6 (six) hours as needed for wheezing or shortness of breath.   azelastine (ASTELIN) 0.1 % nasal spray Place into the nose.   hydrochlorothiazide (MICROZIDE) 12.5 MG capsule Take 1 capsule (12.5 mg total) by mouth daily.   losartan (COZAAR) 25 MG tablet Take 1 tablet (25 mg total) by mouth daily.   meloxicam (MOBIC) 15 MG tablet Take 15 mg by mouth daily.   omeprazole (PRILOSEC OTC) 20 MG tablet Take 1 tablet  (20 mg total) by mouth daily.   No facility-administered medications prior to visit.    Review of Systems All negative Except see HPI   {Insert previous labs (optional):23779} {See past labs  Heme  Chem  Endocrine  Serology  Results Review (optional):1}   Objective    LMP 02/05/2023  {Insert last BP/Wt (optional):23777}{See vitals history (optional):1}   Physical Exam   No results found for any visits on 03/24/23.      Assessment and Plan             No orders of the defined types were placed in this encounter.   No follow-ups on file.   The patient was advised to call back or seek an in-person evaluation if the symptoms worsen or if the condition fails to improve as anticipated.  I discussed the assessment and treatment plan with the patient. The patient was provided an opportunity to ask questions and all were answered. The patient agreed with the plan and demonstrated an understanding of the instructions.  Oretha Caprice  Nelda Luckey, PA-C have reviewed all documentation for this visit. The documentation on 03/24/2023  for the exam, diagnosis, procedures, and orders are all accurate and complete.  Debera Lat, [facility], MMS [facility] [phone] (phone) [phone] (fax)  [facility]

## 2023-03-24 ENCOUNTER — Ambulatory Visit: Payer: BC Managed Care – PPO | Admitting: Physician Assistant

## 2023-03-24 DIAGNOSIS — K219 Gastro-esophageal reflux disease without esophagitis: Secondary | ICD-10-CM

## 2023-03-24 DIAGNOSIS — R7303 Prediabetes: Secondary | ICD-10-CM

## 2023-03-24 DIAGNOSIS — T148XXA Other injury of unspecified body region, initial encounter: Secondary | ICD-10-CM

## 2023-03-24 DIAGNOSIS — E7849 Other hyperlipidemia: Secondary | ICD-10-CM

## 2023-03-24 DIAGNOSIS — F17200 Nicotine dependence, unspecified, uncomplicated: Secondary | ICD-10-CM

## 2023-03-24 DIAGNOSIS — I10 Essential (primary) hypertension: Secondary | ICD-10-CM

## 2023-04-08 ENCOUNTER — Emergency Department: Admission: EM | Admit: 2023-04-08 | Discharge: 2023-04-08 | Disposition: A | Discharge status: Home / Self Care

## 2023-04-08 ENCOUNTER — Encounter: Payer: Self-pay | Admitting: Emergency Medicine

## 2023-04-08 ENCOUNTER — Other Ambulatory Visit: Payer: Self-pay

## 2023-04-08 DIAGNOSIS — R197 Diarrhea, unspecified: Secondary | ICD-10-CM | POA: Diagnosis not present

## 2023-04-08 DIAGNOSIS — I10 Essential (primary) hypertension: Secondary | ICD-10-CM | POA: Diagnosis not present

## 2023-04-08 DIAGNOSIS — R110 Nausea: Secondary | ICD-10-CM | POA: Diagnosis not present

## 2023-04-08 DIAGNOSIS — R112 Nausea with vomiting, unspecified: Secondary | ICD-10-CM | POA: Diagnosis not present

## 2023-04-08 DIAGNOSIS — D72829 Elevated white blood cell count, unspecified: Secondary | ICD-10-CM | POA: Diagnosis not present

## 2023-04-08 LAB — COMPREHENSIVE METABOLIC PANEL WITH GFR
ALT: 63 U/L — ABNORMAL HIGH (ref 0–44)
AST: 42 U/L — ABNORMAL HIGH (ref 15–41)
Albumin: 3.9 g/dL (ref 3.5–5.0)
Alkaline Phosphatase: 79 U/L (ref 38–126)
Anion gap: 10 (ref 5–15)
BUN: 17 mg/dL (ref 6–20)
CO2: 23 mmol/L (ref 22–32)
Calcium: 9.2 mg/dL (ref 8.9–10.3)
Chloride: 104 mmol/L (ref 98–111)
Creatinine, Ser: 0.76 mg/dL (ref 0.44–1.00)
GFR, Estimated: >60 mL/min (ref >60)
Glucose, Bld: 176 mg/dL — ABNORMAL HIGH (ref 70–99)
Potassium: 3.5 mmol/L (ref 3.5–5.1)
Sodium: 137 mmol/L (ref 135–145)
Total Bilirubin: 0.6 mg/dL (ref 0.0–1.2)
Total Protein: 7.5 g/dL (ref 6.5–8.1)

## 2023-04-08 LAB — URINALYSIS, ROUTINE W REFLEX MICROSCOPIC
Bilirubin Urine: NEGATIVE
Glucose, UA: NEGATIVE mg/dL
Ketones, ur: 20 mg/dL — AB
Leukocytes,Ua: NEGATIVE
Nitrite: NEGATIVE
Protein, ur: NEGATIVE mg/dL
RBC / HPF: >50 RBC/hpf (ref 0–5)
Specific Gravity, Urine: 1.019 (ref 1.005–1.030)
pH: 8 (ref 5.0–8.0)

## 2023-04-08 LAB — CBC
HCT: 41.1 % (ref 36.0–46.0)
Hemoglobin: 14.3 g/dL (ref 12.0–15.0)
MCH: 30.9 pg (ref 26.0–34.0)
MCHC: 34.8 g/dL (ref 30.0–36.0)
MCV: 88.8 fL (ref 80.0–100.0)
Platelets: 306 10*3/uL (ref 150–400)
RBC: 4.63 MIL/uL (ref 3.87–5.11)
RDW: 12.3 % (ref 11.5–15.5)
WBC: 14.3 10*3/uL — ABNORMAL HIGH (ref 4.0–10.5)
nRBC: 0 % (ref 0.0–0.2)

## 2023-04-08 LAB — POC URINE PREG, ED: Preg Test, Ur: NEGATIVE

## 2023-04-08 LAB — RESP PANEL BY RT-PCR (RSV, FLU A&B, COVID)  RVPGX2
Influenza A by PCR: NEGATIVE
Influenza B by PCR: NEGATIVE
Resp Syncytial Virus by PCR: NEGATIVE
SARS Coronavirus 2 by RT PCR: NEGATIVE

## 2023-04-08 LAB — LIPASE, BLOOD: Lipase: 28 U/L (ref 11–51)

## 2023-04-08 MED ORDER — ONDANSETRON HCL 4 MG/2ML IJ SOLN
4.0000 mg | Freq: Once | INTRAMUSCULAR | Status: AC
  Administered 2023-04-08: 4 mg via INTRAVENOUS
  Filled 2023-04-08: qty 2

## 2023-04-08 MED ORDER — SODIUM CHLORIDE 0.9 % IV BOLUS
1000.0000 mL | Freq: Once | INTRAVENOUS | Status: AC
  Administered 2023-04-08: 1000 mL via INTRAVENOUS

## 2023-04-08 MED ORDER — ONDANSETRON 4 MG PO TBDP
4.0000 mg | ORAL_TABLET | Freq: Three times a day (TID) | ORAL | 0 refills | Status: DC | PRN
Start: 2023-04-08 — End: 2023-11-29

## 2023-04-08 NOTE — ED Triage Notes (Addendum)
 First Nurse Note:  Pt via [facility] from home. Pt c/o NV since she left work today. Report pt has been diaphoretic and slow to respond since EMS arrived. Denies any pain. Pt is A&Ox4 and NAD 97.4 oral  62 HR  4mg  of Zofran and approx of LR, 20G L AC

## 2023-04-08 NOTE — ED Triage Notes (Signed)
 See first nurse note. Patient c/o N/V/D and chills since this AM. Lethargic and very slow to respond in triage.

## 2023-04-08 NOTE — Discharge Instructions (Signed)
 Your evaluation in the emergency department was overall reassuring.  I suspect you likely have a viral illness that should resolve on its own within the next 1-2 days.  I prescribed you a nausea medication to use as needed.  Continue to drink plenty of water and please do follow-up with your primary care doctor.  Return to the emergency department with any new or worsening symptoms.

## 2023-04-08 NOTE — ED Provider Notes (Signed)
 [facility] Provider Note    Event Date/Time   First MD Initiated Contact with Patient 04/08/23 1147     (approximate)   History   Nausea First Nurse Note:  Pt via [facility] from home. Pt c/o NV since she left work today. Report pt has been diaphoretic and slow to respond since EMS arrived. Denies any pain. Pt is A&Ox4 and NAD 97.4 oral  62 HR  4mg  of Zofran and approx of LR, 20G L AC  See first nurse note. Patient c/o N/V/D and chills since this AM. Lethargic and very slow to respond in triage.    HPI  Bonnie Shepard is a 38 y.o. female MH hypertension, anxiety, GERD, diverticulitis presents for evaluation of nausea, vomiting, diarrhea -Multiple episodes of vomiting today, 5-10, nonbloody, nonbilious.  Also multiple episodes of diarrhea.  No black or bloody stools. -No fevers -No abdominal pain -No clear urinary symptoms -Ate a burger at a restaurant last night, wonders if this could be contributing to symptoms     Physical Exam   Triage Vital Signs: ED Triage Vitals  Encounter Vitals Group     BP 04/08/23 1039 (!) 165/107     Systolic BP Percentile --      Diastolic BP Percentile --      Pulse Rate 04/08/23 1039 (!) 58     Resp --      Temp --      Temp src --      SpO2 04/08/23 1039 98 %     Weight 04/08/23 1146 238 lb 1.6 oz (108 kg)     Height 04/08/23 1146 5\' 2"  (1.575 m)     Head Circumference --      Peak Flow --      Pain Score 04/08/23 1102 7     Pain Loc --      Pain Education --      Exclude from Growth Chart --     Most recent vital signs: Vitals:   04/08/23 1039 04/08/23 1229  BP: (!) 165/107   Pulse: (!) 58   Temp:  97.8 F (36.6 C)  SpO2: 98%    Temp 98.0 F oral on my eval  General: Awake, appears uncomfortable but nontoxic CV:  Good peripheral perfusion.  Regular rate and rhythm (heart rate 60s at time of my eval), RP 2+. Resp:  Normal effort.  CTAB. Abd:  No distention.  No significant tenderness to  palpation throughout.  No CVA tenderness.   ED Results / Procedures / Treatments   Labs (all labs ordered are listed, but only abnormal results are displayed) Labs Reviewed  COMPREHENSIVE METABOLIC PANEL WITH GFR - Abnormal; Notable for the following components:      Result Value   Glucose, Bld 176 (*)    AST 42 (*)    ALT 63 (*)    All other components within normal limits  CBC - Abnormal; Notable for the following components:   WBC 14.3 (*)    All other components within normal limits  URINALYSIS, ROUTINE W REFLEX MICROSCOPIC - Abnormal; Notable for the following components:   Color, Urine YELLOW (*)    APPearance CLEAR (*)    Hgb urine dipstick LARGE (*)    Ketones, ur 20 (*)    Bacteria, UA RARE (*)    All other components within normal limits  RESP PANEL BY RT-PCR (RSV, FLU A&B, COVID)  RVPGX2  LIPASE, BLOOD  POC URINE PREG, ED  EKG  N/a   RADIOLOGY  N/a   PROCEDURES:  Critical Care performed: No  Procedures   MEDICATIONS ORDERED IN ED: Medications  sodium chloride 0.9 % bolus 1,000 mL (1,000 mLs Intravenous New Bag/Given 04/08/23 1222)  ondansetron (ZOFRAN) injection 4 mg (4 mg Intravenous Given 04/08/23 1222)     IMPRESSION / MDM / ASSESSMENT AND PLAN / ED COURSE  I reviewed the triage vital signs and the nursing notes.                              Differential diagnosis includes, but is not limited to, food poisoning, gastroenteritis/viral syndrome, do not suspect acute intra-abdominal pathology including cholecystitis, pancreatitis, appendicitis, diverticulitis at this time.  Consider underlying UTI.  Patient screening labs collected at triage, will add viral panel and plan for IV fluid and Zofran with serial abdominal exams.  Initial labs do show mild transaminitis though these are largely unchanged from patient's prior, did have an ultrasound of right upper quadrant in 2022 showing no cholelithiasis and some element of mild steatosis.  Patient does  have a leukocytosis though initial exam is incredibly reassuring, suspect likely due to viral GI illness.  No negation for advanced imaging at this time.  Patient's presentation is most consistent with acute complicated illness / injury requiring diagnostic workup.  ED course below.  Workup overall reassuring.  Symptoms controlled in the emergency department with IV fluid and Zofran.  Tolerating p.o. feeling much better.  Overall suspect likely viral syndrome.  Worsening.  Plan for PMD follow-up.  ED return precautions in place.  Rx Zofran.  Patient agrees with plan.  Clinical Course as of 04/08/23 1347  Thu Apr 08, 2023  1246 Reevaluated, feeling better, no further vomiting.  Ambulating to bathroom to provide urine sample. [MM]  1321 Urinalysis reviewed, some hematuria the patient states she is on her menstrual cycle.  No evidence of urinary tract infection. [MM]  1345 Patient reevaluated, nausea has resolved, no further diarrhea.  Feels much better and amenable to discharge home.  Repeat abdominal exam with no tenderness to deep palpation throughout abdomen.  No clinical concern for acute underlying pathology at this time, suspect likely viral syndrome/food poisoning.  Will prescribe Zofran and plan for PMD follow-up.  ED return precautions in place.  Patient agrees with plan. [MM]    Clinical Course User Index [MM] Marinell Blight, MD     FINAL CLINICAL IMPRESSION(S) / ED DIAGNOSES   Final diagnoses:  Nausea without vomiting  Diarrhea, unspecified type     Rx / DC Orders   ED Discharge Orders          Ordered    ondansetron (ZOFRAN-ODT) 4 MG disintegrating tablet  Every 8 hours PRN        04/08/23 1346             Note:  This document was prepared using Dragon voice recognition software and may include unintentional dictation errors.   Marinell Blight, MD 04/08/23 [phone]

## 2023-04-08 NOTE — ED Notes (Signed)
 See triage note  Presents with some n/v/d since about 5 am  Positive chills Unsure of fever  but is currently afebrile on arrival

## 2023-04-21 ENCOUNTER — Ambulatory Visit: Admitting: Physician Assistant

## 2023-05-26 ENCOUNTER — Other Ambulatory Visit: Payer: Self-pay | Admitting: Physician Assistant

## 2023-05-26 DIAGNOSIS — I10 Essential (primary) hypertension: Secondary | ICD-10-CM

## 2023-07-31 ENCOUNTER — Ambulatory Visit (INDEPENDENT_AMBULATORY_CARE_PROVIDER_SITE_OTHER)

## 2023-07-31 ENCOUNTER — Ambulatory Visit
Admission: EM | Admit: 2023-07-31 | Discharge: 2023-07-31 | Disposition: A | Discharge status: Home / Self Care | Attending: Physician Assistant | Admitting: Physician Assistant

## 2023-07-31 DIAGNOSIS — S161XXA Strain of muscle, fascia and tendon at neck level, initial encounter: Secondary | ICD-10-CM

## 2023-07-31 DIAGNOSIS — M542 Cervicalgia: Secondary | ICD-10-CM | POA: Diagnosis not present

## 2023-07-31 MED ORDER — NAPROXEN 375 MG PO TABS: 375.0000 mg | ORAL_TABLET | Freq: Two times a day (BID) | ORAL | 0 refills | Status: DC

## 2023-07-31 MED ORDER — METHOCARBAMOL 500 MG PO TABS: 500.0000 mg | ORAL_TABLET | Freq: Two times a day (BID) | ORAL | 0 refills | Status: DC

## 2023-07-31 MED ORDER — LIDOCAINE 5 % EX PTCH: 1.0000 | MEDICATED_PATCH | CUTANEOUS | 0 refills | Status: AC
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
## 2023-07-31 NOTE — Discharge Instructions (Signed)
 I believe that you have injured the muscles in your neck.  Please start Naprosyn  twice a day.  Do not take other NSAIDs with this medication including aspirin, ibuprofen /Advil , naproxen /Aleve .  You can use acetaminophen /Tylenol  for breakthrough pain.  Take Robaxin  up to 2 times a day.  This will make you sleepy so do not drive or drink alcohol taking it.  Apply lidocaine  patch for 12 hours during the day; remove this for 12 hours at night.  Use only 1 patch per 24 hours.  I recommend you follow-up with orthopedics; call to schedule an appointment.  If anything worsens and you have recurrent numbness/tingling in your arm, arm weakness, worsening pain, fever, nausea, vomiting, severe headache you need to go to the emergency room immediately as we discussed.
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
## 2023-08-09 ENCOUNTER — Emergency Department
Admission: EM | Admit: 2023-08-09 | Discharge: 2023-08-09 | Disposition: A | Discharge status: Home / Self Care | Attending: Emergency Medicine | Admitting: Emergency Medicine

## 2023-08-09 ENCOUNTER — Emergency Department

## 2023-08-09 ENCOUNTER — Other Ambulatory Visit: Payer: Self-pay

## 2023-08-09 ENCOUNTER — Encounter: Payer: Self-pay | Admitting: Emergency Medicine

## 2023-08-09 DIAGNOSIS — S0990XA Unspecified injury of head, initial encounter: Secondary | ICD-10-CM | POA: Diagnosis not present

## 2023-08-09 DIAGNOSIS — M542 Cervicalgia: Secondary | ICD-10-CM | POA: Insufficient documentation

## 2023-08-09 DIAGNOSIS — R531 Weakness: Secondary | ICD-10-CM | POA: Insufficient documentation

## 2023-08-09 DIAGNOSIS — R200 Anesthesia of skin: Secondary | ICD-10-CM | POA: Insufficient documentation

## 2023-08-09 DIAGNOSIS — I10 Essential (primary) hypertension: Secondary | ICD-10-CM | POA: Insufficient documentation

## 2023-08-09 DIAGNOSIS — R202 Paresthesia of skin: Secondary | ICD-10-CM | POA: Diagnosis not present

## 2023-08-09 DIAGNOSIS — M546 Pain in thoracic spine: Secondary | ICD-10-CM | POA: Insufficient documentation

## 2023-08-09 DIAGNOSIS — M25511 Pain in right shoulder: Secondary | ICD-10-CM | POA: Insufficient documentation

## 2023-08-09 DIAGNOSIS — S199XXA Unspecified injury of neck, initial encounter: Secondary | ICD-10-CM | POA: Diagnosis not present

## 2023-08-09 DIAGNOSIS — R29818 Other symptoms and signs involving the nervous system: Secondary | ICD-10-CM | POA: Diagnosis not present

## 2023-08-09 LAB — BASIC METABOLIC PANEL WITH GFR
Anion gap: 12 (ref 5–15)
BUN: 17 mg/dL (ref 6–20)
CO2: 22 mmol/L (ref 22–32)
Calcium: 8.8 mg/dL — ABNORMAL LOW (ref 8.9–10.3)
Chloride: 106 mmol/L (ref 98–111)
Creatinine, Ser: 0.73 mg/dL (ref 0.44–1.00)
GFR, Estimated: >60 mL/min (ref >60)
Glucose, Bld: 118 mg/dL — ABNORMAL HIGH (ref 70–99)
Potassium: 3.9 mmol/L (ref 3.5–5.1)
Sodium: 140 mmol/L (ref 135–145)

## 2023-08-09 LAB — CBC WITH DIFFERENTIAL/PLATELET
Abs Immature Granulocytes: 0.03 K/uL (ref 0.00–0.07)
Basophils Absolute: 0 K/uL (ref 0.0–0.1)
Basophils Relative: 0 %
Eosinophils Absolute: 0.2 K/uL (ref 0.0–0.5)
Eosinophils Relative: 2 %
HCT: 38.7 % (ref 36.0–46.0)
Hemoglobin: 13.4 g/dL (ref 12.0–15.0)
Immature Granulocytes: 0 %
Lymphocytes Relative: 37 %
Lymphs Abs: 2.7 K/uL (ref 0.7–4.0)
MCH: 31.8 pg (ref 26.0–34.0)
MCHC: 34.6 g/dL (ref 30.0–36.0)
MCV: 91.9 fL (ref 80.0–100.0)
Monocytes Absolute: 0.4 K/uL (ref 0.1–1.0)
Monocytes Relative: 5 %
Neutro Abs: 4 K/uL (ref 1.7–7.7)
Neutrophils Relative %: 56 %
Platelets: 221 K/uL (ref 150–400)
RBC: 4.21 MIL/uL (ref 3.87–5.11)
RDW: 12.5 % (ref 11.5–15.5)
WBC: 7.3 K/uL (ref 4.0–10.5)
nRBC: 0 % (ref 0.0–0.2)

## 2023-08-09 MED ORDER — FAMOTIDINE IN NACL 20-0.9 MG/50ML-% IV SOLN
20.0000 mg | Freq: Once | INTRAVENOUS | Status: AC
  Administered 2023-08-09: 20 mg via INTRAVENOUS
  Filled 2023-08-09: qty 50

## 2023-08-09 MED ORDER — IOHEXOL 350 MG/ML SOLN
75.0000 mL | Freq: Once | INTRAVENOUS | Status: AC | PRN
  Administered 2023-08-09: 75 mL via INTRAVENOUS

## 2023-08-09 MED ORDER — METHYLPREDNISOLONE SODIUM SUCC 125 MG IJ SOLR
125.0000 mg | Freq: Once | INTRAMUSCULAR | Status: AC
  Administered 2023-08-09: 125 mg via INTRAVENOUS
  Filled 2023-08-09: qty 2

## 2023-08-09 MED ORDER — HYDROCHLOROTHIAZIDE 12.5 MG PO CAPS: 12.5000 mg | ORAL_CAPSULE | Freq: Every day | ORAL | 1 refills | Status: AC

## 2023-08-09 MED ORDER — DIPHENHYDRAMINE HCL 50 MG/ML IJ SOLN: 12.5000 mg | Freq: Once | INTRAMUSCULAR | Status: DC

## 2023-08-09 MED ORDER — DIPHENHYDRAMINE HCL 50 MG/ML IJ SOLN
50.0000 mg | Freq: Once | INTRAMUSCULAR | Status: AC
  Administered 2023-08-09: 50 mg via INTRAVENOUS
  Filled 2023-08-09: qty 1

## 2023-08-09 MED ORDER — GADOBUTROL 1 MMOL/ML IV SOLN
10.0000 mL | Freq: Once | INTRAVENOUS | Status: AC | PRN
  Administered 2023-08-09: 10 mL via INTRAVENOUS

## 2023-08-09 NOTE — ED Notes (Signed)
 Pt back from scans. Pt reports new insect bites since went to CT. Provider informed.
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
## 2023-08-09 NOTE — Discharge Instructions (Signed)
 This CT, CT angiogram and MRI of your head and neck were all normal today.  Believe the numbness and tingling in your right hand is due to a nerve compression, likely from a muscle spasm.  Please continue to take the Tylenol , ibuprofen  and use topical pain relievers like lidocaine  patches as well as ice and heat.  Recommend that you follow-up with orthopedics.  Their information is attached.  Please call to schedule.  I sent a refill of the hydrochlorothiazide .  Believe you had an allergic reaction to the contrast dye from the studies.  This has been added to your allergy list.  You may have recurrence of some itching.  Please take Benadryl  and nondrowsy allergy medication like Claritin, Allegra  or Zyrtec  to treat this.  If you have any difficulty breathing, swelling of your tongue/lips/throat please return to the emergency department.

## 2023-08-12 ENCOUNTER — Emergency Department
Admission: EM | Admit: 2023-08-12 | Discharge: 2023-08-12 | Disposition: A | Discharge status: Home / Self Care | Source: Other Acute Inpatient Hospital

## 2023-08-12 ENCOUNTER — Other Ambulatory Visit: Payer: Self-pay

## 2023-08-12 ENCOUNTER — Emergency Department

## 2023-08-12 DIAGNOSIS — J45909 Unspecified asthma, uncomplicated: Secondary | ICD-10-CM | POA: Diagnosis not present

## 2023-08-12 DIAGNOSIS — R112 Nausea with vomiting, unspecified: Secondary | ICD-10-CM | POA: Diagnosis not present

## 2023-08-12 DIAGNOSIS — R1110 Vomiting, unspecified: Secondary | ICD-10-CM | POA: Diagnosis not present

## 2023-08-12 DIAGNOSIS — R001 Bradycardia, unspecified: Secondary | ICD-10-CM | POA: Diagnosis not present

## 2023-08-12 DIAGNOSIS — I10 Essential (primary) hypertension: Secondary | ICD-10-CM | POA: Insufficient documentation

## 2023-08-12 DIAGNOSIS — R918 Other nonspecific abnormal finding of lung field: Secondary | ICD-10-CM | POA: Diagnosis not present

## 2023-08-12 DIAGNOSIS — R059 Cough, unspecified: Secondary | ICD-10-CM | POA: Diagnosis not present

## 2023-08-12 DIAGNOSIS — J069 Acute upper respiratory infection, unspecified: Secondary | ICD-10-CM | POA: Diagnosis not present

## 2023-08-12 LAB — URINALYSIS, ROUTINE W REFLEX MICROSCOPIC
Bacteria, UA: NONE SEEN
Bilirubin Urine: NEGATIVE
Glucose, UA: NEGATIVE mg/dL
Ketones, ur: NEGATIVE mg/dL
Nitrite: NEGATIVE
Protein, ur: NEGATIVE mg/dL
Specific Gravity, Urine: 1.016 (ref 1.005–1.030)
pH: 8 (ref 5.0–8.0)

## 2023-08-12 LAB — COMPREHENSIVE METABOLIC PANEL WITH GFR
ALT: 39 U/L (ref 0–44)
AST: 31 U/L (ref 15–41)
Albumin: 4.1 g/dL (ref 3.5–5.0)
Alkaline Phosphatase: 64 U/L (ref 38–126)
Anion gap: 16 — ABNORMAL HIGH (ref 5–15)
BUN: 22 mg/dL — ABNORMAL HIGH (ref 6–20)
CO2: 22 mmol/L (ref 22–32)
Calcium: 9.2 mg/dL (ref 8.9–10.3)
Chloride: 101 mmol/L (ref 98–111)
Creatinine, Ser: 0.83 mg/dL (ref 0.44–1.00)
GFR, Estimated: >60 mL/min (ref >60)
Glucose, Bld: 133 mg/dL — ABNORMAL HIGH (ref 70–99)
Potassium: 3.3 mmol/L — ABNORMAL LOW (ref 3.5–5.1)
Sodium: 139 mmol/L (ref 135–145)
Total Bilirubin: 0.8 mg/dL (ref 0.0–1.2)
Total Protein: 7.3 g/dL (ref 6.5–8.1)

## 2023-08-12 LAB — RESP PANEL BY RT-PCR (RSV, FLU A&B, COVID)  RVPGX2
Influenza A by PCR: NEGATIVE
Influenza B by PCR: NEGATIVE
Resp Syncytial Virus by PCR: NEGATIVE
SARS Coronavirus 2 by RT PCR: NEGATIVE

## 2023-08-12 LAB — CBC
HCT: 45.2 % (ref 36.0–46.0)
Hemoglobin: 15.4 g/dL — ABNORMAL HIGH (ref 12.0–15.0)
MCH: 30.4 pg (ref 26.0–34.0)
MCHC: 34.1 g/dL (ref 30.0–36.0)
MCV: 89.3 fL (ref 80.0–100.0)
Platelets: 323 K/uL (ref 150–400)
RBC: 5.06 MIL/uL (ref 3.87–5.11)
RDW: 12.6 % (ref 11.5–15.5)
WBC: 11.6 K/uL — ABNORMAL HIGH (ref 4.0–10.5)
nRBC: 0 % (ref 0.0–0.2)

## 2023-08-12 LAB — LIPASE, BLOOD: Lipase: 35 U/L (ref 11–51)

## 2023-08-12 LAB — BRAIN NATRIURETIC PEPTIDE: B Natriuretic Peptide: 43.6 pg/mL (ref 0.0–100.0)

## 2023-08-12 LAB — PREGNANCY, URINE: Preg Test, Ur: NEGATIVE

## 2023-08-12 MED ORDER — EPINEPHRINE 0.3 MG/0.3ML IJ SOAJ
0.3000 mg | Freq: Once | INTRAMUSCULAR | Status: AC
  Administered 2023-08-12: 0.3 mg via INTRAMUSCULAR
  Filled 2023-08-12: qty 0.3

## 2023-08-12 MED ORDER — DIPHENHYDRAMINE HCL 50 MG/ML IJ SOLN
25.0000 mg | Freq: Once | INTRAMUSCULAR | Status: AC
  Administered 2023-08-12: 25 mg via INTRAVENOUS
  Filled 2023-08-12: qty 1

## 2023-08-12 MED ORDER — POTASSIUM CHLORIDE 20 MEQ PO PACK
40.0000 meq | PACK | Freq: Once | ORAL | Status: AC
  Administered 2023-08-12: 40 meq via ORAL
  Filled 2023-08-12: qty 2

## 2023-08-12 MED ORDER — IPRATROPIUM-ALBUTEROL 0.5-2.5 (3) MG/3ML IN SOLN
3.0000 mL | Freq: Once | RESPIRATORY_TRACT | Status: AC
  Administered 2023-08-12: 3 mL via RESPIRATORY_TRACT
  Filled 2023-08-12: qty 3

## 2023-08-12 MED ORDER — METOCLOPRAMIDE HCL 5 MG/ML IJ SOLN
10.0000 mg | Freq: Once | INTRAMUSCULAR | Status: AC
  Administered 2023-08-12: 10 mg via INTRAVENOUS
  Filled 2023-08-12: qty 2

## 2023-08-12 MED ORDER — PROCHLORPERAZINE EDISYLATE 10 MG/2ML IJ SOLN
10.0000 mg | Freq: Once | INTRAMUSCULAR | Status: AC
  Administered 2023-08-12: 10 mg via INTRAVENOUS
  Filled 2023-08-12: qty 2

## 2023-08-12 MED ORDER — SODIUM CHLORIDE 0.9 % IV BOLUS
1000.0000 mL | Freq: Once | INTRAVENOUS | Status: AC
  Administered 2023-08-12: 1000 mL via INTRAVENOUS

## 2023-08-12 MED ORDER — PROCHLORPERAZINE MALEATE 10 MG PO TABS: 10.0000 mg | ORAL_TABLET | Freq: Four times a day (QID) | ORAL | 0 refills | Status: DC | PRN

## 2023-08-12 MED ORDER — EPINEPHRINE 0.3 MG/0.3ML IJ SOAJ: 0.3000 mg | INTRAMUSCULAR | 0 refills | Status: AC | PRN

## 2023-08-12 MED ORDER — HYDROCHLOROTHIAZIDE 12.5 MG PO TABS
12.5000 mg | ORAL_TABLET | Freq: Once | ORAL | Status: AC
  Administered 2023-08-12: 12.5 mg via ORAL
  Filled 2023-08-12: qty 1

## 2023-08-12 MED ORDER — ACETAMINOPHEN 500 MG PO TABS
1000.0000 mg | ORAL_TABLET | Freq: Once | ORAL | Status: AC
  Administered 2023-08-12: 1000 mg via ORAL
  Filled 2023-08-12: qty 2

## 2023-08-12 NOTE — ED Triage Notes (Signed)
 Pt via POV from home. Pt c/o NV since Tuesday. Denies any abd pain. Reports a headache also. Denies hx of migraine. States she does have a hx of HTN but unable to keep all her medications downs. Pt is A&Ox4 and NAD
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
## 2023-08-12 NOTE — Discharge Instructions (Addendum)
 The patient in the emergency department was overall reassuring.  As discussed, your blood pressure is elevated today, you should take all of your blood pressure medications as prescribed.  I do suspect you likely have a viral illness, and this should improve on its own within the next few days.  I prescribed you an additional nausea medication to use as needed.  I highly doubt that you had a severe allergic reaction causing your vomiting however you were treated for this while here and did seem to improve afterward though the timeline is somewhat unclear--I have prescribed you an EpiPen  to use out of abundance of caution if you ever believe you are having a severe allergic reaction.
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
## 2023-08-17 ENCOUNTER — Encounter: Payer: Self-pay | Admitting: Family Medicine

## 2023-08-17 ENCOUNTER — Ambulatory Visit: Admitting: Family Medicine

## 2023-08-17 VITALS — BP 122/77 | HR 64 | Ht 62.0 in | Wt 229.0 lb

## 2023-08-17 DIAGNOSIS — A084 Viral intestinal infection, unspecified: Secondary | ICD-10-CM | POA: Diagnosis not present

## 2023-08-17 DIAGNOSIS — J069 Acute upper respiratory infection, unspecified: Secondary | ICD-10-CM | POA: Diagnosis not present

## 2023-08-17 DIAGNOSIS — K149 Disease of tongue, unspecified: Secondary | ICD-10-CM

## 2023-08-17 DIAGNOSIS — E876 Hypokalemia: Secondary | ICD-10-CM | POA: Diagnosis not present

## 2023-08-17 NOTE — Progress Notes (Signed)
 Acute visit   Patient: Bonnie Shepard   DOB: April 12, 1985   38 y.o. Female  MRN: [id number] PCP: Ostwalt, Janna, PA-C   Chief Complaint  Patient presents with   Follow-up    She is better but still not able to eat/hold things down, she was told she had a viral infections and mild pneumonia, she now things she has a sinus infections and requesting treatment        Subjective    Discussed the use of AI scribe software for clinical note transcription with the patient, who gave verbal consent to proceed.  History of Present Illness   Bonnie Shepard is a 38 year old female who presents with persistent nausea and sinus symptoms following a recent ER visit for a reaction to contrast dye.  She experiences persistent nausea, particularly in the mornings, which she manages with Zofran  before getting out of bed. Chicken broth provides some relief. Sinus symptoms began yesterday, initially managed with Tylenol  Sinus, but worsened by bedtime. She feels sinus pressure and uses over-the-counter medications.  She had a reaction to contrast dye during a recent ER visit, which is now listed as an allergy. She was diagnosed with mild pneumonia after experiencing nausea and vomiting at work.  She is concerned about her electrolyte levels, as low potassium was noted during her ER visit. She awaits further lab results.  She reports irritation and numbness of her tongue, possibly due to frequent brushing and acid exposure from vomiting. She experiences numbness in her arms in certain positions, resolving with movement.  She is an active participant in adult travel softball and is concerned about her health impacting her ability to play and umpire games.        Review of Systems  Objective    BP 122/77   Pulse 64   Ht 5' 2 (1.575 m)   Wt 229 lb (103.9 kg)   LMP 08/06/2023   SpO2 100%   BMI 41.88 kg/m  Physical Exam Vitals reviewed.  Constitutional:      General: She is not in acute distress.     Appearance: Normal appearance. She is well-developed. She is not diaphoretic.  HENT:     Head: Normocephalic and atraumatic.     Right Ear: Tympanic membrane, ear canal and external ear normal.     Left Ear: Tympanic membrane, ear canal and external ear normal.     Nose: Nose normal.     Mouth/Throat:     Mouth: Mucous membranes are moist.     Pharynx: Oropharynx is clear. No oropharyngeal exudate.     Comments: Tongue irritation, no lesions Eyes:     General: No scleral icterus.    Conjunctiva/sclera: Conjunctivae normal.     Pupils: Pupils are equal, round, and reactive to light.  Cardiovascular:     Rate and Rhythm: Normal rate and regular rhythm.     Heart sounds: Normal heart sounds. No murmur heard. Pulmonary:     Effort: Pulmonary effort is normal. No respiratory distress.     Breath sounds: Normal breath sounds. No wheezing or rales.  Abdominal:     General: There is no distension.     Palpations: Abdomen is soft.     Tenderness: There is no abdominal tenderness.  Musculoskeletal:     Cervical back: Neck supple.     Right lower leg: No edema.     Left lower leg: No edema.  Lymphadenopathy:     Cervical: No cervical  adenopathy.  Skin:    General: Skin is warm and dry.     Findings: No rash.  Neurological:     Mental Status: She is alert.       No results found for any visits on 08/17/23.  Assessment & Plan     Problem List Items Addressed This Visit   None Visit Diagnoses       Hypokalemia    -  Primary   Relevant Orders   Basic Metabolic Panel (BMET)     Viral gastroenteritis         Viral URI         Tongue irritation               Viral upper respiratory infection with gastrointestinal symptoms Symptoms include nausea, vomiting, and sinus congestion. Negative for COVID-19 and influenza. Likely viral etiology, possibly parainfluenza, adenovirus, or rhinovirus. No evidence of bacterial sinus infection. - Continue Zofran  for nausea management. -  Consume chicken broth and bland foods to ease gastrointestinal symptoms. - Use Mucinex, Tylenol , Advil , or cold and sinus medications for symptom relief. - Consider nasal spray to reduce sinus inflammation. - If sinus symptoms persist beyond 7-10 days, consider antibiotics.  Electrolyte imbalance (hypokalemia) Low potassium levels likely due to vomiting. Symptoms include hand cramping and weakness. - Order potassium level test. - Administer potassium supplementation if levels remain low. - Monitor for improvement in symptoms.  Irritation of tongue secondary to vomiting and brushing Tongue irritation likely due to excessive brushing and acid exposure from vomiting. Symptoms include numbness and irritation of the tongue. - Reassure that the tongue will heal naturally over time.  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 Symptoms improve with movement and repositioning. - Advise on repositioning to alleviate symptoms. - Reassure that symptoms are not concerning if she resolves with movement.  Contrast media allergy Allergic reaction to contrast media noted during recent ER visit. Allergy documented in medical record. - Ensure contrast media allergy is documented in medical record. - Consider pre-treatment with Benadryl  and prednisone  if contrast is needed in the future.        No orders of the defined types were placed in this encounter.    Return if symptoms worsen or fail to improve.      Jon Eva, MD  [facility] [phone] (phone) [phone] (fax)  [facility]

## 2023-08-18 ENCOUNTER — Ambulatory Visit: Payer: Self-pay | Admitting: Family Medicine

## 2023-08-18 LAB — BASIC METABOLIC PANEL WITH GFR
BUN/Creatinine Ratio: 16 (ref 9–23)
BUN: 11 mg/dL (ref 6–20)
CO2: 24 mmol/L (ref 20–29)
Calcium: 8.8 mg/dL (ref 8.7–10.2)
Chloride: 98 mmol/L (ref 96–106)
Creatinine, Ser: 0.67 mg/dL (ref 0.57–1.00)
Glucose: 107 mg/dL — ABNORMAL HIGH (ref 70–99)
Potassium: 3.7 mmol/L (ref 3.5–5.2)
Sodium: 138 mmol/L (ref 134–144)
eGFR: 115 mL/min/1.73 (ref >59)

## 2023-11-01 ENCOUNTER — Other Ambulatory Visit: Payer: Self-pay | Admitting: Physician Assistant

## 2023-11-01 DIAGNOSIS — K219 Gastro-esophageal reflux disease without esophagitis: Secondary | ICD-10-CM

## 2023-11-02 NOTE — Telephone Encounter (Signed)
 LOV- 02/23/2023 NOV- 11/19/2023 LRF- 11/16/2022   Disp Refills Start End   omeprazole  (PRILOSEC  OTC) 20 MG tablet 28 tablet 1 11/16/2022 11/16/2023   Sig - Route: Take 1 tablet (20 mg total) by mouth daily. - Oral   Sent to pharmacy as: omeprazole  (PRILOSEC  OTC) 20 MG tablet   E-Prescribing Status: Receipt confirmed by pharmacy (11/16/2022  3:51 PM EST)

## 2023-11-19 ENCOUNTER — Encounter: Payer: Self-pay | Admitting: Physician Assistant

## 2023-11-29 ENCOUNTER — Emergency Department
Admission: EM | Admit: 2023-11-29 | Discharge: 2023-11-29 | Disposition: A | Discharge status: Home / Self Care | Attending: Emergency Medicine | Admitting: Emergency Medicine

## 2023-11-29 ENCOUNTER — Other Ambulatory Visit: Payer: Self-pay

## 2023-11-29 DIAGNOSIS — R112 Nausea with vomiting, unspecified: Secondary | ICD-10-CM | POA: Diagnosis not present

## 2023-11-29 DIAGNOSIS — K529 Noninfective gastroenteritis and colitis, unspecified: Secondary | ICD-10-CM | POA: Diagnosis not present

## 2023-11-29 LAB — COMPREHENSIVE METABOLIC PANEL WITH GFR
ALT: 48 U/L — ABNORMAL HIGH (ref 0–44)
AST: 35 U/L (ref 15–41)
Albumin: 4.6 g/dL (ref 3.5–5.0)
Alkaline Phosphatase: 91 U/L (ref 38–126)
Anion gap: 13 (ref 5–15)
BUN: 16 mg/dL (ref 6–20)
CO2: 28 mmol/L (ref 22–32)
Calcium: 9.6 mg/dL (ref 8.9–10.3)
Chloride: 97 mmol/L — ABNORMAL LOW (ref 98–111)
Creatinine, Ser: 0.79 mg/dL (ref 0.44–1.00)
GFR, Estimated: >60 mL/min (ref >60)
Glucose, Bld: 131 mg/dL — ABNORMAL HIGH (ref 70–99)
Potassium: 3.7 mmol/L (ref 3.5–5.1)
Sodium: 137 mmol/L (ref 135–145)
Total Bilirubin: 0.8 mg/dL (ref 0.0–1.2)
Total Protein: 8.1 g/dL (ref 6.5–8.1)

## 2023-11-29 LAB — URINALYSIS, ROUTINE W REFLEX MICROSCOPIC
Bilirubin Urine: NEGATIVE
Glucose, UA: NEGATIVE mg/dL
Ketones, ur: 20 mg/dL — AB
Nitrite: NEGATIVE
Protein, ur: 100 mg/dL — AB
Specific Gravity, Urine: 1.019 (ref 1.005–1.030)
pH: 9 — ABNORMAL HIGH (ref 5.0–8.0)

## 2023-11-29 LAB — CBC
HCT: 45.5 % (ref 36.0–46.0)
Hemoglobin: 15.9 g/dL — ABNORMAL HIGH (ref 12.0–15.0)
MCH: 31.1 pg (ref 26.0–34.0)
MCHC: 34.9 g/dL (ref 30.0–36.0)
MCV: 88.9 fL (ref 80.0–100.0)
Platelets: 306 K/uL (ref 150–400)
RBC: 5.12 MIL/uL — ABNORMAL HIGH (ref 3.87–5.11)
RDW: 12 % (ref 11.5–15.5)
WBC: 13.5 K/uL — ABNORMAL HIGH (ref 4.0–10.5)
nRBC: 0 % (ref 0.0–0.2)

## 2023-11-29 LAB — LIPASE, BLOOD: Lipase: 22 U/L (ref 11–51)

## 2023-11-29 MED ORDER — ONDANSETRON HCL 4 MG/2ML IJ SOLN
4.0000 mg | Freq: Once | INTRAMUSCULAR | Status: AC
  Administered 2023-11-29: 4 mg via INTRAVENOUS
  Filled 2023-11-29: qty 2

## 2023-11-29 MED ORDER — ONDANSETRON 4 MG PO TBDP: 4.0000 mg | ORAL_TABLET | Freq: Three times a day (TID) | ORAL | 0 refills | Status: AC | PRN

## 2023-11-29 MED ORDER — SODIUM CHLORIDE 0.9 % IV SOLN: Freq: Once | INTRAVENOUS | Status: AC

## 2023-11-29 NOTE — ED Notes (Signed)
 Pt ambulatory to restroom

## 2023-11-29 NOTE — ED Notes (Signed)
 Called to patient room. Pt asking for emesis bag. Pt states I have been holding it in. Pt then placed emesis bag near her mouth. This RN asked patient if she is still nauseous and the patient responded yes

## 2023-11-29 NOTE — ED Triage Notes (Signed)
 Patient states abdominal pain and vomiting since Saturday; had 5 episodes of diarrhea Saturday as well but denies any since.

## 2023-11-29 NOTE — ED Provider Notes (Signed)
 [facility] Provider Note    Event Date/Time   First MD Initiated Contact with Patient 11/29/23 1922     (approximate)   History   Emesis   HPI  Bonnie Shepard is a 38 y.o. female who presents with nausea vomiting diarrhea for the last 2 days.  Intermittent abdominal cramping.  No significant abdominal pain.     Physical Exam   Triage Vital Signs: ED Triage Vitals  Encounter Vitals Group     BP 11/29/23 1734 (!) 177/89     Girls Systolic BP Percentile --      Girls Diastolic BP Percentile --      Boys Systolic BP Percentile --      Boys Diastolic BP Percentile --      Pulse Rate 11/29/23 1734 (!) 52     Resp 11/29/23 1734 18     Temp 11/29/23 1734 98.3 F (36.8 C)     Temp Source 11/29/23 1734 Oral     SpO2 11/29/23 1734 98 %     Weight 11/29/23 1736 104.3 kg (230 lb)     Height 11/29/23 1736 1.575 m (5' 2)     Head Circumference --      Peak Flow --      Pain Score 11/29/23 1734 7     Pain Loc --      Pain Education --      Exclude from Growth Chart --     Most recent vital signs: Vitals:   11/29/23 1734 11/29/23 2210  BP: (!) 177/89 (!) 186/94  Pulse: (!) 52 (!) 49  Resp: 18 18  Temp: 98.3 F (36.8 C)   SpO2: 98% 99%     General: Awake, no distress.  CV:  Good peripheral perfusion.  Resp:  Normal effort.  Abd:  No distention.  Soft, nontender Other:     ED Results / Procedures / Treatments   Labs (all labs ordered are listed, but only abnormal results are displayed) Labs Reviewed  COMPREHENSIVE METABOLIC PANEL WITH GFR - Abnormal; Notable for the following components:      Result Value   Chloride 97 (*)    Glucose, Bld 131 (*)    ALT 48 (*)    All other components within normal limits  CBC - Abnormal; Notable for the following components:   WBC 13.5 (*)    RBC 5.12 (*)    Hemoglobin 15.9 (*)    All other components within normal limits  URINALYSIS, ROUTINE W REFLEX MICROSCOPIC - Abnormal; Notable for the  following components:   Color, Urine YELLOW (*)    APPearance CLOUDY (*)    pH 9.0 (*)    Hgb urine dipstick SMALL (*)    Ketones, ur 20 (*)    Protein, ur 100 (*)    Leukocytes,Ua TRACE (*)    Bacteria, UA RARE (*)    All other components within normal limits  LIPASE, BLOOD  POC URINE PREG, ED     EKG     RADIOLOGY     PROCEDURES:  Critical Care performed:   Procedures   MEDICATIONS ORDERED IN ED: Medications  0.9 %  sodium chloride  infusion (0 mLs Intravenous Stopped 11/29/23 2134)  ondansetron  (ZOFRAN ) injection 4 mg (4 mg Intravenous Given 11/29/23 2014)  ondansetron  (ZOFRAN ) injection 4 mg (4 mg Intravenous Given 11/29/23 2101)     IMPRESSION / MDM / ASSESSMENT AND PLAN / ED COURSE  I reviewed the triage vital signs and  the nursing notes. Patient's presentation is most consistent with acute illness / injury with system symptoms.  Patient presents with nausea vomiting diarrhea as detailed above, differential includes foodborne illness versus viral gastroenteritis, suspect viral gastroenteritis which is common in the community at this time.  Will treat with IV fluids, IV Zofran   Lab work reviewed and is overall reassuring.  Patient reports feeling better to the nurse, she is anxious to leave, appropriate for discharge at this time ODT Zofran  prescribed     FINAL CLINICAL IMPRESSION(S) / ED DIAGNOSES   Final diagnoses:  Gastroenteritis     Rx / DC Orders   ED Discharge Orders          Ordered    ondansetron  (ZOFRAN -ODT) 4 MG disintegrating tablet  Every 8 hours PRN        11/29/23 2205             Note:  This document was prepared using Dragon voice recognition software and may include unintentional dictation errors.   Arlander Charleston, MD 11/29/23 2230

## 2023-11-30 DIAGNOSIS — D72829 Elevated white blood cell count, unspecified: Secondary | ICD-10-CM | POA: Diagnosis not present

## 2023-11-30 DIAGNOSIS — E860 Dehydration: Secondary | ICD-10-CM | POA: Diagnosis not present

## 2023-11-30 DIAGNOSIS — I10 Essential (primary) hypertension: Secondary | ICD-10-CM | POA: Diagnosis not present

## 2023-11-30 DIAGNOSIS — R7989 Other specified abnormal findings of blood chemistry: Secondary | ICD-10-CM | POA: Diagnosis not present

## 2023-11-30 DIAGNOSIS — I2489 Other forms of acute ischemic heart disease: Secondary | ICD-10-CM | POA: Diagnosis not present

## 2023-11-30 DIAGNOSIS — R1084 Generalized abdominal pain: Secondary | ICD-10-CM | POA: Diagnosis not present

## 2023-11-30 DIAGNOSIS — R1110 Vomiting, unspecified: Secondary | ICD-10-CM | POA: Diagnosis not present

## 2023-11-30 DIAGNOSIS — F17210 Nicotine dependence, cigarettes, uncomplicated: Secondary | ICD-10-CM | POA: Diagnosis not present

## 2023-11-30 DIAGNOSIS — R918 Other nonspecific abnormal finding of lung field: Secondary | ICD-10-CM | POA: Diagnosis not present

## 2023-11-30 DIAGNOSIS — K649 Unspecified hemorrhoids: Secondary | ICD-10-CM | POA: Diagnosis not present

## 2023-11-30 DIAGNOSIS — K219 Gastro-esophageal reflux disease without esophagitis: Secondary | ICD-10-CM | POA: Diagnosis not present

## 2023-11-30 DIAGNOSIS — R0602 Shortness of breath: Secondary | ICD-10-CM | POA: Diagnosis not present

## 2023-11-30 DIAGNOSIS — R109 Unspecified abdominal pain: Secondary | ICD-10-CM | POA: Diagnosis not present

## 2023-11-30 DIAGNOSIS — E669 Obesity, unspecified: Secondary | ICD-10-CM | POA: Diagnosis not present

## 2023-11-30 DIAGNOSIS — E876 Hypokalemia: Secondary | ICD-10-CM | POA: Diagnosis not present

## 2023-11-30 DIAGNOSIS — R1013 Epigastric pain: Secondary | ICD-10-CM | POA: Diagnosis not present

## 2023-11-30 DIAGNOSIS — R001 Bradycardia, unspecified: Secondary | ICD-10-CM | POA: Diagnosis not present

## 2023-11-30 DIAGNOSIS — R079 Chest pain, unspecified: Secondary | ICD-10-CM | POA: Diagnosis not present

## 2023-11-30 DIAGNOSIS — K529 Noninfective gastroenteritis and colitis, unspecified: Secondary | ICD-10-CM | POA: Diagnosis not present

## 2023-11-30 DIAGNOSIS — R42 Dizziness and giddiness: Secondary | ICD-10-CM | POA: Diagnosis not present

## 2023-11-30 DIAGNOSIS — Z79899 Other long term (current) drug therapy: Secondary | ICD-10-CM | POA: Diagnosis not present

## 2023-11-30 DIAGNOSIS — R112 Nausea with vomiting, unspecified: Secondary | ICD-10-CM | POA: Diagnosis not present

## 2023-11-30 DIAGNOSIS — Z6841 Body Mass Index (BMI) 40.0 and over, adult: Secondary | ICD-10-CM | POA: Diagnosis not present

## 2023-12-01 DIAGNOSIS — R112 Nausea with vomiting, unspecified: Secondary | ICD-10-CM | POA: Diagnosis not present

## 2023-12-01 DIAGNOSIS — S8991XA Unspecified injury of right lower leg, initial encounter: Secondary | ICD-10-CM | POA: Diagnosis not present

## 2023-12-01 DIAGNOSIS — M19071 Primary osteoarthritis, right ankle and foot: Secondary | ICD-10-CM | POA: Diagnosis not present

## 2023-12-01 DIAGNOSIS — R079 Chest pain, unspecified: Secondary | ICD-10-CM | POA: Diagnosis not present

## 2023-12-04 DIAGNOSIS — R079 Chest pain, unspecified: Secondary | ICD-10-CM | POA: Diagnosis not present

## 2023-12-05 DIAGNOSIS — R079 Chest pain, unspecified: Secondary | ICD-10-CM | POA: Diagnosis not present

## 2023-12-16 ENCOUNTER — Ambulatory Visit (INDEPENDENT_AMBULATORY_CARE_PROVIDER_SITE_OTHER): Admitting: Physician Assistant

## 2023-12-16 VITALS — BP 120/67 | HR 86 | Temp 98.1°F | Resp 14 | Ht 63.0 in | Wt 231.4 lb

## 2023-12-16 DIAGNOSIS — R058 Other specified cough: Secondary | ICD-10-CM | POA: Diagnosis not present

## 2023-12-16 DIAGNOSIS — R197 Diarrhea, unspecified: Secondary | ICD-10-CM | POA: Insufficient documentation

## 2023-12-16 DIAGNOSIS — Z09 Encounter for follow-up examination after completed treatment for conditions other than malignant neoplasm: Secondary | ICD-10-CM | POA: Diagnosis not present

## 2023-12-16 DIAGNOSIS — R110 Nausea: Secondary | ICD-10-CM

## 2023-12-16 DIAGNOSIS — K625 Hemorrhage of anus and rectum: Secondary | ICD-10-CM

## 2023-12-16 DIAGNOSIS — K219 Gastro-esophageal reflux disease without esophagitis: Secondary | ICD-10-CM

## 2023-12-16 DIAGNOSIS — F17200 Nicotine dependence, unspecified, uncomplicated: Secondary | ICD-10-CM | POA: Diagnosis not present

## 2023-12-16 DIAGNOSIS — R350 Frequency of micturition: Secondary | ICD-10-CM | POA: Diagnosis not present

## 2023-12-16 DIAGNOSIS — R82998 Other abnormal findings in urine: Secondary | ICD-10-CM | POA: Diagnosis not present

## 2023-12-16 DIAGNOSIS — R062 Wheezing: Secondary | ICD-10-CM | POA: Insufficient documentation

## 2023-12-16 DIAGNOSIS — J45901 Unspecified asthma with (acute) exacerbation: Secondary | ICD-10-CM

## 2023-12-16 DIAGNOSIS — R059 Cough, unspecified: Secondary | ICD-10-CM | POA: Insufficient documentation

## 2023-12-16 DIAGNOSIS — J209 Acute bronchitis, unspecified: Secondary | ICD-10-CM | POA: Insufficient documentation

## 2023-12-16 MED ORDER — PREDNISONE 20 MG PO TABS: 20.0000 mg | ORAL_TABLET | Freq: Every day | ORAL | 0 refills | Status: DC

## 2023-12-16 MED ORDER — ALBUTEROL SULFATE HFA 108 (90 BASE) MCG/ACT IN AERS: 2.0000 | INHALATION_SPRAY | Freq: Four times a day (QID) | RESPIRATORY_TRACT | 2 refills | Status: AC | PRN

## 2023-12-16 MED ORDER — AZITHROMYCIN 250 MG PO TABS: ORAL_TABLET | ORAL | 0 refills | Status: AC

## 2023-12-16 NOTE — Progress Notes (Unsigned)
 Established patient visit  Patient: Bonnie Shepard   DOB: 11/20/1985   38 y.o. Female  MRN: [id number] Visit Date: 12/16/2023  Today's healthcare provider: Jolynn Spencer, PA-C   Chief Complaint  Patient presents with   GERD Wheezing and cough Dark urine and increased urinary frequency    Last Exam Physical: 11/16/2022 Diet: bland food, lots of fruits Exercise: 3 days a week  Feeling: a little sick, reports nausea every morning Sleeping: 6 hours  No concerns   Subjective    Bonnie Shepard is a 38 y.o. female who presents today for a hospital discharge follow-up for acute exacerbation of GERD .   Discussed the use of AI scribe software for clinical note transcription with the patient, who gave verbal consent to proceed.  History of Present Illness Bonnie Shepard is a 38 year old female with gastroesophageal reflux disease who presents with persistent nausea and wheezing.  She has persistent morning nausea with occasional vomiting despite omeprazole  20 mg and over-the-counter Prilosec  once daily, which give only partial relief. She was hospitalized before Thanksgiving for dehydration and elevated troponin and was started on Zofran  and famotidine  20 mg twice daily, but nausea continues.  She has had bradycardia for about three years, which was again noted during the recent hospitalization, when she was dehydrated with low potassium and had difficulty taking medications. She currently denies chest pain, shortness of breath, or palpitations.  She reports wheezing for the past month. 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 She denies current shortness of breath but had some difficulty taking deep breaths when she was recently ill.  Before her recent illness she had an episode of rectal bleeding that was not active during the hospital stay. She also has belching and bloating related to GERD but denies dysphagia. Bowel movements occur twice daily  without fever or significant diarrhea.  She has had dark urine and increased urinary frequency, with stress incontinence when coughing or jumping. She denies dysuria or vaginal discharge. She is a former smoker and now smokes socially, about two cigarettes on occasion.  Follow up Hospitalization  Patient was admitted to [facility] on 11/30/23 and discharged on 12/01/23. She was treated for nausea, vomiting, elevated troponin, GERD, HTN, gastroenteritis. Treatment for this included observation for elevated troponin, IV fluids, antiemetics, acid suppression therapy, IV PPI and famotidine , losartan . Telephone follow up was done: NO She reports fair compliance with treatment. She reports this condition is improved.  Last depression screening scores    12/16/2023    3:36 PM 02/23/2023    8:24 AM 09/16/2022    9:32 AM  PHQ 2/9 Scores  PHQ - 2 Score 2 1 1   PHQ- 9 Score 8 6  12       Data saved with a previous flowsheet row definition   Last fall risk screening    12/16/2023    3:36 PM  Fall Risk   Falls in the past year? 0  Number falls in past yr: 0  Injury with Fall? 0  Risk for fall due to : No Fall Risks  Follow up Falls evaluation completed   Last Audit-C alcohol use screening    11/16/2022    1:13 PM  Alcohol Use Disorder Test (AUDIT)  1. How often do you have a drink containing alcohol? 0   2. How many drinks containing alcohol do you have on a typical day when you are drinking? 0   3.  How often do you have six or more drinks on one occasion? 0   AUDIT-C Score 0      Manually entered by patient   A score of 3 or more in women, and 4 or more in men indicates increased risk for alcohol abuse, EXCEPT if all of the points are from question 1   Past Medical History:  Diagnosis Date   Acid reflux    Asthma    Chest pain    a. 08/2022 ETT: Ex time 7:18. Max HR 169. No acute ST/T changes.   Diverticulitis    Family history of adverse reaction to anesthesia    Mother - PONV    Hemorrhoids    History of echocardiogram    a. 04/2020 Echo: EF 60-65%, no rwma, nl RV size/fxn.   Hypertension    Infection    freq UTI   Palpitations    Tobacco abuse    Vaginal Pap smear, abnormal    f/u wnl   Wears contact lenses    Past Surgical History:  Procedure Laterality Date   COLONOSCOPY WITH PROPOFOL  N/A 04/03/2019   Procedure: COLONOSCOPY WITH PROPOFOL ;  Surgeon: Jinny Carmine, MD;  Location: [facility];  Service: Endoscopy;  Laterality: N/A;  Priority 3   DILATION AND EVACUATION N/A 09/07/2013   Procedure: DILATATION AND EVACUATION;  Surgeon: Marjorie DEL. Okey, MD;  Location: [facility];  Service: Gynecology;  Laterality: N/A;   ESOPHAGOGASTRODUODENOSCOPY (EGD) WITH PROPOFOL  N/A 04/04/2020   Procedure: ESOPHAGOGASTRODUODENOSCOPY (EGD) WITH BIOPSY;  Surgeon: Jinny Carmine, MD;  Location: [facility];  Service: Endoscopy;  Laterality: N/A;   TONSILLECTOMY     Social History   Socioeconomic History   Marital status: Single    Spouse name: Not on file   Number of children: Not on file   Years of education: Not on file   Highest education level: GED or equivalent  Occupational History   Not on file  Tobacco Use   Smoking status: Some Days    Current packs/day: 0.25    Average packs/day: 0.3 packs/day for 16.0 years (4.0 ttl pk-yrs)    Types: Cigarettes   Smokeless tobacco: Never   Tobacco comments:    since age 10    Smokes 3 cigarettes daily;10/09/2022   Vaping Use   Vaping status: Some Days  Substance and Sexual Activity   Alcohol use: Yes    Alcohol/week: 1.0 standard drink of alcohol    Types: 1 Cans of beer per week    Comment: occasionally   Drug use: No   Sexual activity: Not Currently    Birth control/protection: None  Other Topics Concern   Not on file  Social History Narrative   Not on file   Social Drivers of Health   Tobacco Use: High Risk (11/30/2023)   Received from [facility]   Patient History    Smoking Tobacco Use: Some  Days    Smokeless Tobacco Use: Never    Passive Exposure: Not on file  Financial Resource Strain: Low Risk (11/16/2022)   Overall Financial Resource Strain (CARDIA)    Difficulty of Paying Living Expenses: Not very hard  Food Insecurity: No Food Insecurity (11/16/2022)   Hunger Vital Sign    Worried About Running Out of Food in the Last Year: Never true    Ran Out of Food in the Last Year: Never true  Transportation Needs: No Transportation Needs (11/16/2022)   PRAPARE - Transportation    Lack of Transportation (  Medical): No    Lack of Transportation (Non-Medical): No  Physical Activity: Insufficiently Active (11/16/2022)   Exercise Vital Sign    Days of Exercise per Week: 2 days    Minutes of Exercise per Session: 30 min  Stress: No Stress Concern Present (11/16/2022)   [employer] of Occupational Health - Occupational Stress Questionnaire    Feeling of Stress : Only a little  Social Connections: Moderately Integrated (11/16/2022)   Social Connection and Isolation Panel    Frequency of Communication with Friends and Family: More than three times a week    Frequency of Social Gatherings with Friends and Family: Once a week    Attends Religious Services: More than 4 times per year    Active Member of Clubs or Organizations: Yes    Attends Banker Meetings: More than 4 times per year    Marital Status: Never married  Intimate Partner Violence: Not on file  Depression (PHQ2-9): Medium Risk (12/16/2023)   Depression (PHQ2-9)    PHQ-2 Score: 8  Alcohol Screen: Low Risk (11/16/2022)   Alcohol Screen    Last Alcohol Screening Score (AUDIT): 0  Housing: Unknown (08/12/2023)   Received [facility]   Epic    Unable to Pay for Housing in the Last Year: Not on file    Number of Times Moved in the Last Year: Not on file    At any time in the past 12 months, were you homeless or living in a shelter (including now)?: Patient unable to answer   Utilities: Not on file  Health Literacy: Not on file   Family Status  Relation Name Status   Mother  Alive   Father  Alive   MGM  (Not Specified)   MGF  Deceased   PGM  (Not Specified)   PGF  (Not Specified)   Other  (Not Specified)   Neg Hx  (Not Specified)  No partnership data on file   Family History  Problem Relation Age of Onset   Hypertension Mother    Clotting disorder Mother    Diabetes Father    Hypertension Father    Stroke Maternal Grandmother    COPD Maternal Grandmother    Hypertension Maternal Grandmother    Heart disease Maternal Grandmother    Heart disease Maternal Grandfather    Heart attack Maternal Grandfather    Arthritis Paternal Grandmother    Alzheimer's disease Paternal Grandmother    Heart disease Paternal Grandfather    Hypertension Other    Cancer Other    Hearing loss Neg Hx    Breast cancer Neg Hx    Allergies[1]  Patient Care Team: Angy Swearengin, PA-C as PCP - General (Physician Assistant) Anner Alm ORN, MD as PCP - Cardiology (Cardiology)   Medications: Show/hide medication list[2]  Review of Systems  All other systems reviewed and are negative.  Except see HPI     Objective    BP 120/67   Pulse 86   Temp 98.1 F (36.7 C) (Oral)   Resp 14   Ht 5' 3 (1.6 m)   Wt 231 lb 6.4 oz (105 kg)   LMP 11/08/2023   SpO2 98%   BMI 40.99 kg/m      Physical Exam Vitals reviewed.  Constitutional:      General: She is not in acute distress.    Appearance: Normal appearance. She is well-developed. She is not diaphoretic.  HENT:     Head: Normocephalic and atraumatic.  Eyes:     General: No scleral icterus.    Conjunctiva/sclera: Conjunctivae normal.  Neck:     Thyroid : No thyromegaly.  Cardiovascular:     Rate and Rhythm: Normal rate and regular rhythm.     Pulses: Normal pulses.     Heart sounds: Normal heart sounds. No murmur heard. Pulmonary:     Effort: Pulmonary effort is normal. No respiratory distress.      Breath sounds: Normal breath sounds. No wheezing, rhonchi or rales.  Musculoskeletal:     Cervical back: Neck supple.     Right lower leg: No edema.     Left lower leg: No edema.  Lymphadenopathy:     Cervical: No cervical adenopathy.  Skin:    General: Skin is warm and dry.     Findings: No rash.  Neurological:     Mental Status: She is alert and oriented to person, place, and time. Mental status is at baseline.  Psychiatric:        Mood and Affect: Mood normal.        Behavior: Behavior normal.      No results found for any visits on 12/16/23.  Assessment & Plan    Routine Health Maintenance and Physical Exam  Exercise Activities and Dietary recommendations  Goals   None     Immunization History  Administered Date(s) Administered   Hep B, Unspecified 10/12/1997, 11/16/1997, 04/19/1998   Influenza-Unspecified 09/11/2017   PFIZER(Purple Top)SARS-COV-2 Vaccination 01/17/2019, 02/06/2019   Tdap 09/18/2015    Health Maintenance  Topic Date Due   Complete foot exam   Never done   Yearly kidney health urinalysis for diabetes  Never done   Pneumococcal Vaccine (1 of 2 - PCV) Never done   HPV Vaccine (1 - 3-dose SCDM series) Never done   Hemoglobin A1C  08/23/2023   COVID-19 Vaccine (3 - 2025-26 season) 09/06/2023   Eye exam for diabetics  12/16/2023*   Flu Shot  04/04/2024*   Pap with HPV screening  02/21/2024   Yearly kidney function blood test for diabetes  11/28/2024   DTaP/Tdap/Td vaccine (2 - Td or Tdap) 09/17/2025   Hepatitis C Screening  Completed   HIV Screening  Completed   Meningitis B Vaccine  Aged Out  *Topic was postponed. The date shown is not the original due date.    Discussed health benefits of physical activity, and encouraged her to engage in regular exercise appropriate for her age and condition. Assessment & Plan Gastroesophageal reflux disease with persistent nausea Chronic Persistent nausea despite omeprazole  and famotidine . Symptoms  suggest GERD exacerbation. Referral to gastroenterology pending. - Increased omeprazole  to twice daily. - Continue famotidine  to twice daily. - Avoid trigger food, avoid eating before bed. - Avoid tight clothing, eat until 80% full. - Expedite gastroenterology referral. Will follow-up  Rectal bleeding History of rectal bleeding. No active bleeding. Gastroenterology referral pending. - Expedite gastroenterology referral.  Acute bronchitis  vs Acute Exacerbation of asthma Persistent wheezing and cough. No current shortness of breath. Prednisone  considered; antibiotics may worsen GI symptoms. - Prescribed prednisone  20 mg twice daily for 5 days. - Continue albuterol  inhaler as needed. - Consider additional inhaler for bronchitis. - Monitor for GI side effects with antibiotics. Consider  chest XR Will follow-up  Urinary frequency and dark urine Increased frequency and dark urine. Could be due to recent diarrhea/without blood or fever. No burning or strong smell. - Ordered urinalysis to evaluate for UTI.  Tobacco use Current day smoker, see  table above Cessation advised  Rectal bleeding (Primary)  - Hemoglobin A1c - CBC with Differential/Platelet - Lipid Panel With LDL/HDL Ratio - Urine Microalbumin w/creat. ratio - Ambulatory referral to Gastroenterology  Gastroesophageal reflux disease, unspecified whether esophagitis present  - Ambulatory referral to Gastroenterology Nausea  - Ambulatory referral to Gastroenterology  Acute bronchitis, unspecified organism  - predniSONE  (DELTASONE ) 20 MG tablet; Take 1 tablet (20 mg total) by mouth daily with breakfast.  Dispense: 10 tablet; Refill: 0 - azithromycin  (ZITHROMAX ) 250 MG tablet; Take 2 tablets on day 1, then 1 tablet daily on days 2 through 5  Dispense: 6 tablet; Refill: 0   Dark urine  - Urinalysis, Routine w reflex microscopic  Urinary frequency  - Urinalysis, Routine w reflex microscopic   Wheezing  -  albuterol  (VENTOLIN  HFA) 108 (90 Base) MCG/ACT inhaler; Inhale 2 puffs into the lungs every 6 (six) hours as needed for wheezing or shortness of breath.  Dispense: 8 g; Refill: 2 - fluticasone -salmeterol (WIXELA INHUB) 250-50 MCG/ACT AEPB; Inhale 1 puff into the lungs in the morning and at bedtime.  Dispense: 60 each; Refill: 0  Other cough  - albuterol  (VENTOLIN  HFA) 108 (90 Base) MCG/ACT inhaler; Inhale 2 puffs into the lungs every 6 (six) hours as needed for wheezing or shortness of breath.  Dispense: 8 g; Refill: 2 - fluticasone -salmeterol (WIXELA INHUB) 250-50 MCG/ACT AEPB; Inhale 1 puff into the lungs in the morning and at bedtime.  Dispense: 60 each; Refill: 0  Exacerbation of asthma, unspecified asthma severity, unspecified whether persistent  - fluticasone -salmeterol (WIXELA INHUB) 250-50 MCG/ACT AEPB; Inhale 1 puff into the lungs in the morning and at bedtime.  Dispense: 60 each; Refill: 0  Hospital discharge follow-up TOC - not on file Referral to GI was placed for rectal bleeding and GERD/unstable, see above  No follow-ups on file.    The patient was advised to call back or seek an in-person evaluation if the symptoms worsen or if the condition fails to improve as anticipated.  I discussed the assessment and treatment plan with the patient. The patient was provided an opportunity to ask questions and all were answered. The patient agreed with the plan and demonstrated an understanding of the instructions.  I, Connie Lasater, PA-C have reviewed all documentation for this visit. The documentation on 12/16/2023  for the exam, diagnosis, procedures, and orders are all accurate and complete.  Jolynn Spencer, [facility], MMS [facility] [phone] (phone) [phone] (fax)  [facility]     [1]  Allergies Allergen Reactions   Ivp Dye [Iodinated Contrast Media] Hives    Possible delayed anaphylaxis on re-eval 08/12/23  [2]  Outpatient Medications Prior  to Visit  Medication Sig   albuterol  (VENTOLIN  HFA) 108 (90 Base) MCG/ACT inhaler Inhale 2 puffs into the lungs every 6 (six) hours as needed for wheezing or shortness of breath.   EPINEPHrine  0.3 mg/0.3 mL IJ SOAJ injection Inject 0.3 mg into the muscle as needed for anaphylaxis.   hydrochlorothiazide  (MICROZIDE ) 12.5 MG capsule Take 1 capsule (12.5 mg total) by mouth daily.   lidocaine  (LIDODERM ) 5 % Place 1 patch onto the skin daily. Remove & Discard patch within 12 hours or as directed by MD   losartan  (COZAAR ) 25 MG tablet Take 1 tablet (25 mg total) by mouth daily.   omeprazole  (PRILOSEC ) 20 MG capsule TAKE ONE CAPSULE BY MOUTH ONE TIME DAILY   ondansetron  (ZOFRAN -ODT) 4 MG disintegrating tablet Take 1 tablet (4 mg total) by  mouth every 8 (eight) hours as needed for nausea or vomiting.   [DISCONTINUED] azelastine  (ASTELIN ) 0.1 % nasal spray Place into the nose.   [DISCONTINUED] methocarbamol  (ROBAXIN ) 500 MG tablet Take 1 tablet (500 mg total) by mouth 2 (two) times daily. (Patient not taking: Reported on 08/17/2023)   [DISCONTINUED] naproxen  (NAPROSYN ) 375 MG tablet Take 1 tablet (375 mg total) by mouth 2 (two) times daily. (Patient not taking: Reported on 08/17/2023)   [DISCONTINUED] prochlorperazine  (COMPAZINE ) 10 MG tablet Take 1 tablet (10 mg total) by mouth every 6 (six) hours as needed for nausea or vomiting.   No facility-administered medications prior to visit.

## 2023-12-18 ENCOUNTER — Encounter: Payer: Self-pay | Admitting: Physician Assistant

## 2023-12-31 ENCOUNTER — Other Ambulatory Visit: Payer: Self-pay | Admitting: Physician Assistant

## 2023-12-31 DIAGNOSIS — I10 Essential (primary) hypertension: Secondary | ICD-10-CM

## 2023-12-31 DIAGNOSIS — K219 Gastro-esophageal reflux disease without esophagitis: Secondary | ICD-10-CM

## 2024-01-06 LAB — CBC WITH DIFFERENTIAL/PLATELET
Basophils Absolute: 0.1 x10E3/uL (ref 0.0–0.2)
Basos: 1 %
EOS (ABSOLUTE): 0.1 x10E3/uL (ref 0.0–0.4)
Eos: 2 %
Hematocrit: 44.5 % (ref 34.0–46.6)
Hemoglobin: 15 g/dL (ref 11.1–15.9)
Immature Grans (Abs): 0 x10E3/uL (ref 0.0–0.1)
Immature Granulocytes: 0 %
Lymphocytes Absolute: 3 x10E3/uL (ref 0.7–3.1)
Lymphs: 41 %
MCH: 31.6 pg (ref 26.6–33.0)
MCHC: 33.7 g/dL (ref 31.5–35.7)
MCV: 94 fL (ref 79–97)
Monocytes Absolute: 0.5 x10E3/uL (ref 0.1–0.9)
Monocytes: 6 %
Neutrophils Absolute: 3.7 x10E3/uL (ref 1.4–7.0)
Neutrophils: 50 %
Platelets: 240 x10E3/uL (ref 150–450)
RBC: 4.75 x10E6/uL (ref 3.77–5.28)
RDW: 11.9 % (ref 11.7–15.4)
WBC: 7.4 x10E3/uL (ref 3.4–10.8)

## 2024-01-06 LAB — URINALYSIS, ROUTINE W REFLEX MICROSCOPIC
Bilirubin, UA: NEGATIVE
Glucose, UA: NEGATIVE
Ketones, UA: NEGATIVE
Leukocytes,UA: NEGATIVE
Nitrite, UA: NEGATIVE
Specific Gravity, UA: 1.023 (ref 1.005–1.030)
Urobilinogen, Ur: 0.2 mg/dL (ref 0.2–1.0)
pH, UA: 7 (ref 5.0–7.5)

## 2024-01-06 LAB — MICROSCOPIC EXAMINATION
Casts: NONE SEEN /LPF
Epithelial Cells (non renal): >10 /HPF — AB (ref 0–10)
RBC, Urine: NONE SEEN /HPF (ref 0–2)
WBC, UA: NONE SEEN /HPF (ref 0–5)

## 2024-01-06 LAB — LIPID PANEL WITH LDL/HDL RATIO
Cholesterol, Total: 196 mg/dL (ref 100–199)
HDL: 39 mg/dL — ABNORMAL LOW
LDL Chol Calc (NIH): 138 mg/dL — ABNORMAL HIGH (ref 0–99)
LDL/HDL Ratio: 3.5 ratio — ABNORMAL HIGH (ref 0.0–3.2)
Triglycerides: 103 mg/dL (ref 0–149)
VLDL Cholesterol Cal: 19 mg/dL (ref 5–40)

## 2024-01-06 LAB — HEMOGLOBIN A1C
Est. average glucose Bld gHb Est-mCnc: 126 mg/dL
Hgb A1c MFr Bld: 6 % — ABNORMAL HIGH (ref 4.8–5.6)

## 2024-01-06 LAB — MICROALBUMIN / CREATININE URINE RATIO
Creatinine, Urine: 156.9 mg/dL
Microalb/Creat Ratio: 10 mg/g{creat} (ref 0–29)
Microalbumin, Urine: 15.6 ug/mL

## 2024-01-13 ENCOUNTER — Ambulatory Visit: Payer: Self-pay | Admitting: Physician Assistant

## 2024-01-20 ENCOUNTER — Ambulatory Visit: Admitting: Physician Assistant

## 2024-01-20 VITALS — BP 132/78 | HR 55 | Temp 98.1°F | Ht 63.0 in | Wt 231.0 lb

## 2024-01-20 DIAGNOSIS — Z0001 Encounter for general adult medical examination with abnormal findings: Secondary | ICD-10-CM | POA: Diagnosis not present

## 2024-01-20 DIAGNOSIS — I10 Essential (primary) hypertension: Secondary | ICD-10-CM | POA: Diagnosis not present

## 2024-01-20 DIAGNOSIS — R7303 Prediabetes: Secondary | ICD-10-CM

## 2024-01-20 DIAGNOSIS — J309 Allergic rhinitis, unspecified: Secondary | ICD-10-CM

## 2024-01-20 DIAGNOSIS — Z124 Encounter for screening for malignant neoplasm of cervix: Secondary | ICD-10-CM | POA: Diagnosis not present

## 2024-01-20 DIAGNOSIS — K625 Hemorrhage of anus and rectum: Secondary | ICD-10-CM

## 2024-01-20 DIAGNOSIS — F418 Other specified anxiety disorders: Secondary | ICD-10-CM

## 2024-01-20 DIAGNOSIS — K219 Gastro-esophageal reflux disease without esophagitis: Secondary | ICD-10-CM

## 2024-01-20 DIAGNOSIS — Z23 Encounter for immunization: Secondary | ICD-10-CM

## 2024-01-20 DIAGNOSIS — J4540 Moderate persistent asthma, uncomplicated: Secondary | ICD-10-CM

## 2024-01-20 DIAGNOSIS — Z0000 Encounter for general adult medical examination without abnormal findings: Secondary | ICD-10-CM

## 2024-01-20 DIAGNOSIS — E559 Vitamin D deficiency, unspecified: Secondary | ICD-10-CM

## 2024-01-20 NOTE — Progress Notes (Signed)
 "    Complete physical exam  Patient: Bonnie Shepard   DOB: [birth year]   39 y.o. Female  MRN: [id number] Visit Date: 01/20/2024  Today's healthcare provider: Jolynn Spencer, PA-C   Chief Complaint  Patient presents with   Annual Exam   Subjective    Bonnie Shepard is a 39 y.o. female who presents today for a complete physical exam.   Discussed the use of AI scribe software for clinical note transcription with the patient, who gave verbal consent to proceed.  History of Present Illness Bonnie Shepard is a 39 year old female who presents for a wellness visit.  Her gastroesophageal reflux disease has improved on omeprazole  twice daily, and she avoids spicy foods, caffeine , chocolate, tomatoes, and oranges to control symptoms.  She had rectal bleeding about four days ago, which has not recurred in the past two to three days. She has a gastroenterology appointment scheduled in February.  Her asthma is well controlled on inhalers. She rarely needs albuterol  except with softball and is training for upcoming tournaments.  She has difficulty staying asleep, typically waking after four hours and needing about an hour to fall back asleep. She remains physically active with daily softball.  She has prior abnormal Pap smears with abnormal cells but no significant findings on follow-up. She has not had a Pap smear in three to four years, does not have an OBGYN, has been celibate for six years, and does not use contraception.  She has allergy symptoms that affect her voice when singing and takes antihistamines regularly.    Last depression screening scores    12/16/2023    3:36 PM 02/23/2023    8:24 AM 09/16/2022    9:32 AM  PHQ 2/9 Scores  PHQ - 2 Score 2 1 1   PHQ- 9 Score 8 6  12       Data saved with a previous flowsheet row definition   Last fall risk screening    12/16/2023    3:36 PM  Fall Risk   Falls in the past year? 0  Number falls in past yr: 0  Injury with Fall? 0  Risk for fall  due to : No Fall Risks  Follow up Falls evaluation completed   Last Audit-C alcohol use screening    11/16/2022    1:13 PM  Alcohol Use Disorder Test (AUDIT)  1. How often do you have a drink containing alcohol? 0   2. How many drinks containing alcohol do you have on a typical day when you are drinking? 0   3. How often do you have six or more drinks on one occasion? 0   AUDIT-C Score 0      Manually entered by patient   A score of 3 or more in women, and 4 or more in men indicates increased risk for alcohol abuse, EXCEPT if all of the points are from question 1   Past Medical History:  Diagnosis Date   Acid reflux    Asthma    Chest pain    a. 08/2022 ETT: Ex time 7:18. Max HR 169. No acute ST/T changes.   Diverticulitis    Family history of adverse reaction to anesthesia    Mother - PONV   Hemorrhoids    History of echocardiogram    a. 04/2020 Echo: EF 60-65%, no rwma, nl RV size/fxn.   Hypertension    Infection    freq UTI   Palpitations    Tobacco abuse    Vaginal  Pap smear, abnormal    f/u wnl   Wears contact lenses    Past Surgical History:  Procedure Laterality Date   COLONOSCOPY WITH PROPOFOL  N/A 04/03/2019   Procedure: COLONOSCOPY WITH PROPOFOL ;  Surgeon: Jinny Carmine, MD;  Location: [facility];  Service: Endoscopy;  Laterality: N/A;  Priority 3   DILATION AND EVACUATION N/A 09/07/2013   Procedure: DILATATION AND EVACUATION;  Surgeon: Marjorie DEL. Okey, MD;  Location: [facility];  Service: Gynecology;  Laterality: N/A;   ESOPHAGOGASTRODUODENOSCOPY (EGD) WITH PROPOFOL  N/A 04/04/2020   Procedure: ESOPHAGOGASTRODUODENOSCOPY (EGD) WITH BIOPSY;  Surgeon: Jinny Carmine, MD;  Location: [facility];  Service: Endoscopy;  Laterality: N/A;   TONSILLECTOMY     Social History   Socioeconomic History   Marital status: Single    Spouse name: Not on file   Number of children: Not on file   Years of education: Not on file   Highest education level: GED or  equivalent  Occupational History   Not on file  Tobacco Use   Smoking status: Some Days    Current packs/day: 0.25    Average packs/day: 0.3 packs/day for 16.0 years (4.0 ttl pk-yrs)    Types: Cigarettes   Smokeless tobacco: Never   Tobacco comments:    since age 63    Smokes 3 cigarettes daily;10/09/2022   Vaping Use   Vaping status: Some Days  Substance and Sexual Activity   Alcohol use: Yes    Alcohol/week: 1.0 standard drink of alcohol    Types: 1 Cans of beer per week    Comment: occasionally   Drug use: No   Sexual activity: Not Currently    Birth control/protection: None  Other Topics Concern   Not on file  Social History Narrative   Not on file   Social Drivers of Health   Tobacco Use: High Risk (12/18/2023)   Patient History    Smoking Tobacco Use: Some Days    Smokeless Tobacco Use: Never    Passive Exposure: Not on file  Financial Resource Strain: Low Risk (11/16/2022)   Overall Financial Resource Strain (CARDIA)    Difficulty of Paying Living Expenses: Not very hard  Food Insecurity: No Food Insecurity (11/16/2022)   Hunger Vital Sign    Worried About Running Out of Food in the Last Year: Never true    Ran Out of Food in the Last Year: Never true  Transportation Needs: No Transportation Needs (11/16/2022)   PRAPARE - Administrator, Civil Service (Medical): No    Lack of Transportation (Non-Medical): No  Physical Activity: Insufficiently Active (11/16/2022)   Exercise Vital Sign    Days of Exercise per Week: 2 days    Minutes of Exercise per Session: 30 min  Stress: No Stress Concern Present (11/16/2022)   [employer] of Occupational Health - Occupational Stress Questionnaire    Feeling of Stress : Only a little  Social Connections: Moderately Integrated (11/16/2022)   Social Connection and Isolation Panel    Frequency of Communication with Friends and Family: More than three times a week    Frequency of Social Gatherings with Friends  and Family: Once a week    Attends Religious Services: More than 4 times per year    Active Member of Clubs or Organizations: Yes    Attends Banker Meetings: More than 4 times per year    Marital Status: Never married  Intimate Partner Violence: Not on file  Depression (PHQ2-9): Medium Risk (12/16/2023)  Depression (PHQ2-9)    PHQ-2 Score: 8  Alcohol Screen: Low Risk (11/16/2022)   Alcohol Screen    Last Alcohol Screening Score (AUDIT): 0  Housing: Unknown (08/12/2023)   Received from [facility]   Epic    Unable to Pay for Housing in the Last Year: Not on file    Number of Times Moved in the Last Year: Not on file    At any time in the past 12 months, were you homeless or living in a shelter (including now)?: Patient unable to answer  Utilities: Not on file  Health Literacy: Not on file   Family Status  Relation Name Status   Mother  Alive   Father  Alive   MGM  (Not Specified)   MGF  Deceased   PGM  (Not Specified)   PGF  (Not Specified)   Other  (Not Specified)   Neg Hx  (Not Specified)  No partnership data on file   Family History  Problem Relation Age of Onset   Hypertension Mother    Clotting disorder Mother    Diabetes Father    Hypertension Father    Stroke Maternal Grandmother    COPD Maternal Grandmother    Hypertension Maternal Grandmother    Heart disease Maternal Grandmother    Heart disease Maternal Grandfather    Heart attack Maternal Grandfather    Arthritis Paternal Grandmother    Alzheimer's disease Paternal Grandmother    Heart disease Paternal Grandfather    Hypertension Other    Cancer Other    Hearing loss Neg Hx    Breast cancer Neg Hx    Allergies[1]  Patient Care Team: Galan Ghee, PA-C as PCP - General (Physician Assistant) Anner Alm ORN, MD as PCP - Cardiology (Cardiology)   Medications: Show/hide medication list[2]  Review of Systems Except see HPI     Objective    BP 132/78 (BP  Location: Left Arm, Patient Position: Sitting, Cuff Size: Normal)   Pulse (!) 55   Temp 98.1 F (36.7 C) (Oral)   Ht 5' 3 (1.6 m)   Wt 231 lb (104.8 kg)   SpO2 98%   BMI 40.92 kg/m    Physical Exam Vitals reviewed.  Constitutional:      General: She is not in acute distress.    Appearance: Normal appearance. She is well-developed. She is not ill-appearing, toxic-appearing or diaphoretic.  HENT:     Head: Normocephalic and atraumatic.     Right Ear: Tympanic membrane, ear canal and external ear normal.     Left Ear: Tympanic membrane, ear canal and external ear normal.     Nose: Nose normal. No congestion or rhinorrhea.     Mouth/Throat:     Mouth: Mucous membranes are moist.     Pharynx: Oropharynx is clear. No oropharyngeal exudate.  Eyes:     General: No scleral icterus.       Right eye: No discharge.        Left eye: No discharge.     Conjunctiva/sclera: Conjunctivae normal.     Pupils: Pupils are equal, round, and reactive to light.  Neck:     Thyroid : No thyromegaly.     Vascular: No carotid bruit.  Cardiovascular:     Rate and Rhythm: Normal rate and regular rhythm.     Pulses: Normal pulses.     Heart sounds: Normal heart sounds. No murmur heard.    No friction rub. No gallop.  Pulmonary:  Effort: Pulmonary effort is normal. No respiratory distress.     Breath sounds: Normal breath sounds. No wheezing or rales.  Abdominal:     General: Abdomen is flat. Bowel sounds are normal. There is no distension.     Palpations: Abdomen is soft. There is no mass.     Tenderness: There is no abdominal tenderness. There is no right CVA tenderness, left CVA tenderness, guarding or rebound.     Hernia: No hernia is present.  Musculoskeletal:        General: No swelling, tenderness, deformity or signs of injury. Normal range of motion.     Cervical back: Normal range of motion and neck supple. No rigidity or tenderness.     Right lower leg: No edema.     Left lower leg: No  edema.  Lymphadenopathy:     Cervical: No cervical adenopathy.  Skin:    General: Skin is warm and dry.     Coloration: Skin is not jaundiced or pale.     Findings: No bruising, erythema, lesion or rash.  Neurological:     Mental Status: She is alert and oriented to person, place, and time. Mental status is at baseline.     Gait: Gait normal.  Psychiatric:        Mood and Affect: Mood normal.        Behavior: Behavior normal.        Thought Content: Thought content normal.        Judgment: Judgment normal.      No results found for any visits on 01/20/24.  Assessment & Plan    Routine Health Maintenance and Physical Exam  Exercise Activities and Dietary recommendations  Goals   None     Immunization History  Administered Date(s) Administered   Hep B, Unspecified 10/12/1997, 11/16/1997, 04/19/1998   Influenza-Unspecified 09/11/2017   PFIZER(Purple Top)SARS-COV-2 Vaccination 01/17/2019, 02/06/2019   Tdap 09/18/2015    Health Maintenance  Topic Date Due   Complete foot exam   Never done   Pneumococcal Vaccine (1 of 2 - PCV) Never done   Eye exam for diabetics  06/12/2020   COVID-19 Vaccine (3 - 2025-26 season) 09/06/2023   Pap with HPV screening  02/21/2024   Flu Shot  04/04/2024*   Hemoglobin A1C  07/04/2024   Yearly kidney function blood test for diabetes  11/28/2024   Yearly kidney health urinalysis for diabetes  01/04/2025   DTaP/Tdap/Td vaccine (2 - Td or Tdap) 09/17/2025   HPV Vaccine (No Doses Required) Completed   Hepatitis C Screening  Completed   HIV Screening  Completed   Meningitis B Vaccine  Aged Out  *Topic was postponed. The date shown is not the original due date.    Discussed health benefits of physical activity, and encouraged her to engage in regular exercise appropriate for her age and condition.  Assessment & Plan Annual physical exam Overall well-being good. Sleep disturbances noted. History of abnormal Pap smear cells, no current  follow-up. Celibate for six years. Regular bowel movements. Left knee pain with overuse. - Referred to OBGYN for Pap smear and potential colposcopy. - Encouraged continued physical activity and stretching for knee pain. - Advised on lifestyle modifications for sleep, including avoiding caffeine , chocolate, and spicy foods before bed.  Things to do to keep yourself healthy  - Exercise at least 30-45 minutes a day, 3-4 days a week.  - Eat a low-fat diet with lots of fruits and vegetables, up to 7-9 servings per  day.  - Seatbelts can save your life. Wear them always.  - Smoke detectors on every level of your home, check batteries every year.  - Eye Doctor - have an eye exam every 1-2 years  - Safe sex - if you may be exposed to STDs, use a condom.  - Alcohol -  If you drink, do it moderately, less than 2 drinks per day.  - Health Care Power of Attorney. Choose someone to speak for you if you are not able.  - Depression is common in our stressful world.If you're feeling down or losing interest in things you normally enjoy, please come in for a visit.  - Violence - If anyone is threatening or hurting you, please call immediately.  Gastroesophageal reflux disease GERD symptoms well-controlled with omeprazole  and lifestyle modifications. - Continue omeprazole   20mg  twice daily. - Maintain lifestyle modifications. Will follow-up  Rectal bleeding Intermittent rectal bleeding, last episode four days ago. Gastroenterology appointment scheduled. - Attend gastroenterology appointment in February.  Asthma Well-controlled with wixela inhaler. Albuterol  used as needed during sports. - Continue wixela inhaler regularly. - Use albuterol  inhaler as needed, especially during sports.  Allergic rhinitis Symptoms affect voice, especially when singing. Current use of antihistamines, not saline spray. Flonase  recommended. - Incorporate saline spray for nasal irrigation. - Use Flonase  for symptom  relief.  Morbid obesity (HCC) Chronic and stable Associated with hld, htn Weight loss of 5% of pt's current weight via healthy diet and daily exercise encouraged. Continue lifestyle modifications Will follow-up  Cervical cancer screening - Ambulatory referral to Obstetrics / Gynecology  Prediabetes Chronic and stable Last A1c of 6/12.31.25 Advised low carb diet and exercise Will follow-up  Hypertension Chronic and borderline stable Continue losartan  25mg , hydrochlorothiazide  12.5 The ASCVD Risk score (Arnett DK, et al., 2019) failed to calculate for the following reasons:   The 2019 ASCVD risk score is only valid for ages 59 to 69 Lp from 01/05/24 showed elevated cholesterol levels Continue lifestyle modifications Will follow-up  Will recheck UA for blood in urine No follow-ups on file.    The patient was advised to call back or seek an in-person evaluation if the symptoms worsen or if the condition fails to improve as anticipated.  I discussed the assessment and treatment plan with the patient. The patient was provided an opportunity to ask questions and all were answered. The patient agreed with the plan and demonstrated an understanding of the instructions.  I, Mordechai Matuszak, PA-C have reviewed all documentation for this visit. The documentation on 01/20/2024  for the exam, diagnosis, procedures, and orders are all accurate and complete.  Jolynn Spencer, [facility], MMS [facility] [phone] (phone) [phone] (fax)  [facility]     [1]  Allergies Allergen Reactions   Ivp Dye [Iodinated Contrast Media] Hives    Possible delayed anaphylaxis on re-eval 08/12/23  [2]  Outpatient Medications Prior to Visit  Medication Sig   albuterol  (VENTOLIN  HFA) 108 (90 Base) MCG/ACT inhaler Inhale 2 puffs into the lungs every 6 (six) hours as needed for wheezing or shortness of breath.   EPINEPHrine  0.3 mg/0.3 mL IJ SOAJ injection Inject 0.3 mg into the  muscle as needed for anaphylaxis.   fluticasone -salmeterol (WIXELA INHUB) 250-50 MCG/ACT AEPB Inhale 1 puff into the lungs in the morning and at bedtime.   hydrochlorothiazide  (MICROZIDE ) 12.5 MG capsule Take 1 capsule (12.5 mg total) by mouth daily.   lidocaine  (LIDODERM ) 5 % Place 1 patch onto the skin daily.  Remove & Discard patch within 12 hours or as directed by MD   losartan  (COZAAR ) 25 MG tablet TAKE ONE TABLET BY MOUTH ONE TIME DAILY   omeprazole  (PRILOSEC ) 20 MG capsule TAKE ONE CAPSULE BY MOUTH ONE TIME DAILY   ondansetron  (ZOFRAN -ODT) 4 MG disintegrating tablet Take 1 tablet (4 mg total) by mouth every 8 (eight) hours as needed for nausea or vomiting.   predniSONE  (DELTASONE ) 20 MG tablet Take 1 tablet (20 mg total) by mouth daily with breakfast.   No facility-administered medications prior to visit.   "

## 2024-01-22 ENCOUNTER — Encounter: Payer: Self-pay | Admitting: Physician Assistant

## 2024-01-22 DIAGNOSIS — Z0000 Encounter for general adult medical examination without abnormal findings: Secondary | ICD-10-CM | POA: Insufficient documentation

## 2024-03-02 ENCOUNTER — Ambulatory Visit: Admitting: Family Medicine

## 2024-07-20 ENCOUNTER — Ambulatory Visit: Admitting: Physician Assistant

## 2025-01-22 ENCOUNTER — Encounter: Admitting: Physician Assistant
